# Patient Record
Sex: Male | Born: 1960 | Race: White | Hispanic: No | Marital: Single | Smoking: Former smoker
Health system: Southern US, Community
[De-identification: ages and names within clinical notes are randomized; demographics above are authoritative.]

## PROBLEM LIST (undated history)

## (undated) DIAGNOSIS — J449 Chronic obstructive pulmonary disease, unspecified: Secondary | ICD-10-CM

## (undated) DIAGNOSIS — I639 Cerebral infarction, unspecified: Secondary | ICD-10-CM

## (undated) DIAGNOSIS — C801 Malignant (primary) neoplasm, unspecified: Secondary | ICD-10-CM

## (undated) DIAGNOSIS — J40 Bronchitis, not specified as acute or chronic: Secondary | ICD-10-CM

## (undated) DIAGNOSIS — K219 Gastro-esophageal reflux disease without esophagitis: Secondary | ICD-10-CM

## (undated) DIAGNOSIS — Z95828 Presence of other vascular implants and grafts: Secondary | ICD-10-CM

---
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
## 2003-06-09 ENCOUNTER — Emergency Department (HOSPITAL_COMMUNITY): Admission: EM | Admit: 2003-06-09 | Discharge: 2003-06-09 | Payer: Self-pay | Admitting: Emergency Medicine

## 2004-08-01 ENCOUNTER — Emergency Department (HOSPITAL_COMMUNITY): Admission: EM | Admit: 2004-08-01 | Discharge: 2004-08-01 | Payer: Self-pay | Admitting: Emergency Medicine

## 2004-08-03 ENCOUNTER — Emergency Department (HOSPITAL_COMMUNITY): Admission: EM | Admit: 2004-08-03 | Discharge: 2004-08-03 | Payer: Self-pay | Admitting: Emergency Medicine

## 2004-09-02 ENCOUNTER — Emergency Department (HOSPITAL_COMMUNITY): Admission: EM | Admit: 2004-09-02 | Discharge: 2004-09-02 | Payer: Self-pay | Admitting: *Deleted

## 2005-01-10 ENCOUNTER — Emergency Department (HOSPITAL_COMMUNITY): Admission: EM | Admit: 2005-01-10 | Discharge: 2005-01-10 | Payer: Self-pay | Admitting: Emergency Medicine

## 2005-01-23 ENCOUNTER — Emergency Department (HOSPITAL_COMMUNITY): Admission: EM | Admit: 2005-01-23 | Discharge: 2005-01-23 | Payer: Self-pay | Admitting: Emergency Medicine

## 2005-02-26 ENCOUNTER — Emergency Department (HOSPITAL_COMMUNITY): Admission: EM | Admit: 2005-02-26 | Discharge: 2005-02-27 | Payer: Self-pay | Admitting: *Deleted

## 2005-03-21 ENCOUNTER — Emergency Department (HOSPITAL_COMMUNITY): Admission: EM | Admit: 2005-03-21 | Discharge: 2005-03-21 | Payer: Self-pay | Admitting: Emergency Medicine

## 2005-03-22 ENCOUNTER — Emergency Department (HOSPITAL_COMMUNITY): Admission: EM | Admit: 2005-03-22 | Discharge: 2005-03-22 | Payer: Self-pay | Admitting: Emergency Medicine

## 2005-07-16 ENCOUNTER — Ambulatory Visit (HOSPITAL_COMMUNITY): Admission: RE | Admit: 2005-07-16 | Discharge: 2005-07-16 | Payer: Self-pay | Admitting: Orthopaedic Surgery

## 2005-12-24 ENCOUNTER — Ambulatory Visit (HOSPITAL_COMMUNITY): Admission: RE | Admit: 2005-12-24 | Discharge: 2005-12-24 | Payer: Self-pay | Admitting: Family Medicine

## 2006-02-24 ENCOUNTER — Emergency Department (HOSPITAL_COMMUNITY): Admission: EM | Admit: 2006-02-24 | Discharge: 2006-02-24 | Payer: Self-pay | Admitting: Emergency Medicine

## 2006-06-05 ENCOUNTER — Emergency Department (HOSPITAL_COMMUNITY): Admission: EM | Admit: 2006-06-05 | Discharge: 2006-06-05 | Payer: Self-pay | Admitting: Emergency Medicine

## 2006-09-24 ENCOUNTER — Ambulatory Visit (HOSPITAL_COMMUNITY): Admission: RE | Admit: 2006-09-24 | Discharge: 2006-09-24 | Payer: Self-pay | Admitting: Family Medicine

## 2006-10-06 ENCOUNTER — Emergency Department (HOSPITAL_COMMUNITY): Admission: EM | Admit: 2006-10-06 | Discharge: 2006-10-07 | Payer: Self-pay | Admitting: Emergency Medicine

## 2006-11-05 ENCOUNTER — Emergency Department (HOSPITAL_COMMUNITY): Admission: EM | Admit: 2006-11-05 | Discharge: 2006-11-05 | Payer: Self-pay | Admitting: Emergency Medicine

## 2006-12-01 ENCOUNTER — Emergency Department (HOSPITAL_COMMUNITY): Admission: EM | Admit: 2006-12-01 | Discharge: 2006-12-01 | Payer: Self-pay | Admitting: Emergency Medicine

## 2013-01-30 ENCOUNTER — Other Ambulatory Visit (HOSPITAL_COMMUNITY): Payer: Self-pay | Admitting: Family Medicine

## 2013-01-30 DIAGNOSIS — R319 Hematuria, unspecified: Secondary | ICD-10-CM

## 2013-01-30 DIAGNOSIS — R109 Unspecified abdominal pain: Secondary | ICD-10-CM

## 2013-01-30 DIAGNOSIS — R1030 Lower abdominal pain, unspecified: Secondary | ICD-10-CM

## 2013-01-31 ENCOUNTER — Ambulatory Visit (HOSPITAL_COMMUNITY)
Admission: RE | Admit: 2013-01-31 | Discharge: 2013-01-31 | Disposition: A | Discharge status: Home / Self Care | Payer: BC Managed Care – PPO | Source: Ambulatory Visit | Attending: Family Medicine | Admitting: Family Medicine

## 2013-01-31 DIAGNOSIS — N200 Calculus of kidney: Secondary | ICD-10-CM | POA: Insufficient documentation

## 2013-01-31 DIAGNOSIS — R319 Hematuria, unspecified: Secondary | ICD-10-CM

## 2013-01-31 DIAGNOSIS — R109 Unspecified abdominal pain: Secondary | ICD-10-CM | POA: Insufficient documentation

## 2013-01-31 DIAGNOSIS — R1030 Lower abdominal pain, unspecified: Secondary | ICD-10-CM

## 2013-03-26 ENCOUNTER — Emergency Department (HOSPITAL_COMMUNITY)
Admission: EM | Admit: 2013-03-26 | Discharge: 2013-03-26 | Disposition: A | Discharge status: Home / Self Care | Payer: BC Managed Care – PPO | Attending: Emergency Medicine | Admitting: Emergency Medicine

## 2013-03-26 ENCOUNTER — Encounter (HOSPITAL_COMMUNITY): Payer: Self-pay

## 2013-03-26 DIAGNOSIS — R51 Headache: Secondary | ICD-10-CM | POA: Insufficient documentation

## 2013-03-26 DIAGNOSIS — J3489 Other specified disorders of nose and nasal sinuses: Secondary | ICD-10-CM | POA: Insufficient documentation

## 2013-03-26 DIAGNOSIS — R059 Cough, unspecified: Secondary | ICD-10-CM | POA: Insufficient documentation

## 2013-03-26 DIAGNOSIS — F17200 Nicotine dependence, unspecified, uncomplicated: Secondary | ICD-10-CM | POA: Insufficient documentation

## 2013-03-26 DIAGNOSIS — R05 Cough: Secondary | ICD-10-CM | POA: Insufficient documentation

## 2013-03-26 MED ORDER — PSEUDOEPHEDRINE HCL 60 MG PO TABS: 60.0000 mg | ORAL_TABLET | Freq: Four times a day (QID) | ORAL | Status: DC | PRN

## 2013-03-26 MED ORDER — PREDNISONE 10 MG PO TABS: ORAL_TABLET | ORAL | Status: DC

## 2013-03-26 MED ORDER — KETOROLAC TROMETHAMINE 10 MG PO TABS
10.0000 mg | ORAL_TABLET | Freq: Once | ORAL | Status: AC
  Administered 2013-03-26: 10 mg via ORAL
  Filled 2013-03-26: qty 1

## 2013-03-26 MED ORDER — PSEUDOEPHEDRINE HCL 60 MG PO TABS
60.0000 mg | ORAL_TABLET | Freq: Once | ORAL | Status: AC
  Administered 2013-03-26: 60 mg via ORAL
  Filled 2013-03-26: qty 1

## 2013-03-26 MED ORDER — HYDROCODONE-ACETAMINOPHEN 7.5-325 MG PO TABS: 1.0000 | ORAL_TABLET | ORAL | Status: DC | PRN

## 2013-03-26 MED ORDER — PREDNISONE 50 MG PO TABS
60.0000 mg | ORAL_TABLET | Freq: Once | ORAL | Status: AC
  Administered 2013-03-26: 60 mg via ORAL
  Filled 2013-03-26: qty 1

## 2013-03-26 NOTE — ED Provider Notes (Signed)
History     CSN: [id number]  Arrival date & time 03/26/13  0814   First MD Initiated Contact with Patient 03/26/13 [phone]      Chief Complaint  Patient presents with  . Headache    (Consider location/radiation/quality/duration/timing/severity/associated sxs/prior treatment) Patient is a 52 y.o. male presenting with headaches. The history is provided by the patient.  Headache Pain location:  Frontal Quality:  Dull Radiates to:  Does not radiate Severity currently:  8/10 Severity at highest:  8/10 Onset quality:  Gradual Duration:  2 days Timing:  Intermittent Progression:  Worsening Chronicity:  New Similar to prior headaches: yes   Context comment:  Change in temperature Relieved by:  Nothing Worsened by:  Nothing tried Ineffective treatments:  NSAIDs Associated symptoms: congestion and cough   Associated symptoms: no abdominal pain, no back pain, no dizziness, no fever, no loss of balance, no near-syncope, no neck pain, no photophobia and no seizures   Risk factors: no family hx of SAH and lifestyle not sedentary     History reviewed. No pertinent past medical history.  History reviewed. No pertinent past surgical history.  No family history on file.  History  Substance Use Topics  . Smoking status: Current Every Day Smoker  . Smokeless tobacco: Not on file  . Alcohol Use: No      Review of Systems  Constitutional: Negative for fever and activity change.       All ROS Neg except as noted in HPI  HENT: Positive for congestion. Negative for nosebleeds and neck pain.   Eyes: Negative for photophobia and discharge.  Respiratory: Positive for cough. Negative for shortness of breath and wheezing.   Cardiovascular: Negative for chest pain, palpitations and near-syncope.  Gastrointestinal: Negative for abdominal pain and blood in stool.  Genitourinary: Negative for dysuria, frequency and hematuria.  Musculoskeletal: Negative for back pain and arthralgias.  Skin:  Negative.   Neurological: Positive for headaches. Negative for dizziness, seizures, speech difficulty and loss of balance.  Psychiatric/Behavioral: Negative for hallucinations and confusion.    Allergies  Review of patient's allergies indicates no known allergies.  Home Medications  No current outpatient prescriptions on file.  BP 99/85  Pulse 111  Temp(Src) 97.7 F (36.5 C) (Oral)  Resp 18  Ht 5\' 9"  (1.753 m)  Wt 125 lb (56.7 kg)  BMI 18.45 kg/m2  SpO2 99%  Physical Exam  Nursing note and vitals reviewed. Constitutional: He is oriented to person, place, and time. He appears well-developed and well-nourished.  Non-toxic appearance.  HENT:  Head: Normocephalic.  Right Ear: Tympanic membrane and external ear normal.  Left Ear: Tympanic membrane and external ear normal.  Mouth/Throat: Oropharynx is clear and moist.  Nasal congestion noted. Pain to percussion of the sinuses. No hot areas noted.  Eyes: EOM and lids are normal. Pupils are equal, round, and reactive to light. Right eye exhibits no discharge. Left eye exhibits no discharge.  Neck: Normal range of motion. Neck supple. Carotid bruit is not present.  Cardiovascular: Normal rate, regular rhythm, normal heart sounds, intact distal pulses and normal pulses.   Pulmonary/Chest: Breath sounds normal. No respiratory distress.  Abdominal: Soft. Bowel sounds are normal. There is no tenderness. There is no guarding.  Musculoskeletal: Normal range of motion.  Lymphadenopathy:       Head (right side): No submandibular adenopathy present.       Head (left side): No submandibular adenopathy present.    He has no cervical adenopathy.  Neurological: He  is alert and oriented to person, place, and time. He has normal strength. No cranial nerve deficit or sensory deficit.  Skin: Skin is warm and dry.  Psychiatric: He has a normal mood and affect. His speech is normal.    ED Course  Procedures (including critical care time)  Labs  Reviewed - No data to display No results found.   No diagnosis found.    MDM  I have reviewed nursing notes, vital signs, and all appropriate lab and imaging results for this patient. Pt presents to ED with 2 days of frontal headache. Has hx of sinus related problems. C/o pressure sensation. No blurred vision, double vision, facial weakness, N/V, change in gait or near syncope, or LOC. Exam reveal pain to percussion of the sinuses. No gross neuro deficit. Plan - Prednisone, sudafed, and Rx for Norco for pain. Pt referred to Dr Suszanne Conners if not improving.       Kathie Dike, PA-C 03/26/13 [phone]

## 2013-03-26 NOTE — ED Notes (Signed)
Pt c/o sinus pressure x 2 days.  

## 2013-03-26 NOTE — ED Provider Notes (Signed)
Medical screening examination/treatment/procedure(s) were performed by non-physician practitioner and as supervising physician I was immediately available for consultation/collaboration.   Glynn Octave, MD 03/26/13 [phone]

## 2013-04-29 ENCOUNTER — Encounter (HOSPITAL_COMMUNITY): Payer: Self-pay | Admitting: *Deleted

## 2013-04-29 ENCOUNTER — Emergency Department (HOSPITAL_COMMUNITY)
Admission: EM | Admit: 2013-04-29 | Discharge: 2013-04-29 | Disposition: A | Discharge status: Home / Self Care | Payer: BC Managed Care – PPO | Attending: Emergency Medicine | Admitting: Emergency Medicine

## 2013-04-29 ENCOUNTER — Emergency Department (HOSPITAL_COMMUNITY): Payer: BC Managed Care – PPO

## 2013-04-29 DIAGNOSIS — F17200 Nicotine dependence, unspecified, uncomplicated: Secondary | ICD-10-CM | POA: Insufficient documentation

## 2013-04-29 DIAGNOSIS — R51 Headache: Secondary | ICD-10-CM | POA: Insufficient documentation

## 2013-04-29 DIAGNOSIS — Y9389 Activity, other specified: Secondary | ICD-10-CM | POA: Insufficient documentation

## 2013-04-29 DIAGNOSIS — Y9289 Other specified places as the place of occurrence of the external cause: Secondary | ICD-10-CM | POA: Insufficient documentation

## 2013-04-29 DIAGNOSIS — IMO0002 Reserved for concepts with insufficient information to code with codable children: Secondary | ICD-10-CM | POA: Insufficient documentation

## 2013-04-29 DIAGNOSIS — Z880 Allergy status to penicillin: Secondary | ICD-10-CM | POA: Insufficient documentation

## 2013-04-29 DIAGNOSIS — S62609B Fracture of unspecified phalanx of unspecified finger, initial encounter for open fracture: Secondary | ICD-10-CM

## 2013-04-29 DIAGNOSIS — Z79899 Other long term (current) drug therapy: Secondary | ICD-10-CM | POA: Insufficient documentation

## 2013-04-29 DIAGNOSIS — W2209XA Striking against other stationary object, initial encounter: Secondary | ICD-10-CM | POA: Insufficient documentation

## 2013-04-29 MED ORDER — IBUPROFEN 800 MG PO TABS
800.0000 mg | ORAL_TABLET | Freq: Once | ORAL | Status: AC
  Administered 2013-04-29: 800 mg via ORAL
  Filled 2013-04-29: qty 1

## 2013-04-29 MED ORDER — HYDROCODONE-ACETAMINOPHEN 5-325 MG PO TABS: 1.0000 | ORAL_TABLET | ORAL | Status: DC | PRN

## 2013-04-29 MED ORDER — HYDROCODONE-ACETAMINOPHEN 5-325 MG PO TABS
1.0000 | ORAL_TABLET | Freq: Once | ORAL | Status: AC
Start: 2013-04-29 — End: 2013-04-29
  Administered 2013-04-29: 1 via ORAL
  Filled 2013-04-29: qty 1

## 2013-04-29 MED ORDER — SULFAMETHOXAZOLE-TRIMETHOPRIM 800-160 MG PO TABS: 1.0000 | ORAL_TABLET | Freq: Two times a day (BID) | ORAL | Status: AC

## 2013-04-29 NOTE — ED Notes (Signed)
Pt was putting shingles on a roof and hit his left 5th digit with a hammer. Bruising, swelling to left finger along with small lac to underside of finger. NAD

## 2013-04-29 NOTE — ED Provider Notes (Signed)
History    CSN: [id number] Arrival date & time 04/29/13  0136  First MD Initiated Contact with Patient 04/29/13 0150     Chief Complaint  Patient presents with  . Headache   (Consider location/radiation/quality/duration/timing/severity/associated sxs/prior Treatment) HPI HPI Comments: Paul Thornton is a 52 y.o. male who presents to the Emergency Department complaining of a frontal headache that began at 2300 when he woke up. He took an aspirin without relief. He has had similar headaches in the past, last seen here on 03/26/13. He has not followed up with an ENT.  PCP Dr. Renard Matter  History reviewed. No pertinent past medical history. History reviewed. No pertinent past surgical history. No family history on file. History  Substance Use Topics  . Smoking status: Current Every Day Smoker  . Smokeless tobacco: Not on file  . Alcohol Use: No    Review of Systems  Constitutional: Negative for fever.       10 Systems reviewed and are negative for acute change except as noted in the HPI.  HENT: Negative for congestion.   Eyes: Negative for discharge and redness.  Respiratory: Negative for cough and shortness of breath.   Cardiovascular: Negative for chest pain.  Gastrointestinal: Negative for vomiting and abdominal pain.  Musculoskeletal: Negative for back pain.  Skin: Negative for rash.  Neurological: Positive for headaches. Negative for syncope and numbness.  Psychiatric/Behavioral:       No behavior change.    Allergies  Penicillins  Home Medications   Current Outpatient Rx  Name  Route  Sig  Dispense  Refill  . ALPRAZolam (XANAX) 1 MG tablet   Oral   Take 1 mg by mouth as needed for sleep.         Marland Kitchen HYDROcodone-acetaminophen (NORCO) 7.5-325 MG per tablet   Oral   Take 1 tablet by mouth every 4 (four) hours as needed for pain.   20 tablet   0   . predniSONE (DELTASONE) 10 MG tablet      5,4,3,2,1 - Take with food   15 tablet   0   . pseudoephedrine  (SUDAFED) 60 MG tablet   Oral   Take 1 tablet (60 mg total) by mouth every 6 (six) hours as needed for congestion.   30 tablet   0    There were no vitals taken for this visit. Physical Exam  Nursing note and vitals reviewed. Constitutional: He appears well-developed and well-nourished.  Awake, alert, nontoxic appearance.  HENT:  Head: Normocephalic and atraumatic.  Right Ear: External ear normal.  Left Ear: External ear normal.  Eyes: EOM are normal. Pupils are equal, round, and reactive to light.  Neck: Normal range of motion. Neck supple.  Cardiovascular: Normal rate and intact distal pulses.   Pulmonary/Chest: Effort normal and breath sounds normal. He exhibits no tenderness.  Abdominal: Soft. Bowel sounds are normal. There is no tenderness. There is no rebound.  Musculoskeletal: He exhibits no tenderness.  Baseline ROM, no obvious new focal weakness.  Neurological:  Mental status and motor strength appears baseline for patient and situation.  Skin: No rash noted.  Psychiatric: He has a normal mood and affect.    ED Course  Procedures (including critical care time) Medications  HYDROcodone-acetaminophen (NORCO/VICODIN) 5-325 MG per tablet 1 tablet (1 tablet Oral Given 04/29/13 0156)  ibuprofen (ADVIL,MOTRIN) tablet 800 mg (800 mg Oral Given 04/29/13 0156)     0246 Headache has resolved.  MDM  Patient presents with a headache that  began at 2300 and has not responded to aspirin. Given ibuprofen and Vicodin with relief. Pt stable in ED with no significant deterioration in condition.The patient appears reasonably screened and/or stabilized for discharge and I doubt any other medical condition or other [facility] requiring further screening, evaluation, or treatment in the ED at this time prior to discharge.  MDM Reviewed: nursing note and vitals     Nicoletta Dress. Colon Branch, MD 04/29/13 [phone]

## 2013-04-29 NOTE — ED Notes (Signed)
Pt states was woke w/ a headache about 2300 yesterday. Has not taken anything for before coming to the ER.

## 2013-04-30 NOTE — ED Provider Notes (Signed)
History    CSN: [id number] Arrival date & time 04/29/13  1228  First MD Initiated Contact with Patient 04/29/13 1257     Chief Complaint  Patient presents with  . Hand Injury   (Consider location/radiation/quality/duration/timing/severity/associated sxs/prior Treatment) Patient is a 52 y.o. male presenting with hand injury. The history is provided by the patient.  Hand Injury Location:  Finger Time since incident:  1 hour Injury: yes   Mechanism of injury comment:  Direct blow by hammer as he was nailing wooden shingles on a roof. Finger location:  L little finger Pain details:    Quality:  Aching   Radiates to:  Does not radiate   Onset quality:  Sudden   Timing:  Constant   Progression:  Unchanged Chronicity:  New Handedness:  Right-handed Dislocation: no   Foreign body present:  No foreign bodies Tetanus status:  Up to date Relieved by:  Nothing Worsened by:  Movement Ineffective treatments:  None tried Associated symptoms: decreased range of motion, swelling and tingling   Associated symptoms: no fever and no numbness   Associated symptoms comment:  He has deep abrasion on the volar side of his 5th finger at the pip joint,  Hemostatic at this time.  History reviewed. No pertinent past medical history. History reviewed. No pertinent past surgical history. No family history on file. History  Substance Use Topics  . Smoking status: Current Some Day Smoker  . Smokeless tobacco: Not on file  . Alcohol Use: No    Review of Systems  Constitutional: Negative for fever.  Musculoskeletal: Positive for joint swelling and arthralgias. Negative for myalgias.  Skin: Positive for wound.  Neurological: Negative for weakness and numbness.    Allergies  Penicillins  Home Medications   Current Outpatient Rx  Name  Route  Sig  Dispense  Refill  . ALPRAZolam (XANAX) 1 MG tablet   Oral   Take 0.5-1 mg by mouth at bedtime as needed for sleep.          Marland Kitchen  HYDROcodone-acetaminophen (NORCO/VICODIN) 5-325 MG per tablet   Oral   Take 1 tablet by mouth every 4 (four) hours as needed for pain.   20 tablet   0   . sulfamethoxazole-trimethoprim (BACTRIM DS,SEPTRA DS) 800-160 MG per tablet   Oral   Take 1 tablet by mouth 2 (two) times daily.   14 tablet   0    BP 98/69  Pulse 80  Temp(Src) 97.5 F (36.4 C) (Oral)  Resp 20  Ht 5\' 9"  (1.753 m)  Wt 130 lb (58.968 kg)  BMI 19.19 kg/m2  SpO2 97% Physical Exam  Constitutional: He appears well-developed and well-nourished.  HENT:  Head: Atraumatic.  Neck: Normal range of motion.  Cardiovascular:  Pulses equal bilaterally  Musculoskeletal: He exhibits tenderness.       Left hand: He exhibits tenderness and swelling. He exhibits normal capillary refill and no deformity. Decreased strength noted. He exhibits finger abduction.  Swelling,  Ecchymosis proximal phalanx left 5th finger.  There is a small deep abrasion, 3 mm volar pip joint.  No fb.  Distal cap refill less than 3 sec.  Pt can flex distal finger, but painful.  Decreased sensation to fine touch distally, but present.  Neurological: He is alert. He has normal strength. He displays normal reflexes. No sensory deficit.  Equal strength  Skin: Skin is warm and dry.  Psychiatric: He has a normal mood and affect.    ED Course  Procedures (  including critical care time) Labs Reviewed - No data to display Dg Finger Little Left  04/29/2013   *RADIOLOGY REPORT*  Clinical Data: Fifth digit pain, injury with hammer  LEFT LITTLE FINGER 2+V  Comparison: None.  Findings: Comminuted, nondisplaced fracture involving the fifth middle phalanx.  No definite interarticular extension.  Associated soft tissue swelling overlying the PIP joint.  IMPRESSION: Comminuted, nondisplaced fracture involving the fifth middle phalanx.   Original Report Authenticated By: Charline Bills, M.D.   1. Phalanx, hand fracture, open, initial encounter     MDM  Patients  labs and/or radiological studies were viewed and considered during the medical decision making and disposition process. Discussed with Dr. Clarene Duke prior to pt dispo home.  Don't believe this is truly an open fracture,  Skin injury is negligible.  Will cover for this possibility however,  Placed on bactrim,  Also prescribed hydrocodone,  Ice , elevation,  Recheck by Dr. Hilda Lias on Monday.  No ortho on call here this weekend,  Also given Dr. Debby Bud number for referral if Dr. Hilda Lias not available Monday,  But pt has seen him before,  And prefers to f/u with him.  Wound was cleaned,  telfa dressing and cling applied,  Finger splint and buddy tape to the 4th finger to protect the bony injury.  Tetanus is utd.  Wound care instructions given.  Return here  for any signs of infection including redness, swelling, worse pain or drainage of pus.     Burgess Amor, PA-C 04/30/13 0900

## 2013-05-02 NOTE — ED Provider Notes (Signed)
Medical screening examination/treatment/procedure(s) were performed by non-physician practitioner and as supervising physician I was immediately available for consultation/collaboration.   Laray Anger, DO 05/02/13 1845

## 2013-08-20 ENCOUNTER — Encounter (HOSPITAL_COMMUNITY): Payer: Self-pay | Admitting: Emergency Medicine

## 2013-08-20 ENCOUNTER — Emergency Department (HOSPITAL_COMMUNITY)
Admission: EM | Admit: 2013-08-20 | Discharge: 2013-08-20 | Disposition: A | Discharge status: Home / Self Care | Payer: BC Managed Care – PPO | Attending: Emergency Medicine | Admitting: Emergency Medicine

## 2013-08-20 DIAGNOSIS — Z23 Encounter for immunization: Secondary | ICD-10-CM | POA: Insufficient documentation

## 2013-08-20 DIAGNOSIS — Y939 Activity, unspecified: Secondary | ICD-10-CM | POA: Insufficient documentation

## 2013-08-20 DIAGNOSIS — Z880 Allergy status to penicillin: Secondary | ICD-10-CM | POA: Insufficient documentation

## 2013-08-20 DIAGNOSIS — Y929 Unspecified place or not applicable: Secondary | ICD-10-CM | POA: Insufficient documentation

## 2013-08-20 DIAGNOSIS — W268XXA Contact with other sharp object(s), not elsewhere classified, initial encounter: Secondary | ICD-10-CM | POA: Insufficient documentation

## 2013-08-20 DIAGNOSIS — S60459A Superficial foreign body of unspecified finger, initial encounter: Secondary | ICD-10-CM | POA: Insufficient documentation

## 2013-08-20 DIAGNOSIS — F17200 Nicotine dependence, unspecified, uncomplicated: Secondary | ICD-10-CM | POA: Insufficient documentation

## 2013-08-20 MED ORDER — LIDOCAINE HCL (PF) 1 % IJ SOLN
INTRAMUSCULAR | Status: AC
  Administered 2013-08-20: 12:00:00
  Filled 2013-08-20: qty 5

## 2013-08-20 MED ORDER — CEPHALEXIN 500 MG PO CAPS: ORAL_CAPSULE | ORAL | Status: DC

## 2013-08-20 MED ORDER — TETANUS-DIPHTH-ACELL PERTUSSIS 5-2.5-18.5 LF-MCG/0.5 IM SUSP
0.5000 mL | Freq: Once | INTRAMUSCULAR | Status: AC
  Administered 2013-08-20: 0.5 mL via INTRAMUSCULAR
  Filled 2013-08-20: qty 0.5

## 2013-08-20 MED ORDER — HYDROCODONE-ACETAMINOPHEN 5-325 MG PO TABS: 2.0000 | ORAL_TABLET | ORAL | Status: DC | PRN

## 2013-08-20 NOTE — ED Notes (Signed)
Pt has fish hook stuck in left index finger. Nad. No s/s of pain at this time unless touched. rom wnl. Color wnl.

## 2013-08-20 NOTE — ED Provider Notes (Signed)
CSN: [id number]     Arrival date & time 08/20/13  1108 History  This chart was scribed for Hurman Horn, MD by Blanchard Kelch, ED Scribe. The patient was seen in room APFT23/APFT23. Patient's care was started at 12:04 PM.     Chief Complaint  Patient presents with  . Puncture Wound    The history is provided by the patient. No language interpreter was used.    HPI Comments: Paul Thornton is a 52 y.o. male who presents to the Emergency Department due to a fish hook stuck in his left index finger that occurred just prior to arrival. He has constant, stinging pain to the area onset immediately after the accident.       History reviewed. No pertinent past medical history. History reviewed. No pertinent past surgical history. History reviewed. No pertinent family history. History  Substance Use Topics  . Smoking status: Current Some Day Smoker  . Smokeless tobacco: Not on file  . Alcohol Use: No    Review of Systems 10 Systems reviewed and are negative for acute change except as noted in the HPI  Allergies  Amoxicillin and Penicillins  Home Medications   Current Outpatient Rx  Name  Route  Sig  Dispense  Refill  . ALPRAZolam (XANAX) 1 MG tablet   Oral   Take 0.5-1 mg by mouth at bedtime as needed for sleep.          . cephALEXin (KEFLEX) 500 MG capsule      2 caps po bid x 7 days   28 capsule   0   . HYDROcodone-acetaminophen (NORCO) 5-325 MG per tablet   Oral   Take 2 tablets by mouth every 4 (four) hours as needed for pain.   6 tablet   0    Triage Vitals: BP 110/65  Pulse 91  Temp(Src) 97.5 F (36.4 C) (Oral)  Resp 22  Ht 5\' 9"  (1.753 m)  Wt 112 lb (50.803 kg)  BMI 16.53 kg/m2  SpO2 100%  Physical Exam  Nursing note and vitals reviewed. Constitutional:  Awake, alert, nontoxic appearance.  HENT:  Head: Atraumatic.  Eyes: Right eye exhibits no discharge. Left eye exhibits no discharge.  Neck: Neck supple.  Pulmonary/Chest: Effort normal. He  exhibits no tenderness.  Abdominal: Soft. There is no tenderness. There is no rebound.  Musculoskeletal: He exhibits tenderness.  Baseline ROM, no obvious new focal weakness. Left index finger volar pad impaled with fish hook. CR < 2 sec and 2 PDI, FROM. Intact FDP/extension.    Neurological:  Mental status and motor strength appears baseline for patient and situation.  Skin: No rash noted.  Psychiatric: He has a normal mood and affect.    ED Course  Procedures (including critical care time) FB removal left index finger distal volar pad: Timeout taken, wounds cleansed with Chlorprep and Safclens, 1% lidocaine, local 2ml. Fish hook removed via pushing garbage tip through skin then snipping off the barbed tip with wire cutters, then backing out remainder of the fishhook, patient tolerated procedure well without apparent immediate complications. DIAGNOSTIC STUDIES: Oxygen Saturation is 100% on room air, normal by my interpretation.    COORDINATION OF CARE: 12:10 PM -Removed hook from patient's finger. Discussed reasons to return for recheck. Will discharge. Patient verbalizes understanding and agrees with treatment plan.    Labs Review Labs Reviewed - No data to display Imaging Review No results found.  EKG Interpretation   None  MDM   1. Foreign body of finger of left hand, initial encounter    I personally performed the services described in this documentation, which was scribed in my presence. The recorded information has been reviewed and is accurate.    Hurman Horn, MD 08/27/13 2105

## 2014-01-28 ENCOUNTER — Emergency Department (HOSPITAL_COMMUNITY): Payer: BC Managed Care – PPO

## 2014-01-28 ENCOUNTER — Encounter (HOSPITAL_COMMUNITY): Payer: Self-pay | Admitting: Emergency Medicine

## 2014-01-28 ENCOUNTER — Emergency Department (HOSPITAL_COMMUNITY)
Admission: EM | Admit: 2014-01-28 | Discharge: 2014-01-28 | Disposition: A | Discharge status: Home / Self Care | Payer: BC Managed Care – PPO | Attending: Emergency Medicine | Admitting: Emergency Medicine

## 2014-01-28 DIAGNOSIS — J441 Chronic obstructive pulmonary disease with (acute) exacerbation: Secondary | ICD-10-CM | POA: Insufficient documentation

## 2014-01-28 DIAGNOSIS — F17200 Nicotine dependence, unspecified, uncomplicated: Secondary | ICD-10-CM | POA: Insufficient documentation

## 2014-01-28 DIAGNOSIS — Z79899 Other long term (current) drug therapy: Secondary | ICD-10-CM | POA: Insufficient documentation

## 2014-01-28 DIAGNOSIS — R079 Chest pain, unspecified: Secondary | ICD-10-CM | POA: Insufficient documentation

## 2014-01-28 DIAGNOSIS — Z880 Allergy status to penicillin: Secondary | ICD-10-CM | POA: Insufficient documentation

## 2014-01-28 DIAGNOSIS — J449 Chronic obstructive pulmonary disease, unspecified: Secondary | ICD-10-CM

## 2014-01-28 HISTORY — DX: Bronchitis, not specified as acute or chronic: J40

## 2014-01-28 MED ORDER — IPRATROPIUM BROMIDE 0.02 % IN SOLN: 0.5000 mg | Freq: Once | RESPIRATORY_TRACT | Status: DC

## 2014-01-28 MED ORDER — IPRATROPIUM-ALBUTEROL 0.5-2.5 (3) MG/3ML IN SOLN: 3.0000 mL | RESPIRATORY_TRACT | Status: DC

## 2014-01-28 MED ORDER — ALBUTEROL SULFATE HFA 108 (90 BASE) MCG/ACT IN AERS: 1.0000 | INHALATION_SPRAY | Freq: Four times a day (QID) | RESPIRATORY_TRACT | Status: DC | PRN

## 2014-01-28 MED ORDER — ALBUTEROL SULFATE (2.5 MG/3ML) 0.083% IN NEBU: 2.5000 mg | INHALATION_SOLUTION | Freq: Once | RESPIRATORY_TRACT | Status: DC

## 2014-01-28 MED ORDER — PREDNISONE 50 MG PO TABS
60.0000 mg | ORAL_TABLET | Freq: Once | ORAL | Status: AC
  Administered 2014-01-28: 60 mg via ORAL
  Filled 2014-01-28 (×2): qty 1

## 2014-01-28 MED ORDER — IPRATROPIUM-ALBUTEROL 0.5-2.5 (3) MG/3ML IN SOLN
3.0000 mL | Freq: Once | RESPIRATORY_TRACT | Status: AC
  Administered 2014-01-28: 3 mL via RESPIRATORY_TRACT
  Filled 2014-01-28: qty 3

## 2014-01-28 MED ORDER — PREDNISONE 50 MG PO TABS: ORAL_TABLET | ORAL | Status: DC

## 2014-01-28 MED ORDER — ALBUTEROL SULFATE HFA 108 (90 BASE) MCG/ACT IN AERS
2.0000 | INHALATION_SPRAY | Freq: Once | RESPIRATORY_TRACT | Status: DC
  Filled 2014-01-28: qty 6.7

## 2014-01-28 NOTE — ED Notes (Signed)
Coughing x 1 week w/escalation of symptoms this evening.  SOB w/rest and pain in lower thoracic region of back worsening w/deep breathing. Decreased energy over last 2 days.

## 2014-01-28 NOTE — ED Provider Notes (Signed)
CSN: [id number]     Arrival date & time 01/28/14  2147 History  This chart was scribed for Paul Cable, MD by Jenne Campus, ED Scribe. This patient was seen in room APA11/APA11 and the patient's care was started at 10:17 PM.   Chief Complaint  Patient presents with  . Shortness of Breath  . Cough    Patient is a 53 y.o. male presenting with cough. The history is provided by the patient. No language interpreter was used.  Cough Cough characteristics:  Non-productive Severity:  Moderate Onset quality:  Gradual Duration:  3 days Progression:  Worsening Chronicity:  New Smoker: yes   Associated symptoms: chest pain and shortness of breath     HPI Comments: Paul Thornton is a 53 y.o. male who presents to the Emergency Department complaining of NP coughing with associated SOB for the past 3 days. He also reports burning CP from coughing. The symptoms became worse this evening around 5 PM this evening causing him concern. He has taken OTC medications like Alka-Seltzer plus without improvement. He denies any abdominal pain or leg swelling as associated symptoms. He denies any history of chronic medical conditions or being on any daily medications. Pt is a current everyday smoker.   Past Medical History  Diagnosis Date  . Bronchitis    History reviewed. No pertinent past surgical history. History reviewed. No pertinent family history. History  Substance Use Topics  . Smoking status: Current Every Day Smoker -- 0.25 packs/day  . Smokeless tobacco: Not on file  . Alcohol Use: No    Review of Systems  Respiratory: Positive for cough and shortness of breath.   Cardiovascular: Positive for chest pain. Negative for leg swelling.  Gastrointestinal: Negative for abdominal pain.  All other systems reviewed and are negative.   Allergies  Amoxicillin and Penicillins  Home Medications   Current Outpatient Rx  Name  Route  Sig  Dispense  Refill  . ALPRAZolam (XANAX) 1 MG  tablet   Oral   Take 0.5-1 mg by mouth daily as needed for anxiety or sleep.          Marland Kitchen Phenyleph-CPM-DM-Aspirin (ALKA-SELTZER PLUS COLD & COUGH) 7.05-27-09-325 MG TBEF   Oral   Take 1 tablet by mouth daily as needed (for cold symptoms).          Triage Vitals: BP 126/67  Pulse 71  Temp(Src) 97.9 F (36.6 C) (Oral)  Resp 24  Ht 5\' 9"  (1.753 m)  Wt 120 lb (54.432 kg)  BMI 17.71 kg/m2  SpO2 100%  Physical Exam  Nursing note and vitals reviewed.  CONSTITUTIONAL: Well developed/well nourished HEAD: Normocephalic/atraumatic EYES: EOMI/PERRL ENMT: Mucous membranes moist NECK: supple no meningeal signs SPINE:entire spine nontender CV: S1/S2 noted, no murmurs/rubs/gallops noted LUNGS: wheezing bilaterally, no apparent distress ABDOMEN: soft, nontender, no rebound or guarding GU:no cva tenderness NEURO: Pt is awake/alert, moves all extremitiesx4 EXTREMITIES: pulses normal, full ROM, no LE edema SKIN: warm, color normal PSYCH: no abnormalities of mood noted  ED Course  Procedures   Medications  albuterol (PROVENTIL HFA;VENTOLIN HFA) 108 (90 BASE) MCG/ACT inhaler 2 puff (not administered)  ipratropium-albuterol (DUONEB) 0.5-2.5 (3) MG/3ML nebulizer solution 3 mL (3 mLs Nebulization Given 01/28/14 2213)  predniSONE (DELTASONE) tablet 60 mg (60 mg Oral Given 01/28/14 2239)    DIAGNOSTIC STUDIES: Oxygen Saturation is 100% on RA, normal by my interpretation.    COORDINATION OF CARE: 10:19 PM-Smoking cessation advised. Discussed treatment plan which includes breathing treatment and CXR  with pt at bedside and pt agreed to plan.   Pt improved His wheeze essentially resolved He ambulated in the ED and was in no distress, denied CP and denies dyspnea on exertion Given CXR appearance and history I suspect pt has COPD and I told him about this Stressed need to quit smoking He will f/u with PCP  Given cough/wheeze, I feel ACS/PE/CHF is less likely at this time  Imaging Review Dg  Chest 2 View  01/28/2014   CLINICAL DATA:  Shortness of breath and cough with fever and congestion 1 week.  EXAM: CHEST  2 VIEW  COMPARISON:  None.  FINDINGS: The lungs are hyperexpanded without focal consolidation or effusion. There is flattening of the hemidiaphragms on the lateral film. Cardiomediastinal silhouette is within normal. Remaining bones soft tissues are unremarkable.  IMPRESSION: No acute cardiopulmonary disease.  COPD.   Electronically Signed   By: Marin Olp M.D.   On: 01/28/2014 22:55     EKG Interpretation   Date/Time:  Sunday January 28 2014 22:45:44 EDT Ventricular Rate:  70 PR Interval:  134 QRS Duration: 102 QT Interval:  402 QTC Calculation: 434 R Axis:   74 Text Interpretation:  Normal sinus rhythm Normal ECG No previous ECGs  available Confirmed by Christy Gentles  MD, Elenore Rota ([id number]) on 01/28/2014 10:54:46  PM      MDM   Final diagnoses:  COPD (chronic obstructive pulmonary disease)    Nursing notes including past medical history and social history reviewed and considered in documentation xrays reviewed and considered  I personally performed the services described in this documentation, which was scribed in my presence. The recorded information has been reviewed and is accurate.        Paul Cable, MD 01/29/14 [phone]

## 2019-04-19 ENCOUNTER — Emergency Department (HOSPITAL_COMMUNITY): Payer: Medicaid Other

## 2019-04-19 ENCOUNTER — Other Ambulatory Visit: Payer: Self-pay

## 2019-04-19 ENCOUNTER — Encounter (HOSPITAL_COMMUNITY): Payer: Self-pay | Admitting: Emergency Medicine

## 2019-04-19 ENCOUNTER — Inpatient Hospital Stay (HOSPITAL_COMMUNITY)
Admission: EM | Admit: 2019-04-19 | Discharge: 2019-04-26 | DRG: 518 | Disposition: A | Discharge status: Rehabilitation Facility | Payer: Medicaid Other | Attending: Neurological Surgery | Admitting: Neurological Surgery

## 2019-04-19 DIAGNOSIS — Z20828 Contact with and (suspected) exposure to other viral communicable diseases: Secondary | ICD-10-CM | POA: Diagnosis present

## 2019-04-19 DIAGNOSIS — J441 Chronic obstructive pulmonary disease with (acute) exacerbation: Secondary | ICD-10-CM | POA: Diagnosis present

## 2019-04-19 DIAGNOSIS — Z681 Body mass index (BMI) 19 or less, adult: Secondary | ICD-10-CM | POA: Diagnosis not present

## 2019-04-19 DIAGNOSIS — Z419 Encounter for procedure for purposes other than remedying health state, unspecified: Secondary | ICD-10-CM

## 2019-04-19 DIAGNOSIS — K219 Gastro-esophageal reflux disease without esophagitis: Secondary | ICD-10-CM | POA: Diagnosis present

## 2019-04-19 DIAGNOSIS — Z881 Allergy status to other antibiotic agents status: Secondary | ICD-10-CM | POA: Diagnosis not present

## 2019-04-19 DIAGNOSIS — K760 Fatty (change of) liver, not elsewhere classified: Secondary | ICD-10-CM | POA: Diagnosis present

## 2019-04-19 DIAGNOSIS — R339 Retention of urine, unspecified: Secondary | ICD-10-CM | POA: Diagnosis present

## 2019-04-19 DIAGNOSIS — G9529 Other cord compression: Secondary | ICD-10-CM | POA: Diagnosis present

## 2019-04-19 DIAGNOSIS — G992 Myelopathy in diseases classified elsewhere: Secondary | ICD-10-CM | POA: Diagnosis present

## 2019-04-19 DIAGNOSIS — I63511 Cerebral infarction due to unspecified occlusion or stenosis of right middle cerebral artery: Secondary | ICD-10-CM | POA: Diagnosis present

## 2019-04-19 DIAGNOSIS — R0602 Shortness of breath: Secondary | ICD-10-CM

## 2019-04-19 DIAGNOSIS — C761 Malignant neoplasm of thorax: Secondary | ICD-10-CM | POA: Diagnosis present

## 2019-04-19 DIAGNOSIS — E43 Unspecified severe protein-calorie malnutrition: Secondary | ICD-10-CM | POA: Diagnosis present

## 2019-04-19 DIAGNOSIS — F17210 Nicotine dependence, cigarettes, uncomplicated: Secondary | ICD-10-CM | POA: Diagnosis present

## 2019-04-19 DIAGNOSIS — I6340 Cerebral infarction due to embolism of unspecified cerebral artery: Secondary | ICD-10-CM | POA: Insufficient documentation

## 2019-04-19 DIAGNOSIS — C7951 Secondary malignant neoplasm of bone: Secondary | ICD-10-CM | POA: Diagnosis present

## 2019-04-19 DIAGNOSIS — M4854XA Collapsed vertebra, not elsewhere classified, thoracic region, initial encounter for fracture: Secondary | ICD-10-CM | POA: Diagnosis present

## 2019-04-19 DIAGNOSIS — R0902 Hypoxemia: Secondary | ICD-10-CM | POA: Diagnosis present

## 2019-04-19 DIAGNOSIS — G8220 Paraplegia, unspecified: Secondary | ICD-10-CM | POA: Diagnosis present

## 2019-04-19 DIAGNOSIS — R599 Enlarged lymph nodes, unspecified: Secondary | ICD-10-CM | POA: Diagnosis present

## 2019-04-19 DIAGNOSIS — Z880 Allergy status to penicillin: Secondary | ICD-10-CM

## 2019-04-19 DIAGNOSIS — Q211 Atrial septal defect: Secondary | ICD-10-CM | POA: Diagnosis not present

## 2019-04-19 DIAGNOSIS — C801 Malignant (primary) neoplasm, unspecified: Secondary | ICD-10-CM | POA: Diagnosis present

## 2019-04-19 DIAGNOSIS — C3411 Malignant neoplasm of upper lobe, right bronchus or lung: Secondary | ICD-10-CM

## 2019-04-19 DIAGNOSIS — Z716 Tobacco abuse counseling: Secondary | ICD-10-CM

## 2019-04-19 DIAGNOSIS — J40 Bronchitis, not specified as acute or chronic: Secondary | ICD-10-CM | POA: Diagnosis present

## 2019-04-19 DIAGNOSIS — R531 Weakness: Secondary | ICD-10-CM

## 2019-04-19 DIAGNOSIS — I63411 Cerebral infarction due to embolism of right middle cerebral artery: Secondary | ICD-10-CM

## 2019-04-19 DIAGNOSIS — Z801 Family history of malignant neoplasm of trachea, bronchus and lung: Secondary | ICD-10-CM | POA: Diagnosis not present

## 2019-04-19 DIAGNOSIS — K909 Intestinal malabsorption, unspecified: Secondary | ICD-10-CM

## 2019-04-19 DIAGNOSIS — J449 Chronic obstructive pulmonary disease, unspecified: Secondary | ICD-10-CM

## 2019-04-19 DIAGNOSIS — Z8249 Family history of ischemic heart disease and other diseases of the circulatory system: Secondary | ICD-10-CM | POA: Diagnosis not present

## 2019-04-19 DIAGNOSIS — R64 Cachexia: Secondary | ICD-10-CM | POA: Diagnosis present

## 2019-04-19 DIAGNOSIS — D72829 Elevated white blood cell count, unspecified: Secondary | ICD-10-CM

## 2019-04-19 DIAGNOSIS — Z720 Tobacco use: Secondary | ICD-10-CM

## 2019-04-19 DIAGNOSIS — R0600 Dyspnea, unspecified: Secondary | ICD-10-CM

## 2019-04-19 DIAGNOSIS — R197 Diarrhea, unspecified: Secondary | ICD-10-CM

## 2019-04-19 DIAGNOSIS — R918 Other nonspecific abnormal finding of lung field: Secondary | ICD-10-CM

## 2019-04-19 DIAGNOSIS — Z4659 Encounter for fitting and adjustment of other gastrointestinal appliance and device: Secondary | ICD-10-CM

## 2019-04-19 DIAGNOSIS — I959 Hypotension, unspecified: Secondary | ICD-10-CM

## 2019-04-19 DIAGNOSIS — J9601 Acute respiratory failure with hypoxia: Secondary | ICD-10-CM

## 2019-04-19 DIAGNOSIS — Z79899 Other long term (current) drug therapy: Secondary | ICD-10-CM

## 2019-04-19 DIAGNOSIS — Z9181 History of falling: Secondary | ICD-10-CM

## 2019-04-19 HISTORY — DX: Chronic obstructive pulmonary disease, unspecified: J44.9

## 2019-04-19 LAB — CBC WITH DIFFERENTIAL/PLATELET
Abs Immature Granulocytes: 0.08 10*3/uL — ABNORMAL HIGH (ref 0.00–0.07)
Basophils Absolute: 0.1 10*3/uL (ref 0.0–0.1)
Basophils Relative: 1 %
Eosinophils Absolute: 0 10*3/uL (ref 0.0–0.5)
Eosinophils Relative: 0 %
HCT: 37.3 % — ABNORMAL LOW (ref 39.0–52.0)
Hemoglobin: 11.3 g/dL — ABNORMAL LOW (ref 13.0–17.0)
Immature Granulocytes: 1 %
Lymphocytes Relative: 13 %
Lymphs Abs: 1.5 10*3/uL (ref 0.7–4.0)
MCH: 27.7 pg (ref 26.0–34.0)
MCHC: 30.3 g/dL (ref 30.0–36.0)
MCV: 91.4 fL (ref 80.0–100.0)
Monocytes Absolute: 0.4 10*3/uL (ref 0.1–1.0)
Monocytes Relative: 3 %
Neutro Abs: 9.8 10*3/uL — ABNORMAL HIGH (ref 1.7–7.7)
Neutrophils Relative %: 82 %
Platelets: 409 10*3/uL — ABNORMAL HIGH (ref 150–400)
RBC: 4.08 MIL/uL — ABNORMAL LOW (ref 4.22–5.81)
RDW: 14.3 % (ref 11.5–15.5)
WBC: 11.8 10*3/uL — ABNORMAL HIGH (ref 4.0–10.5)
nRBC: 0 % (ref 0.0–0.2)

## 2019-04-19 LAB — COMPREHENSIVE METABOLIC PANEL
ALT: 36 U/L (ref 0–44)
AST: 28 U/L (ref 15–41)
Albumin: 2.5 g/dL — ABNORMAL LOW (ref 3.5–5.0)
Alkaline Phosphatase: 137 U/L — ABNORMAL HIGH (ref 38–126)
Anion gap: 15 (ref 5–15)
BUN: 11 mg/dL (ref 6–20)
CO2: 27 mmol/L (ref 22–32)
Calcium: 8.9 mg/dL (ref 8.9–10.3)
Chloride: 99 mmol/L (ref 98–111)
Creatinine, Ser: 0.66 mg/dL (ref 0.61–1.24)
GFR calc Af Amer: >60 mL/min (ref >60)
GFR calc non Af Amer: >60 mL/min (ref >60)
Glucose, Bld: 122 mg/dL — ABNORMAL HIGH (ref 70–99)
Potassium: 4.2 mmol/L (ref 3.5–5.1)
Sodium: 141 mmol/L (ref 135–145)
Total Bilirubin: 0.6 mg/dL (ref 0.3–1.2)
Total Protein: 7.2 g/dL (ref 6.5–8.1)

## 2019-04-19 LAB — SARS CORONAVIRUS 2 BY RT PCR (HOSPITAL ORDER, PERFORMED IN ~~LOC~~ HOSPITAL LAB): SARS Coronavirus 2: NEGATIVE

## 2019-04-19 LAB — D-DIMER, QUANTITATIVE (NOT AT ARMC): D-Dimer, Quant: 1.09 ug/mL-FEU — ABNORMAL HIGH (ref 0.00–0.50)

## 2019-04-19 LAB — BRAIN NATRIURETIC PEPTIDE: B Natriuretic Peptide: 34 pg/mL (ref 0.0–100.0)

## 2019-04-19 MED ORDER — SODIUM CHLORIDE 0.9 % IV SOLN
INTRAVENOUS | Status: AC
  Administered 2019-04-19: 21:00:00 via INTRAVENOUS

## 2019-04-19 MED ORDER — SODIUM CHLORIDE 0.9% FLUSH
3.0000 mL | Freq: Two times a day (BID) | INTRAVENOUS | Status: DC
  Administered 2019-04-20: 3 mL via INTRAVENOUS
  Administered 2019-04-21: 10 mL via INTRAVENOUS
  Administered 2019-04-22 – 2019-04-26 (×6): 3 mL via INTRAVENOUS

## 2019-04-19 MED ORDER — IPRATROPIUM-ALBUTEROL 0.5-2.5 (3) MG/3ML IN SOLN: 3.0000 mL | Freq: Four times a day (QID) | RESPIRATORY_TRACT | Status: DC

## 2019-04-19 MED ORDER — HEPARIN SODIUM (PORCINE) 5000 UNIT/ML IJ SOLN: 5000.0000 [IU] | Freq: Three times a day (TID) | INTRAMUSCULAR | Status: DC

## 2019-04-19 MED ORDER — DOXYCYCLINE HYCLATE 100 MG PO TABS
100.0000 mg | ORAL_TABLET | Freq: Two times a day (BID) | ORAL | Status: DC
  Administered 2019-04-19: 100 mg via ORAL
  Filled 2019-04-19 (×2): qty 1

## 2019-04-19 MED ORDER — ALBUTEROL SULFATE HFA 108 (90 BASE) MCG/ACT IN AERS
4.0000 | INHALATION_SPRAY | RESPIRATORY_TRACT | Status: DC | PRN
  Filled 2019-04-19: qty 6.7

## 2019-04-19 MED ORDER — FENTANYL CITRATE (PF) 100 MCG/2ML IJ SOLN
50.0000 ug | INTRAMUSCULAR | Status: DC | PRN
  Administered 2019-04-19 – 2019-04-20 (×2): 50 ug via INTRAVENOUS
  Filled 2019-04-19 (×2): qty 2

## 2019-04-19 MED ORDER — MORPHINE SULFATE (PF) 4 MG/ML IV SOLN
3.0000 mg | INTRAVENOUS | Status: DC | PRN
  Administered 2019-04-19 – 2019-04-20 (×3): 3 mg via INTRAVENOUS
  Filled 2019-04-19 (×3): qty 1

## 2019-04-19 MED ORDER — IPRATROPIUM-ALBUTEROL 0.5-2.5 (3) MG/3ML IN SOLN
3.0000 mL | Freq: Four times a day (QID) | RESPIRATORY_TRACT | Status: DC
  Administered 2019-04-19 – 2019-04-20 (×3): 3 mL via RESPIRATORY_TRACT
  Filled 2019-04-19 (×4): qty 3

## 2019-04-19 MED ORDER — METHYLPREDNISOLONE SODIUM SUCC 125 MG IJ SOLR
60.0000 mg | Freq: Two times a day (BID) | INTRAMUSCULAR | Status: DC
  Administered 2019-04-19 – 2019-04-20 (×2): 60 mg via INTRAVENOUS
  Filled 2019-04-19 (×2): qty 2

## 2019-04-19 MED ORDER — DM-GUAIFENESIN ER 30-600 MG PO TB12
1.0000 | ORAL_TABLET | Freq: Two times a day (BID) | ORAL | Status: DC
  Administered 2019-04-19: 1 via ORAL
  Filled 2019-04-19 (×2): qty 1

## 2019-04-19 MED ORDER — IOHEXOL 350 MG/ML SOLN
100.0000 mL | Freq: Once | INTRAVENOUS | Status: AC | PRN
  Administered 2019-04-19: 100 mL via INTRAVENOUS

## 2019-04-19 MED ORDER — SODIUM CHLORIDE 0.9% FLUSH: 3.0000 mL | INTRAVENOUS | Status: DC | PRN

## 2019-04-19 MED ORDER — ALBUTEROL SULFATE (2.5 MG/3ML) 0.083% IN NEBU
2.5000 mg | INHALATION_SOLUTION | RESPIRATORY_TRACT | Status: DC | PRN
Start: 2019-04-19 — End: 2019-04-26

## 2019-04-19 MED ORDER — BUDESONIDE 0.5 MG/2ML IN SUSP
0.5000 mg | Freq: Two times a day (BID) | RESPIRATORY_TRACT | Status: DC
Start: 2019-04-19 — End: 2019-04-26
  Administered 2019-04-19 – 2019-04-25 (×13): 0.5 mg via RESPIRATORY_TRACT
  Filled 2019-04-19 (×14): qty 2

## 2019-04-19 MED ORDER — OXYCODONE HCL 5 MG PO TABS
5.0000 mg | ORAL_TABLET | Freq: Four times a day (QID) | ORAL | Status: DC | PRN
  Administered 2019-04-19: 5 mg via ORAL
  Filled 2019-04-19: qty 1

## 2019-04-19 MED ORDER — HEPARIN SODIUM (PORCINE) 5000 UNIT/ML IJ SOLN
5000.0000 [IU] | Freq: Three times a day (TID) | INTRAMUSCULAR | Status: DC
  Administered 2019-04-19: 5000 [IU] via SUBCUTANEOUS
  Filled 2019-04-19: qty 1

## 2019-04-19 MED ORDER — PANTOPRAZOLE SODIUM 40 MG PO TBEC
40.0000 mg | DELAYED_RELEASE_TABLET | Freq: Every day | ORAL | Status: DC
  Filled 2019-04-19: qty 1

## 2019-04-19 MED ORDER — SODIUM CHLORIDE 0.9 % IV SOLN: 250.0000 mL | INTRAVENOUS | Status: DC | PRN

## 2019-04-19 MED ORDER — METHYLPREDNISOLONE SODIUM SUCC 125 MG IJ SOLR: 125.0000 mg | Freq: Once | INTRAMUSCULAR | Status: DC

## 2019-04-19 MED ORDER — NICOTINE 14 MG/24HR TD PT24
14.0000 mg | MEDICATED_PATCH | Freq: Every day | TRANSDERMAL | Status: DC
  Administered 2019-04-19 – 2019-04-26 (×8): 14 mg via TRANSDERMAL
  Filled 2019-04-19 (×8): qty 1

## 2019-04-19 MED ORDER — ALBUTEROL SULFATE HFA 108 (90 BASE) MCG/ACT IN AERS
4.0000 | INHALATION_SPRAY | Freq: Once | RESPIRATORY_TRACT | Status: AC
  Administered 2019-04-19: 4 via RESPIRATORY_TRACT
  Filled 2019-04-19: qty 6.7

## 2019-04-19 MED ORDER — SODIUM CHLORIDE 0.9 % IV BOLUS
500.0000 mL | Freq: Once | INTRAVENOUS | Status: AC
  Administered 2019-04-19: 500 mL via INTRAVENOUS

## 2019-04-19 NOTE — H&P (Signed)
History and Physical    DARROLD BEZEK YHC:[id number] DOB: 08/15/1961 DOA: 04/19/2019  Referring MD/NP/PA: Dr. Reather Converse. PCP: Patient, No Pcp Per  Patient coming from: Home  Chief Complaint: Shortness of breath, cough, weight loss, pleuritic chest pain.  HPI: Paul Thornton is a 58 y.o. male with a past medical history significant for tobacco abuse, COPD and gastroesophageal reflux disease; presented to the emergency department secondary to increased shortness of breath, cough and weight loss.  Patient reports weight loss despite adequate oral intake in the last 3-43-month.  Reported shortness of breath, increased wheezing and coughing spells for the last 4 to 5 days, worsening.  No hemoptysis.  Patient reports right-sided chest discomfort, worse with movement and coughing, radiated to his back, 8 out of 10 in intensity, no alleviating factors.  Reports pain has been getting worse in the last 48 hours prior to admission.  Patient denies fever, chills, nausea, vomiting, dysuria, melena, hematochezia, sick contacts or any other complaints.  In the ED was found to be tachypneic, borderline hypoxic, with positive expiratory wheezing.  Oxygen supplementation and albuterol treatment provided with some improvement in his symptoms.  D-dimer was elevated, mild elevation of WBCs and bronchitic changes appreciated on chest x-ray.  There was positive findings of a right lung mass further stratified with a CT chest that demonstrated what appears to be metastatic lung cancer involving pleura, ribs and T4-T5 vertebral bodies.  TRH has been consulted to admit patient for further evaluation and management.  Past Medical/Surgical History: Past Medical History:  Diagnosis Date   Bronchitis    COPD (chronic obstructive pulmonary disease) ([place])     Social History:  reports that he has been smoking. He has been smoking about 0.25 packs per day. He has never used smokeless tobacco. He reports that he does not drink  alcohol or use drugs.  Allergies: Allergies  Allergen Reactions   Amoxicillin Hives    All "cillins"   Penicillins Hives    Did it involve swelling of the face/tongue/throat, SOB, or low BP? No Did it involve sudden or severe rash/hives, skin peeling, or any reaction on the inside of your mouth or nose? No Did you need to seek medical attention at a hospital or doctor's office? No When did it last happen? If all above answers are NO, may proceed with cephalosporin use.    Family History:  Positive for lung cancer in his dad. Also reports some hypertension in his mother's side.  Prior to Admission medications   Medication Sig Start Date End Date Taking? Authorizing Provider  albuterol (PROVENTIL HFA;VENTOLIN HFA) 108 (90 BASE) MCG/ACT inhaler Inhale 1-2 puffs into the lungs every 6 (six) hours as needed for wheezing or shortness of breath. Patient not taking: Reported on 04/19/2019 01/28/14   Ripley Fraise, MD  predniSONE (DELTASONE) 50 MG tablet One tablet PO daily for 4 days Patient not taking: Reported on 04/19/2019 01/28/14   Ripley Fraise, MD    Review of Systems:  Negative except as otherwise mentioned in HPI.   Physical Exam: Vitals:   04/19/19 1700 04/19/19 1706 04/19/19 1730 04/19/19 1850  BP: 97/69 108/78 97/71 99/69   Pulse: 83 81 76 80  Resp: 12 13 13 16   Temp:      TempSrc:      SpO2: 100% 100% 100% 99%  Weight:      Height:       Constitutional: NAD, having difficulty speaking in full sentences and demonstrating tachypnea.  Avoiding 1 L  nasal cannula oxygen supplementation.  No nausea or vomiting.  Reports having chest pain pleuritic, right costal area and back. Eyes: PERRL, lids and conjunctivae normal, no icterus, no nystagmus. ENMT: Mucous membranes are moist. Posterior pharynx clear of any exudate or lesions.  Neck: normal, supple, no masses, no thyromegaly, no JVD. Respiratory: No using accessory muscles.  Positive tachypnea; diffuse rhonchi  right and positive expiratory wheezing on exam.   Cardiovascular: Regular rate and rhythm, no murmurs / rubs / gallops. No extremity edema. 2+ pedal pulses. No carotid bruits.  Abdomen: no tenderness, no masses palpated. No hepatosplenomegaly. Bowel sounds positive.  Musculoskeletal: no clubbing / cyanosis. No joint deformity upper and lower extremities. Good ROM, no contractures. Normal muscle tone.  Skin: no rashes, lesions, ulcers. No induration Neurologic: CN 2-12 grossly intact. Sensation intact, DTR normal. Strength 5/5 in all 4.  Psychiatric: Normal judgment and insight. Alert and oriented x 3. Normal mood.    Labs on Admission: I have personally reviewed the following labs and imaging studies  CBC: Recent Labs  Lab 04/19/19 1114  WBC 11.8*  NEUTROABS 9.8*  HGB 11.3*  HCT 37.3*  MCV 91.4  PLT 371*   Basic Metabolic Panel: Recent Labs  Lab 04/19/19 1114  NA 141  K 4.2  CL 99  CO2 27  GLUCOSE 122*  BUN 11  CREATININE 0.66  CALCIUM 8.9   GFR: Estimated Creatinine Clearance: 59.5 mL/min (by C-G formula based on SCr of 0.66 mg/dL).   Liver Function Tests: Recent Labs  Lab 04/19/19 1114  AST 28  ALT 36  ALKPHOS 137*  BILITOT 0.6  PROT 7.2  ALBUMIN 2.5*    Recent Results (from the past 240 hour(s))  SARS Coronavirus 2 (CEPHEID- Performed in [place] hospital lab), Hosp Order     Status: None   Collection Time: 04/19/19 11:42 AM   Specimen: Nasopharyngeal Swab  Result Value Ref Range Status   SARS Coronavirus 2 NEGATIVE NEGATIVE Final    Comment: (NOTE) If result is NEGATIVE SARS-CoV-2 target nucleic acids are NOT DETECTED. The SARS-CoV-2 RNA is generally detectable in upper and lower  respiratory specimens during the acute phase of infection. The lowest  concentration of SARS-CoV-2 viral copies this assay can detect is 250  copies / mL. A negative result does not preclude SARS-CoV-2 infection  and should not be used as the sole basis for treatment or  other  patient management decisions.  A negative result may occur with  improper specimen collection / handling, submission of specimen other  than nasopharyngeal swab, presence of viral mutation(s) within the  areas targeted by this assay, and inadequate number of viral copies  (<250 copies / mL). A negative result must be combined with clinical  observations, patient history, and epidemiological information. If result is POSITIVE SARS-CoV-2 target nucleic acids are DETECTED. The SARS-CoV-2 RNA is generally detectable in upper and lower  respiratory specimens dur ing the acute phase of infection.  Positive  results are indicative of active infection with SARS-CoV-2.  Clinical  correlation with patient history and other diagnostic information is  necessary to determine patient infection status.  Positive results do  not rule out bacterial infection or co-infection with other viruses. If result is PRESUMPTIVE POSTIVE SARS-CoV-2 nucleic acids MAY BE PRESENT.   A presumptive positive result was obtained on the submitted specimen  and confirmed on repeat testing.  While 2019 novel coronavirus  (SARS-CoV-2) nucleic acids may be present in the submitted sample  additional confirmatory  testing may be necessary for epidemiological  and / or clinical management purposes  to differentiate between  SARS-CoV-2 and other Sarbecovirus currently known to infect humans.  If clinically indicated additional testing with an alternate test  methodology [id number]) is advised. The SARS-CoV-2 RNA is generally  detectable in upper and lower respiratory sp ecimens during the acute  phase of infection. The expected result is Negative. Fact Sheet for Patients:  StrictlyIdeas.no Fact Sheet for Healthcare Providers: BankingDealers.co.za This test is not yet approved or cleared by the [place] FDA and has been authorized for detection and/or diagnosis of  SARS-CoV-2 by FDA under an Emergency Use Authorization (EUA).  This EUA will remain in effect (meaning this test can be used) for the duration of the COVID-19 declaration under Section 564(b)(1) of the Act, 21 U.S.C. section 360bbb-3(b)(1), unless the authorization is terminated or revoked sooner. Performed at [facility], [address], [place], [place] [address]      Radiological Exams on Admission: Ct Angio Chest Pe W And/or Wo Contrast  Result Date: 04/19/2019 CLINICAL DATA:  Chest pain EXAM: CT ANGIOGRAPHY CHEST WITH CONTRAST TECHNIQUE: Multidetector CT imaging of the chest was performed using the standard protocol during bolus administration of intravenous contrast. Multiplanar CT image reconstructions and MIPs were obtained to evaluate the vascular anatomy. CONTRAST:  151mL OMNIPAQUE IOHEXOL 350 MG/ML SOLN COMPARISON:  April 19, 2019 chest radiograph FINDINGS: Cardiovascular: There is no demonstrable pulmonary embolus. There is no thoracic aortic aneurysm or dissection. The visualized great vessels appear unremarkable. There is no pericardial effusion or pericardial thickening. Mediastinum/Nodes: Thyroid appears unremarkable. There is an aortopulmonary lymph node measuring 1.7 x 1.5 cm. There are subcentimeter right hilar lymph nodes. No esophageal lesion evident. Lungs/Pleura: There is underlying centrilobular and paraseptal emphysematous change. There are bullae in the upper lobes as well. There is a mass arising in the posterior segment of the right lower lobe which invades the right paratracheal region of the mediastinum as well as the posterior right pleura in the right upper hemithorax. There is extensive bony destruction involving portions of the right fourth and fifth ribs as well as bony destruction and the T4 and T5 vertebral bodies. There is marked collapse of the T4 vertebral body with extensive tumor throughout this area due to the adjacent large mass. This mass measures 6.9 cm from  right to left dimension, 7.2 cm from superior to inferior dimension, and 6.2 cm from anterior to posterior dimension. There is patchy atelectasis and probable mild postobstructive pneumonitis in the posterior segment right upper lobe. No similar mass lesions are noted elsewhere. No evident pleural effusion. Upper Abdomen: There is hepatic steatosis. Visualized upper abdominal structures otherwise appear unremarkable. Musculoskeletal: Bony destruction involving portions of the T5 and T4 vertebral bodies with marked collapse of the T4 vertebral body. There is destruction of portions of the posterior right fourth and fifth ribs due to the large tumor in this area. There is destruction of portions of the posterior elements at T5 on the right. Other bony structures appear unremarkable. No chest wall lesions are evident. Review of the MIP images confirms the above findings. IMPRESSION: 1. No demonstrable pulmonary embolus. No thoracic aortic aneurysm or dissection. 2. 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 Marked collapse of the  T4 vertebral body noted. Invasion of the posterior elements at T5 on the right noted. This mass measures 7.2 x 6.2 x 6.9 cm. 3.  Extensive underlying emphysematous change. 4.  Aortopulmonary window region adenopathy. 5.  Hepatic steatosis. Emphysema (ICD10-J43.9). Electronically Signed   By: Lowella Grip III M.D.   On: 04/19/2019 14:56   Dg Chest Portable 1 View  Result Date: 04/19/2019 CLINICAL DATA:  Shortness of breath, generalized weakness for 2 days, COPD, RIGHT-side pain, cough, smoker EXAM: PORTABLE CHEST 1 VIEW COMPARISON:  01/28/2014 FINDINGS: Normal heart size and pulmonary vascularity. Medial RIGHT upper lobe mass 6.8 x 4.8 cm highly  worrisome for pulmonary neoplasm. Underlying emphysematous changes consistent with history of COPD. RIGHT apical scarring. No infiltrate, pleural effusion, or pneumothorax. Bones demineralized. IMPRESSION: COPD changes with RIGHT upper lobe scarring. Large medial RIGHT upper lobe mass measuring 6.8 x 4.8 cm highly worrisome for a primary pulmonary neoplasm; further evaluation by CT chest recommended, with contrast if patient's renal function permits. Findings called to Dr. Reather Converse on 04/19/2019 at 1200 hours. Electronically Signed   By: Lavonia Dana M.D.   On: 04/19/2019 12:02    EKG: Independently reviewed.  Normal sinus rhythm, normal axis and no acute ischemic changes.  Assessment/Plan 1-COPD exacerbation ([place]): With borderline hypoxia -Patient reports increased shortness of breath, wheezing and difficulty speaking in full sentences. -1 L nasal cannula oxygen supplementation in ED to stabilize oxygen saturation -Tachypneic with a respiratory rate in the 26 breaths/min on presentation. -Patient admitted for IV steroids, doxycycline, Pulmicort, duo nebs and flutter valve. -Wean oxygen supplementation as tolerated and follow clinical response.  2-Tobacco abuse -I have discussed tobacco cessation with the patient.  I have counseled the patient regarding the negative impacts of continued tobacco use including but not limited to lung cancer, COPD, and cardiovascular disease.  I have discussed alternatives to tobacco and modalities that may help facilitate tobacco cessation including but not limited to biofeedback, hypnosis, and medications.  Total time spent with tobacco counseling was 4 minutes. -Nicotine patch has been ordered.  3-Mass of right lung -With findings suggesting metastatic lung cancer -IR has been consulted for tissue biopsy -Oncology made aware of admission to establish plan and further staging. -will follow recommendations -patient with pleuritic pain, will use morphine and  oxycodone as needed.  4-GI prophylaxis and GERD (gastroesophageal reflux disease) -Started on Protonix  5-bronchiectasis/bronchitis -As mentioned above will treat with doxycycline -Patient currently afebrile.  6-elevated d-dimer -In the setting of most likely lung cancer -No signs of pulmonary embolism.  DVT prophylaxis: Heparin Code Status: Full code Family Communication: No family at bedside. Disposition Plan: Anticipate discharge back home once medically stable, breathing is stabilized and work-up/treatment for right lung mass establish. Consults called: Interventional radiologist (Dr. Earleen Newport) and oncology service (Dr. Delton Coombes). Admission status: Inpatient, MedSurg, length of stay more than 2 midnights.   Time Spent: 65 minutes  Barton Dubois MD [facility] Pager [phone]  04/19/2019, 7:12 PM

## 2019-04-19 NOTE — Progress Notes (Signed)
RN paged provider to make her aware patient unable to urinate, has >745 ml urine per bladder scan, requested to I&O cath patient, awaiting response.  P.J. Linus Mako, RN

## 2019-04-19 NOTE — Progress Notes (Signed)
Patient wanted to try to stand to urinate, was able to urinate a total of 350 ml.  Patient states he feels better and wants to forgo the I&O catheter for now.  RN will continue to monitor patient.  P.J. Linus Mako, RN

## 2019-04-19 NOTE — Progress Notes (Signed)
Pt scheduled for a lung biopsy at [facility] in IR department at 1300 on 6/25 performed by  Dr. Anselm Pancoast. [facility] arranged and will arrive at [facility] at 1130 on 04/20/19. Consent will be obtained at [facility].

## 2019-04-19 NOTE — ED Provider Notes (Signed)
[facility] EMERGENCY DEPARTMENT Provider Note   CSN: [id number] Arrival date & time: 04/19/19  3149    History   Chief Complaint Chief Complaint  Patient presents with  . Shortness of Breath    HPI Paul CAMERER is a 58 y.o. male.     Patient with history of chronic tobacco abuse, COPD not on home oxygen presents with worsening shortness of breath and right rib pain worsening for the past 3 days.  Patient said general weakness and weight loss as well.  Patient was given DuoNeb and Solu-Medrol on route.  Patient denies fever and has had minimal cough.  No sick contacts or recent travel.  Patient denies blood clot risk factors.  No cardiac history.  No anterior left-sided chest pain or pressure.     Past Medical History:  Diagnosis Date  . Bronchitis   . COPD (chronic obstructive pulmonary disease) ([facility])     There are no active problems to display for this patient.   History reviewed. No pertinent surgical history.      Home Medications    Prior to Admission medications   Medication Sig Start Date End Date Taking? Authorizing Provider  albuterol (PROVENTIL HFA;VENTOLIN HFA) 108 (90 BASE) MCG/ACT inhaler Inhale 1-2 puffs into the lungs every 6 (six) hours as needed for wheezing or shortness of breath. Patient not taking: Reported on 04/19/2019 01/28/14   Ripley Fraise, MD  predniSONE (DELTASONE) 50 MG tablet One tablet PO daily for 4 days Patient not taking: Reported on 04/19/2019 01/28/14   Ripley Fraise, MD    Family History History reviewed. No pertinent family history.  Social History Social History   Tobacco Use  . Smoking status: Current Every Day Smoker    Packs/day: 0.25  . Smokeless tobacco: Never Used  Substance Use Topics  . Alcohol use: No  . Drug use: No     Allergies   Amoxicillin and Penicillins   Review of Systems Review of Systems  Constitutional: Positive for fatigue and unexpected weight change. Negative for chills and fever.   HENT: Negative for congestion.   Eyes: Negative for visual disturbance.  Respiratory: Positive for cough and shortness of breath.   Cardiovascular: Negative for chest pain.  Gastrointestinal: Negative for abdominal pain and vomiting.  Genitourinary: Negative for dysuria and flank pain.  Musculoskeletal: Negative for back pain, neck pain and neck stiffness.  Skin: Negative for rash.  Neurological: Negative for light-headedness and headaches.     Physical Exam Updated Vital Signs BP 103/71   Pulse 85   Temp 98.4 F (36.9 C) (Oral)   Resp 14   Ht 5\' 9"  (1.753 m)   Wt 40.8 kg   SpO2 100%   BMI 13.29 kg/m   Physical Exam Vitals signs and nursing note reviewed.  Constitutional:      General: He is not in acute distress. HENT:     Head: Normocephalic and atraumatic.  Eyes:     General:        Right eye: No discharge.        Left eye: No discharge.     Conjunctiva/sclera: Conjunctivae normal.  Neck:     Musculoskeletal: Normal range of motion and neck supple.     Trachea: No tracheal deviation.  Cardiovascular:     Rate and Rhythm: Normal rate and regular rhythm.  Pulmonary:     Effort: Pulmonary effort is normal.     Breath sounds: Examination of the right-upper field reveals wheezing. Examination of  the left-upper field reveals wheezing. Examination of the right-middle field reveals wheezing. Examination of the left-middle field reveals wheezing. Examination of the right-lower field reveals wheezing. Examination of the left-lower field reveals wheezing. Wheezing present.  Abdominal:     General: There is no distension.     Palpations: Abdomen is soft.     Tenderness: There is no abdominal tenderness. There is no guarding.  Skin:    General: Skin is warm.     Findings: No rash.  Neurological:     Mental Status: He is alert and oriented to person, place, and time.      ED Treatments / Results  Labs (all labs ordered are listed, but only abnormal results are  displayed) Labs Reviewed  CBC WITH DIFFERENTIAL/PLATELET - Abnormal; Notable for the following components:      Result Value   WBC 11.8 (*)    RBC 4.08 (*)    Hemoglobin 11.3 (*)    HCT 37.3 (*)    Platelets 409 (*)    Neutro Abs 9.8 (*)    Abs Immature Granulocytes 0.08 (*)    All other components within normal limits  COMPREHENSIVE METABOLIC PANEL - Abnormal; Notable for the following components:   Glucose, Bld 122 (*)    Albumin 2.5 (*)    Alkaline Phosphatase 137 (*)    All other components within normal limits  D-DIMER, QUANTITATIVE (NOT AT [facility]) - Abnormal; Notable for the following components:   D-Dimer, Quant 1.09 (*)    All other components within normal limits  SARS CORONAVIRUS 2 (HOSPITAL ORDER, [place] [facility])  BRAIN NATRIURETIC PEPTIDE    EKG None  Radiology Dg Chest Portable 1 View  Result Date: 04/19/2019 CLINICAL DATA:  Shortness of breath, generalized weakness for 2 days, COPD, RIGHT-side pain, cough, smoker EXAM: PORTABLE CHEST 1 VIEW COMPARISON:  01/28/2014 FINDINGS: Normal heart size and pulmonary vascularity. Medial RIGHT upper lobe mass 6.8 x 4.8 cm highly worrisome for pulmonary neoplasm. Underlying emphysematous changes consistent with history of COPD. RIGHT apical scarring. No infiltrate, pleural effusion, or pneumothorax. Bones demineralized. IMPRESSION: COPD changes with RIGHT upper lobe scarring. Large medial RIGHT upper lobe mass measuring 6.8 x 4.8 cm highly worrisome for a primary pulmonary neoplasm; further evaluation by CT chest recommended, with contrast if patient's renal function permits. Findings called to Dr. Reather Converse on 04/19/2019 at 1200 hours. Electronically Signed   By: Lavonia Dana M.D.   On: 04/19/2019 12:02    Procedures Procedures (including critical care time)  Medications Ordered in ED Medications  fentaNYL (SUBLIMAZE) injection 50 mcg (50 mcg Intravenous Given 04/19/19 1138)  albuterol (VENTOLIN HFA) 108 (90  Base) MCG/ACT inhaler 4 puff (has no administration in time range)  albuterol (VENTOLIN HFA) 108 (90 Base) MCG/ACT inhaler 4 puff (4 puffs Inhalation Given 04/19/19 1052)  sodium chloride 0.9 % bolus 500 mL (0 mLs Intravenous Stopped 04/19/19 1230)  iohexol (OMNIPAQUE) 350 MG/ML injection 100 mL (100 mLs Intravenous Contrast Given 04/19/19 1417)     Initial Impression / Assessment and Plan / ED Course  I have reviewed the triage vital signs and the nursing notes.  Pertinent labs & imaging results that were available during my care of the patient were reviewed by me and considered in my medical decision making (see chart for details).       Patient presents with worsening right chest and rib pain and shortness of breath.  Patient heart rate in the 90s, normal oxygenation on arrival.  Patient placed on 1 L nasal cannula on arrival by nursing staff.  Pain meds ordered.  Discussed concern for differential including COPD exac, cancer, infection, pulmonary embolism.  Plan for blood work including d-dimer, chest x-ray reviewed concerning for right lung mass.  CT scan pending.  Albuterol given.  Repeat albuterol ordered.  Patient requiring 1 L nasal cannula.  Plan for admission/observation for lung cancer and COPD exacerbation.  COVID test pending.  Updated patient on concern for large cancer diagnosis in the right lung.  Patient requesting give me permission to talk to his sister for which I called and updated on the phone.  Hospitalist paged.  Cover test negative. Blood work reviewed mild leukocytosis 11.8, minimal anemia 11.3.  Spoke with hospitalist to recommend observation/admission for COPD exacerbation, possible bronc/biopsy and further evaluation.  Patient is been stable on 1 L nasal cannula.   Final Clinical Impressions(s) / ED Diagnoses   Final diagnoses:  Acute dyspnea  Mass of right lung  Acute exacerbation of chronic obstructive pulmonary disease (COPD) ([place])  Tobacco abuse    ED  Discharge Orders    None       Elnora Morrison, MD 04/19/19 1457

## 2019-04-19 NOTE — ED Notes (Signed)
Paul Thornton , [phone]

## 2019-04-19 NOTE — ED Triage Notes (Addendum)
Patient brought in by EMS for complaint of shortness of breath and generalized weakness x 2 days. States has history of COPD and has no PCP. Per EMS, patient was given duoneb and 125 solumedrol en route to ER.

## 2019-04-19 NOTE — Progress Notes (Signed)
Patient approved for CT guided lung biopsy tomorrow (6/25) in radiology at [facility]. Spoke with patient's floor RN today at 1719 to arrange transport via [place] - patient to arrive in radiology department by 1300 tomorrow for planned procedure at 1400 and planned to return to [facility] following procedure. Full consult and consent will be performed once patient arrives at [facility].  Please call IR at [phone] or on call IR MD with any questions or concerns overnight.   Candiss Norse, PA-C

## 2019-04-20 ENCOUNTER — Inpatient Hospital Stay (HOSPITAL_COMMUNITY): Payer: Medicaid Other | Admitting: Anesthesiology

## 2019-04-20 ENCOUNTER — Inpatient Hospital Stay (HOSPITAL_COMMUNITY): Admit: 2019-04-20 | Payer: Medicaid Other

## 2019-04-20 ENCOUNTER — Inpatient Hospital Stay (HOSPITAL_COMMUNITY): Payer: Medicaid Other

## 2019-04-20 ENCOUNTER — Ambulatory Visit (HOSPITAL_COMMUNITY)
Admit: 2019-04-20 | Discharge: 2019-04-20 | Disposition: A | Discharge status: Home / Self Care | Payer: Medicaid Other | Attending: Internal Medicine | Admitting: Internal Medicine

## 2019-04-20 ENCOUNTER — Encounter (HOSPITAL_COMMUNITY): Payer: Self-pay

## 2019-04-20 ENCOUNTER — Other Ambulatory Visit (HOSPITAL_COMMUNITY): Payer: Self-pay

## 2019-04-20 ENCOUNTER — Other Ambulatory Visit: Payer: Self-pay

## 2019-04-20 ENCOUNTER — Ambulatory Visit (HOSPITAL_COMMUNITY)
Admission: EM | Disposition: A | Discharge status: Rehabilitation Facility | Payer: Self-pay | Source: Home / Self Care | Attending: Neurological Surgery

## 2019-04-20 DIAGNOSIS — C801 Malignant (primary) neoplasm, unspecified: Secondary | ICD-10-CM | POA: Diagnosis present

## 2019-04-20 DIAGNOSIS — Z880 Allergy status to penicillin: Secondary | ICD-10-CM | POA: Insufficient documentation

## 2019-04-20 DIAGNOSIS — Z9889 Other specified postprocedural states: Secondary | ICD-10-CM

## 2019-04-20 DIAGNOSIS — R590 Localized enlarged lymph nodes: Secondary | ICD-10-CM | POA: Insufficient documentation

## 2019-04-20 DIAGNOSIS — F17210 Nicotine dependence, cigarettes, uncomplicated: Secondary | ICD-10-CM

## 2019-04-20 DIAGNOSIS — C3411 Malignant neoplasm of upper lobe, right bronchus or lung: Secondary | ICD-10-CM | POA: Insufficient documentation

## 2019-04-20 DIAGNOSIS — J439 Emphysema, unspecified: Secondary | ICD-10-CM | POA: Insufficient documentation

## 2019-04-20 DIAGNOSIS — C7951 Secondary malignant neoplasm of bone: Secondary | ICD-10-CM | POA: Diagnosis present

## 2019-04-20 DIAGNOSIS — R531 Weakness: Secondary | ICD-10-CM

## 2019-04-20 DIAGNOSIS — G9529 Other cord compression: Secondary | ICD-10-CM

## 2019-04-20 HISTORY — PX: POSTERIOR LUMBAR FUSION 4 LEVEL: SHX6037

## 2019-04-20 LAB — CBC
HCT: 33.2 % — ABNORMAL LOW (ref 39.0–52.0)
Hemoglobin: 10.3 g/dL — ABNORMAL LOW (ref 13.0–17.0)
MCH: 27.9 pg (ref 26.0–34.0)
MCHC: 31 g/dL (ref 30.0–36.0)
MCV: 90 fL (ref 80.0–100.0)
Platelets: 406 10*3/uL — ABNORMAL HIGH (ref 150–400)
RBC: 3.69 MIL/uL — ABNORMAL LOW (ref 4.22–5.81)
RDW: 14.3 % (ref 11.5–15.5)
WBC: 10.2 10*3/uL (ref 4.0–10.5)
nRBC: 0 % (ref 0.0–0.2)

## 2019-04-20 LAB — BASIC METABOLIC PANEL
Anion gap: 11 (ref 5–15)
BUN: 13 mg/dL (ref 6–20)
CO2: 23 mmol/L (ref 22–32)
Calcium: 8.9 mg/dL (ref 8.9–10.3)
Chloride: 103 mmol/L (ref 98–111)
Creatinine, Ser: 0.59 mg/dL — ABNORMAL LOW (ref 0.61–1.24)
GFR calc Af Amer: >60 mL/min (ref >60)
GFR calc non Af Amer: >60 mL/min (ref >60)
Glucose, Bld: 127 mg/dL — ABNORMAL HIGH (ref 70–99)
Potassium: 3.8 mmol/L (ref 3.5–5.1)
Sodium: 137 mmol/L (ref 135–145)

## 2019-04-20 LAB — PREPARE RBC (CROSSMATCH)

## 2019-04-20 LAB — HEMOGLOBIN A1C
Hgb A1c MFr Bld: 6 % — ABNORMAL HIGH (ref 4.8–5.6)
Mean Plasma Glucose: 125.5 mg/dL

## 2019-04-20 LAB — ABO/RH: ABO/RH(D): A POS

## 2019-04-20 LAB — MRSA PCR SCREENING: MRSA by PCR: NEGATIVE

## 2019-04-20 LAB — PROTIME-INR
INR: 1 (ref 0.8–1.2)
Prothrombin Time: 13.3 seconds (ref 11.4–15.2)

## 2019-04-20 LAB — TSH: TSH: 0.876 u[IU]/mL (ref 0.350–4.500)

## 2019-04-20 SURGERY — POSTERIOR LUMBAR FUSION 4 LEVEL
Anesthesia: General | Site: Back

## 2019-04-20 MED ORDER — BUPIVACAINE HCL (PF) 0.5 % IJ SOLN
INTRAMUSCULAR | Status: AC
  Filled 2019-04-20: qty 30

## 2019-04-20 MED ORDER — VANCOMYCIN HCL 1000 MG IV SOLR
INTRAVENOUS | Status: DC | PRN
  Administered 2019-04-20: 22:00:00 1000 mg via INTRAVENOUS

## 2019-04-20 MED ORDER — 0.9 % SODIUM CHLORIDE (POUR BTL) OPTIME
TOPICAL | Status: DC | PRN
  Administered 2019-04-20: 1000 mL

## 2019-04-20 MED ORDER — SODIUM CHLORIDE 0.9 % IV SOLN
INTRAVENOUS | Status: DC | PRN
  Administered 2019-04-20: 22:00:00 20 ug/min via INTRAVENOUS

## 2019-04-20 MED ORDER — THROMBIN 5000 UNITS EX SOLR
CUTANEOUS | Status: AC
  Filled 2019-04-20: qty 5000

## 2019-04-20 MED ORDER — ALPRAZOLAM 0.5 MG PO TABS
0.5000 mg | ORAL_TABLET | Freq: Once | ORAL | Status: AC
  Administered 2019-04-20: 0.5 mg via ORAL
  Filled 2019-04-20: qty 1

## 2019-04-20 MED ORDER — SUCCINYLCHOLINE CHLORIDE 20 MG/ML IJ SOLN
INTRAMUSCULAR | Status: DC | PRN
  Administered 2019-04-20: 100 mg via INTRAVENOUS

## 2019-04-20 MED ORDER — LIDOCAINE-EPINEPHRINE 1 %-1:100000 IJ SOLN
INTRAMUSCULAR | Status: AC
  Filled 2019-04-20: qty 1

## 2019-04-20 MED ORDER — MIDAZOLAM HCL 2 MG/2ML IJ SOLN
INTRAMUSCULAR | Status: AC
  Filled 2019-04-20: qty 2

## 2019-04-20 MED ORDER — FENTANYL CITRATE (PF) 100 MCG/2ML IJ SOLN
INTRAMUSCULAR | Status: AC | PRN
  Administered 2019-04-20: 50 ug via INTRAVENOUS

## 2019-04-20 MED ORDER — PROPOFOL 10 MG/ML IV BOLUS
INTRAVENOUS | Status: DC | PRN
  Administered 2019-04-20: 100 mg via INTRAVENOUS

## 2019-04-20 MED ORDER — FENTANYL CITRATE (PF) 100 MCG/2ML IJ SOLN
INTRAMUSCULAR | Status: AC
  Filled 2019-04-20: qty 2

## 2019-04-20 MED ORDER — GADOBUTROL 1 MMOL/ML IV SOLN
4.0000 mL | Freq: Once | INTRAVENOUS | Status: AC | PRN
  Administered 2019-04-20: 4 mL via INTRAVENOUS

## 2019-04-20 MED ORDER — ORAL CARE MOUTH RINSE
15.0000 mL | Freq: Two times a day (BID) | OROMUCOSAL | Status: DC
  Administered 2019-04-20 (×2): 15 mL via OROMUCOSAL

## 2019-04-20 MED ORDER — THROMBIN 5000 UNITS EX SOLR
CUTANEOUS | Status: AC
  Filled 2019-04-20: qty 15000

## 2019-04-20 MED ORDER — MIDAZOLAM HCL 2 MG/2ML IJ SOLN
INTRAMUSCULAR | Status: AC | PRN
  Administered 2019-04-20: 1 mg via INTRAVENOUS

## 2019-04-20 MED ORDER — THROMBIN 20000 UNITS EX SOLR
CUTANEOUS | Status: DC | PRN
  Administered 2019-04-20: 20 mL

## 2019-04-20 MED ORDER — SODIUM CHLORIDE 0.9% IV SOLUTION: Freq: Once | INTRAVENOUS | Status: DC

## 2019-04-20 MED ORDER — PROPOFOL 10 MG/ML IV BOLUS
INTRAVENOUS | Status: AC
  Filled 2019-04-20: qty 20

## 2019-04-20 MED ORDER — LACTATED RINGERS IV SOLN
INTRAVENOUS | Status: DC | PRN
  Administered 2019-04-20: 21:00:00 via INTRAVENOUS

## 2019-04-20 MED ORDER — FENTANYL CITRATE (PF) 250 MCG/5ML IJ SOLN
INTRAMUSCULAR | Status: DC | PRN
  Administered 2019-04-20: 100 ug via INTRAVENOUS
  Administered 2019-04-20 – 2019-04-21 (×3): 50 ug via INTRAVENOUS

## 2019-04-20 MED ORDER — ROCURONIUM BROMIDE 100 MG/10ML IV SOLN
INTRAVENOUS | Status: DC | PRN
  Administered 2019-04-20 – 2019-04-21 (×3): 50 mg via INTRAVENOUS

## 2019-04-20 MED ORDER — FENTANYL CITRATE (PF) 250 MCG/5ML IJ SOLN
INTRAMUSCULAR | Status: AC
  Filled 2019-04-20: qty 5

## 2019-04-20 MED ORDER — MIDAZOLAM HCL 5 MG/5ML IJ SOLN
INTRAMUSCULAR | Status: DC | PRN
  Administered 2019-04-20: 2 mg via INTRAVENOUS

## 2019-04-20 MED ORDER — DEXAMETHASONE SODIUM PHOSPHATE 10 MG/ML IJ SOLN
INTRAMUSCULAR | Status: DC | PRN
  Administered 2019-04-20: 10 mg via INTRAVENOUS

## 2019-04-20 MED ORDER — THROMBIN 20000 UNITS EX SOLR
CUTANEOUS | Status: AC
  Filled 2019-04-20: qty 20000

## 2019-04-20 MED ORDER — THROMBIN 5000 UNITS EX SOLR
OROMUCOSAL | Status: DC | PRN
  Administered 2019-04-20 – 2019-04-21 (×5): 5 mL

## 2019-04-20 MED ORDER — SODIUM CHLORIDE 0.9 % IV SOLN
INTRAVENOUS | Status: DC | PRN
  Administered 2019-04-20: 500 mL

## 2019-04-20 MED ORDER — HEPARIN SODIUM (PORCINE) 5000 UNIT/ML IJ SOLN
5000.0000 [IU] | Freq: Three times a day (TID) | INTRAMUSCULAR | Status: DC
  Administered 2019-04-21 – 2019-04-26 (×16): 5000 [IU] via SUBCUTANEOUS
  Filled 2019-04-20 (×16): qty 1

## 2019-04-20 MED ORDER — IPRATROPIUM-ALBUTEROL 0.5-2.5 (3) MG/3ML IN SOLN
3.0000 mL | Freq: Three times a day (TID) | RESPIRATORY_TRACT | Status: DC
  Administered 2019-04-21: 3 mL via RESPIRATORY_TRACT
  Filled 2019-04-20: qty 3

## 2019-04-20 MED ORDER — LIDOCAINE HCL (CARDIAC) PF 100 MG/5ML IV SOSY
PREFILLED_SYRINGE | INTRAVENOUS | Status: DC | PRN
  Administered 2019-04-20: 60 mg via INTRATRACHEAL

## 2019-04-20 MED ORDER — VANCOMYCIN HCL IN DEXTROSE 1-5 GM/200ML-% IV SOLN
INTRAVENOUS | Status: AC
  Filled 2019-04-20: qty 200

## 2019-04-20 MED ORDER — BUPIVACAINE HCL (PF) 0.5 % IJ SOLN
INTRAMUSCULAR | Status: DC | PRN
  Administered 2019-04-20: 10 mL

## 2019-04-20 MED ORDER — LACTATED RINGERS IV SOLN
INTRAVENOUS | Status: DC | PRN
  Administered 2019-04-20 – 2019-04-21 (×3): via INTRAVENOUS

## 2019-04-20 MED ORDER — LIDOCAINE-EPINEPHRINE 1 %-1:100000 IJ SOLN
INTRAMUSCULAR | Status: DC | PRN
  Administered 2019-04-20: 10 mL

## 2019-04-20 MED ORDER — LIDOCAINE HCL 1 % IJ SOLN
INTRAMUSCULAR | Status: AC
  Filled 2019-04-20: qty 20

## 2019-04-20 SURGICAL SUPPLY — 68 items
BAG DECANTER FOR FLEXI CONT (MISCELLANEOUS) ×3 IMPLANT
BASKET BONE COLLECTION (BASKET) ×3 IMPLANT
BLADE CLIPPER SURG (BLADE) IMPLANT
BUR MATCHSTICK NEURO 3.0 LAGG (BURR) ×3 IMPLANT
CANISTER SUCT 3000ML PPV (MISCELLANEOUS) ×3 IMPLANT
CONNECTOR RELINE O 30-35 5.5 (Connector) ×2 IMPLANT
CONT SPEC 4OZ CLIKSEAL STRL BL (MISCELLANEOUS) ×3 IMPLANT
COVER BACK TABLE 60X90IN (DRAPES) ×3 IMPLANT
COVER WAND RF STERILE (DRAPES) ×1 IMPLANT
DECANTER SPIKE VIAL GLASS SM (MISCELLANEOUS) ×3 IMPLANT
DERMABOND ADVANCED (GAUZE/BANDAGES/DRESSINGS) ×2
DERMABOND ADVANCED .7 DNX12 (GAUZE/BANDAGES/DRESSINGS) ×1 IMPLANT
DEVICE DISSECT PLASMABLAD 3.0S (MISCELLANEOUS) ×1 IMPLANT
DRAPE C-ARM 42X72 X-RAY (DRAPES) ×6 IMPLANT
DRAPE C-ARMOR (DRAPES) ×2 IMPLANT
DRAPE HALF SHEET 40X57 (DRAPES) IMPLANT
DRAPE LAPAROTOMY 100X72X124 (DRAPES) ×3 IMPLANT
DRSG OPSITE POSTOP 4X10 (GAUZE/BANDAGES/DRESSINGS) ×2 IMPLANT
DURAPREP 26ML APPLICATOR (WOUND CARE) ×3 IMPLANT
DURASEAL APPLICATOR TIP (TIP) IMPLANT
DURASEAL SPINE SEALANT 3ML (MISCELLANEOUS) IMPLANT
ELECT REM PT RETURN 9FT ADLT (ELECTROSURGICAL) ×3
ELECTRODE REM PT RTRN 9FT ADLT (ELECTROSURGICAL) ×1 IMPLANT
EVACUATOR 3/16  PVC DRAIN (DRAIN) ×2
EVACUATOR 3/16 PVC DRAIN (DRAIN) IMPLANT
GAUZE 4X4 16PLY RFD (DISPOSABLE) IMPLANT
GAUZE SPONGE 4X4 12PLY STRL (GAUZE/BANDAGES/DRESSINGS) ×3 IMPLANT
GLOVE BIOGEL PI IND STRL 8.5 (GLOVE) ×2 IMPLANT
GLOVE BIOGEL PI INDICATOR 8.5 (GLOVE) ×4
GLOVE ECLIPSE 8.5 STRL (GLOVE) ×6 IMPLANT
GOWN STRL REUS W/ TWL LRG LVL3 (GOWN DISPOSABLE) IMPLANT
GOWN STRL REUS W/ TWL XL LVL3 (GOWN DISPOSABLE) IMPLANT
GOWN STRL REUS W/TWL 2XL LVL3 (GOWN DISPOSABLE) ×6 IMPLANT
GOWN STRL REUS W/TWL LRG LVL3 (GOWN DISPOSABLE)
GOWN STRL REUS W/TWL XL LVL3 (GOWN DISPOSABLE)
HEMOSTAT POWDER KIT SURGIFOAM (HEMOSTASIS) IMPLANT
HEMOSTAT POWDER SURGIFOAM 1G (HEMOSTASIS) ×10 IMPLANT
KIT BASIN OR (CUSTOM PROCEDURE TRAY) ×3 IMPLANT
KIT TURNOVER KIT B (KITS) ×3 IMPLANT
MARKER SKIN DUAL TIP RULER LAB (MISCELLANEOUS) ×2 IMPLANT
MILL MEDIUM DISP (BLADE) ×3 IMPLANT
NDL I-PASS III (NEEDLE) IMPLANT
NEEDLE HYPO 22GX1.5 SAFETY (NEEDLE) ×3 IMPLANT
NEEDLE I-PASS III (NEEDLE) ×3 IMPLANT
NS IRRIG 1000ML POUR BTL (IV SOLUTION) ×3 IMPLANT
PACK LAMINECTOMY NEURO (CUSTOM PROCEDURE TRAY) ×3 IMPLANT
PAD ARMBOARD 7.5X6 YLW CONV (MISCELLANEOUS) ×9 IMPLANT
PATTIES SURGICAL .5 X1 (DISPOSABLE) ×3 IMPLANT
PLASMABLADE 3.0S (MISCELLANEOUS) ×3
ROD RELINE-O 5.5X300 STRT NS (Rod) IMPLANT
ROD RELINE-O 5.5X300MM STRT (Rod) ×4 IMPLANT
SCREW LOCK RELINE 5.5 TULIP (Screw) ×18 IMPLANT
SCREW RELINE 5.5X35 POLYAXIAL (Screw) ×8 IMPLANT
SCREW RELINE-O 4.5X45MM POLY (Screw) ×2 IMPLANT
SCREW RELINE-O POLY 5.5X40 (Screw) ×8 IMPLANT
SPONGE LAP 4X18 RFD (DISPOSABLE) ×2 IMPLANT
SPONGE SURGIFOAM ABS GEL 100 (HEMOSTASIS) ×3 IMPLANT
STRIP ILIUM TRICORT 2.2CMX60MM (Neuro Prosthesis/Implant) ×2 IMPLANT
SUT PROLENE 6 0 BV (SUTURE) IMPLANT
SUT VIC AB 1 CT1 18XBRD ANBCTR (SUTURE) ×1 IMPLANT
SUT VIC AB 1 CT1 8-18 (SUTURE) ×2
SUT VIC AB 2-0 CP2 18 (SUTURE) ×3 IMPLANT
SUT VIC AB 3-0 SH 8-18 (SUTURE) ×3 IMPLANT
SYR 3ML LL SCALE MARK (SYRINGE) ×12 IMPLANT
TOWEL GREEN STERILE (TOWEL DISPOSABLE) ×3 IMPLANT
TOWEL GREEN STERILE FF (TOWEL DISPOSABLE) ×3 IMPLANT
TRAY FOLEY MTR SLVR 16FR STAT (SET/KITS/TRAYS/PACK) ×3 IMPLANT
WATER STERILE IRR 1000ML POUR (IV SOLUTION) ×3 IMPLANT

## 2019-04-20 NOTE — Progress Notes (Signed)
Paul Thornton is a 58 y.o. male patient admitted from ED awake, alert - oriented  X 4 - no acute distress noted.  VSS - Blood pressure 111/72, pulse 84, temperature 98.4 F (36.9 C), temperature source Oral, resp. rate 15, height 5\' 9"  (1.753 m), weight 42.5 kg, SpO2 100 %.    IV in place, occlusive dsg intact without redness. Patient able to verbalize understanding of risk associated with falls, and verbalized understanding to call nsg before up out of bed.  Call light within reach, patient able to voice, and demonstrate understanding.  Skin, clean-dry- intact without evidence of bruising, or skin tears.       Will cont to eval and treat per MD orders.  [name], RN 04/20/2019 6:57 PM

## 2019-04-20 NOTE — Consult Note (Addendum)
Reason for Consult: Progressive paralysis Referring Physician: Clayburn Pert, MD  Paul Thornton is an 58 y.o. male.  HPI: Patient is a 58 year old right-handed individual who tells me that he has had some element of back pain and chest wall pain on the right side for several months time.  This is become progressively worse and a few days ago he started note that he had increasing back pain in the upper thoracic spine and he started develop weakness in his legs.  He was admitted to the hospital yesterday where he underwent an MRI of his thoracic spine that demonstrates a large mass in the right chest cavity eroding through the vertebrae from T3-T5.  At T4 and T5 there is significant erosion of the pedicles and compression of the spinal cord.  I was contacted this afternoon regarding the patient's situation and the fact that his legs are exceedingly weak the patient notes that he is not been able to walk independently for 2 days and he has been requiring significant help with mobilization while in the hospital.  Past Medical History:  Diagnosis Date  . Bronchitis   . COPD (chronic obstructive pulmonary disease) ([place])     History reviewed. No pertinent surgical history.  History reviewed. No pertinent family history.  Social History:  reports that he has been smoking. He has been smoking about 0.25 packs per day. He has never used smokeless tobacco. He reports that he does not drink alcohol or use drugs.  Allergies:  Allergies  Allergen Reactions  . Amoxicillin Hives    All "cillins"  . Penicillins Hives    Did it involve swelling of the face/tongue/throat, SOB, or low BP? No Did it involve sudden or severe rash/hives, skin peeling, or any reaction on the inside of your mouth or nose? No Did you need to seek medical attention at a hospital or doctor's office? No When did it last happen? If all above answers are "NO", may proceed with cephalosporin use.    Medications: I have  reviewed the patient's current medications.  Results for orders placed or performed during the hospital encounter of 04/19/19 (from the past 48 hour(s))  Brain natriuretic peptide     Status: None   Collection Time: 04/19/19 11:14 AM  Result Value Ref Range   B Natriuretic Peptide 34.0 0.0 - 100.0 pg/mL    Comment: Performed at [facility], [address] [place], [place] [address]  CBC with Differential     Status: Abnormal   Collection Time: 04/19/19 11:14 AM  Result Value Ref Range   WBC 11.8 (H) 4.0 - 10.5 K/uL   RBC 4.08 (L) 4.22 - 5.81 MIL/uL   Hemoglobin 11.3 (L) 13.0 - 17.0 g/dL   HCT 37.3 (L) 39.0 - 52.0 %   MCV 91.4 80.0 - 100.0 fL   MCH 27.7 26.0 - 34.0 pg   MCHC 30.3 30.0 - 36.0 g/dL   RDW 14.3 11.5 - 15.5 %   Platelets 409 (H) 150 - 400 K/uL   nRBC 0.0 0.0 - 0.2 %   Neutrophils Relative % 82 %   Neutro Abs 9.8 (H) 1.7 - 7.7 K/uL   Lymphocytes Relative 13 %   Lymphs Abs 1.5 0.7 - 4.0 K/uL   Monocytes Relative 3 %   Monocytes Absolute 0.4 0.1 - 1.0 K/uL   Eosinophils Relative 0 %   Eosinophils Absolute 0.0 0.0 - 0.5 K/uL   Basophils Relative 1 %   Basophils Absolute 0.1 0.0 - 0.1  K/uL   Immature Granulocytes 1 %   Abs Immature Granulocytes 0.08 (H) 0.00 - 0.07 K/uL    Comment: Performed at [facility], [address] [place], [place] [address]  Comprehensive metabolic panel     Status: Abnormal   Collection Time: 04/19/19 11:14 AM  Result Value Ref Range   Sodium 141 135 - 145 mmol/L   Potassium 4.2 3.5 - 5.1 mmol/L   Chloride 99 98 - 111 mmol/L   CO2 27 22 - 32 mmol/L   Glucose, Bld 122 (H) 70 - 99 mg/dL   BUN 11 6 - 20 mg/dL   Creatinine, Ser 0.66 0.61 - 1.24 mg/dL   Calcium 8.9 8.9 - 10.3 mg/dL   Total Protein 7.2 6.5 - 8.1 g/dL   Albumin 2.5 (L) 3.5 - 5.0 g/dL   AST 28 15 - 41 U/L   ALT 36 0 - 44 U/L   Alkaline Phosphatase 137 (H) 38 - 126 U/L   Total Bilirubin 0.6 0.3 - 1.2 mg/dL   GFR calc non Af Amer >60 >60 mL/min   GFR calc Af Amer >60 >60  mL/min   Anion gap 15 5 - 15    Comment: Performed at [facility], [address] [place], [place] [address]  D-dimer, quantitative (not at [facility])     Status: Abnormal   Collection Time: 04/19/19 11:14 AM  Result Value Ref Range   D-Dimer, Quant 1.09 (H) 0.00 - 0.50 ug/mL-FEU    Comment: (NOTE) At the manufacturer cut-off of 0.50 ug/mL FEU, this assay has been documented to exclude PE with a sensitivity and negative predictive value of 97 to 99%.  At this time, this assay has not been approved by the FDA to exclude DVT/VTE. Results should be correlated with clinical presentation. Performed at [facility], [address] [place], [place] [address]   SARS Coronavirus 2 ([place]- Performed in [facility] hospital lab), Hosp Order     Status: None   Collection Time: 04/19/19 11:42 AM   Specimen: Nasopharyngeal Swab  Result Value Ref Range   SARS Coronavirus 2 NEGATIVE NEGATIVE    Comment: (NOTE) If result is NEGATIVE SARS-CoV-2 target nucleic acids are NOT DETECTED. The SARS-CoV-2 RNA is generally detectable in upper and lower  respiratory specimens during the acute phase of infection. The lowest  concentration of SARS-CoV-2 viral copies this assay can detect is 250  copies / mL. A negative result does not preclude SARS-CoV-2 infection  and should not be used as the sole basis for treatment or other  patient management decisions.  A negative result may occur with  improper specimen collection / handling, submission of specimen other  than nasopharyngeal swab, presence of viral mutation(s) within the  areas targeted by this assay, and inadequate number of viral copies  (<250 copies / mL). A negative result must be combined with clinical  observations, patient history, and epidemiological information. If result is POSITIVE SARS-CoV-2 target nucleic acids are DETECTED. The SARS-CoV-2 RNA is generally detectable in upper and lower  respiratory specimens dur ing the acute phase of  infection.  Positive  results are indicative of active infection with SARS-CoV-2.  Clinical  correlation with patient history and other diagnostic information is  necessary to determine patient infection status.  Positive results do  not rule out bacterial infection or co-infection with other viruses. If result is PRESUMPTIVE POSTIVE SARS-CoV-2 nucleic acids MAY BE PRESENT.   A presumptive positive result was obtained on the submitted specimen  and confirmed on repeat testing.  While 2019 novel coronavirus  (SARS-CoV-2) nucleic acids may be present in the submitted sample  additional confirmatory testing may be necessary for epidemiological  and / or clinical management purposes  to differentiate between  SARS-CoV-2 and other Sarbecovirus currently known to infect humans.  If clinically indicated additional testing with an alternate test  methodology [phone]) is advised. The SARS-CoV-2 RNA is generally  detectable in upper and lower respiratory sp ecimens during the acute  phase of infection. The expected result is Negative. Fact Sheet for Patients:  StrictlyIdeas.no Fact Sheet for Healthcare Providers: BankingDealers.co.za This test is not yet approved or cleared by the [place] FDA and has been authorized for detection and/or diagnosis of SARS-CoV-2 by FDA under an Emergency Use Authorization (EUA).  This EUA will remain in effect (meaning this test can be used) for the duration of the COVID-19 declaration under Section 564(b)(1) of the Act, 21 U.S.C. section 360bbb-3(b)(1), unless the authorization is terminated or revoked sooner. Performed at [facility], [address], [place], [place] [address]   CBC     Status: Abnormal   Collection Time: 04/20/19  4:46 AM  Result Value Ref Range   WBC 10.2 4.0 - 10.5 K/uL   RBC 3.69 (L) 4.22 - 5.81 MIL/uL   Hemoglobin 10.3 (L) 13.0 - 17.0 g/dL   HCT 33.2 (L) 39.0 - 52.0 %   MCV 90.0  80.0 - 100.0 fL   MCH 27.9 26.0 - 34.0 pg   MCHC 31.0 30.0 - 36.0 g/dL   RDW 14.3 11.5 - 15.5 %   Platelets 406 (H) 150 - 400 K/uL   nRBC 0.0 0.0 - 0.2 %    Comment: Performed at [facility], [address], [place], [place] [address]  Basic metabolic panel     Status: Abnormal   Collection Time: 04/20/19  4:46 AM  Result Value Ref Range   Sodium 137 135 - 145 mmol/L   Potassium 3.8 3.5 - 5.1 mmol/L   Chloride 103 98 - 111 mmol/L   CO2 23 22 - 32 mmol/L   Glucose, Bld 127 (H) 70 - 99 mg/dL   BUN 13 6 - 20 mg/dL   Creatinine, Ser 0.59 (L) 0.61 - 1.24 mg/dL   Calcium 8.9 8.9 - 10.3 mg/dL   GFR calc non Af Amer >60 >60 mL/min   GFR calc Af Amer >60 >60 mL/min   Anion gap 11 5 - 15    Comment: Performed at [facility], [address], [place], [place] [address]  TSH     Status: None   Collection Time: 04/20/19  4:46 AM  Result Value Ref Range   TSH 0.876 0.350 - 4.500 uIU/mL    Comment: Performed by a 3rd Generation assay with a functional sensitivity of <=0.01 uIU/mL. Performed at [facility], [address] [place], [place] [address]   Hemoglobin A1c     Status: Abnormal   Collection Time: 04/20/19  4:46 AM  Result Value Ref Range   Hgb A1c MFr Bld 6.0 (H) 4.8 - 5.6 %    Comment: (NOTE) Pre diabetes:          5.7%-6.4% Diabetes:              >6.4% Glycemic control for   <7.0% adults with diabetes    Mean Plasma Glucose 125.5 mg/dL    Comment: Performed at [facility] [address] [place], [place] [address]  Protime-INR  Status: None   Collection Time: 04/20/19  6:36 AM  Result Value Ref Range   Prothrombin Time 13.3 11.4 - 15.2 seconds   INR 1.0 0.8 - 1.2    Comment: (NOTE) INR goal varies based on device and disease states. Performed at [facility], [address], [place], [place] [address]     Ct Angio Chest Pe W And/or Wo Contrast  Result Date: 04/19/2019 CLINICAL DATA:  Chest pain EXAM: CT ANGIOGRAPHY CHEST WITH CONTRAST TECHNIQUE: Multidetector  CT imaging of the chest was performed using the standard protocol during bolus administration of intravenous contrast. Multiplanar CT image reconstructions and MIPs were obtained to evaluate the vascular anatomy. CONTRAST:  144mL OMNIPAQUE IOHEXOL 350 MG/ML SOLN COMPARISON:  April 19, 2019 chest radiograph FINDINGS: Cardiovascular: There is no demonstrable pulmonary embolus. There is no thoracic aortic aneurysm or dissection. The visualized great vessels appear unremarkable. There is no pericardial effusion or pericardial thickening. Mediastinum/Nodes: Thyroid appears unremarkable. There is an aortopulmonary lymph node measuring 1.7 x 1.5 cm. There are subcentimeter right hilar lymph nodes. No esophageal lesion evident. Lungs/Pleura: There is underlying centrilobular and paraseptal emphysematous change. There are bullae in the upper lobes as well. There is a mass arising in the posterior segment of the right lower lobe which invades the right paratracheal region of the mediastinum as well as the posterior right pleura in the right upper hemithorax. There is extensive bony destruction involving portions of the right fourth and fifth ribs as well as bony destruction and the T4 and T5 vertebral bodies. There is marked collapse of the T4 vertebral body with extensive tumor throughout this area due to the adjacent large mass. This mass measures 6.9 cm from right to left dimension, 7.2 cm from superior to inferior dimension, and 6.2 cm from anterior to posterior dimension. There is patchy atelectasis and probable mild postobstructive pneumonitis in the posterior segment right upper lobe. No similar mass lesions are noted elsewhere. No evident pleural effusion. Upper Abdomen: There is hepatic steatosis. Visualized upper abdominal structures otherwise appear unremarkable. Musculoskeletal: Bony destruction involving portions of the T5 and T4 vertebral bodies with marked collapse of the T4 vertebral body. There is destruction  of portions of the posterior right fourth and fifth ribs due to the large tumor in this area. There is destruction of portions of the posterior elements at T5 on the right. Other bony structures appear unremarkable. No chest wall lesions are evident. Review of the MIP images confirms the above findings. IMPRESSION: 1. No demonstrable pulmonary embolus. No thoracic aortic aneurysm or dissection. 2. 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 Marked collapse of the T4 vertebral body noted. Invasion of the posterior elements at T5 on the right noted. This mass measures 7.2 x 6.2 x 6.9 cm. 3.  Extensive underlying emphysematous change. 4.  Aortopulmonary window region adenopathy. 5.  Hepatic steatosis. Emphysema (ICD10-J43.9). Electronically Signed   By: Lowella Grip III M.D.   On: 04/19/2019 14:56   Mr Angio Head Wo Contrast  Result Date: 04/20/2019 CLINICAL DATA:  Infarcts.  Weakness.  Headache. EXAM: MRA HEAD WITHOUT CONTRAST TECHNIQUE: Angiographic images of the Circle of Willis were obtained using MRA technique without intravenous contrast. COMPARISON:  MRI brain reported separately. FINDINGS: The internal carotid arteries are  widely patent. The basilar artery is widely patent with vertebrals codominant. The anterior cerebral arteries are widely patent, and the anterior communicating artery connects the distal A1 segments. There is a 50-75% stenosis at the proximal A2 LEFT ACA. The proximal RIGHT and LEFT M1 MCA segments are widely patent. Both superior and inferior MCA divisions are patent. There is moderate irregularity of the M3 segments bilaterally, RIGHT greater than LEFT consistent with intracranial atherosclerotic disease. Unremarkable posterior cerebral  arteries. No cerebellar branch occlusion. No saccular aneurysm. IMPRESSION: Mild intracranial atherosclerotic disease. No proximal flow-limiting stenosis, occlusion, or dissection. Electronically Signed   By: Staci Righter M.D.   On: 04/20/2019 13:45   Mr Jeri Cos CX Contrast  Result Date: 04/20/2019 CLINICAL DATA:  Lung cancer staging. EXAM: MRI HEAD WITHOUT AND WITH CONTRAST TECHNIQUE: Multiplanar, multiecho pulse sequences of the brain and surrounding structures were obtained without and with intravenous contrast. CONTRAST:  Gadavist 4 mL. COMPARISON:  MRA intracranial reported separately. Thoracic spine MRI reported separately. FINDINGS: Brain: Subcentimeter foci of restricted diffusion RIGHT frontal cortex and subcortical white matter, corresponding low ADC, no similar findings on the LEFT, nor in the posterior circulation consistent with acute RIGHT MCA territory infarcts. These are adjacent to areas of chronic infarction, with brain substance loss and encephalomalacia. Lobe is chronic appearing infarcts are seen elsewhere in the RIGHT hemisphere, both MCA, and PCA territory. Shower of emboli is suspected. No acute hemorrhage, mass lesion, hydrocephalus, or extra-axial fluid. Premature for age atrophy. T2 and FLAIR hyperintensities in the white matter, predominantly affecting the pons/brainstem, query chronic microvascular ischemic change. Post infusion, no abnormal enhancement of the brain or meninges is detected. The small infarcts do not clearly enhance. There is no visible metastatic disease. Vascular: Reported separately. Skull and upper cervical spine: Normal marrow signal. Sinuses/Orbits: No significant sinus disease.  Negative orbits. Other: No significant mastoid pathology.  Unremarkable nasopharynx. IMPRESSION: Small subcentimeter acute RIGHT frontal MCA territory infarcts, nonhemorrhagic. 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 Premature for age atrophy. White matter disease disproportionally affects the brainstem, raising the question of chronic microvascular ischemic change. No abnormal intracranial enhancement of the brain or meninges to suggest metastatic disease. Electronically Signed   By: Staci Righter M.D.   On: 04/20/2019 13:12   Mr Thoracic Spine W Wo Contrast  Result Date: 04/20/2019 CLINICAL DATA:  Abnormal CT chest. Chest pain. Shortness of breath. History of tobacco use. Emphysema. EXAM: MRI THORACIC WITHOUT AND WITH CONTRAST TECHNIQUE: Multiplanar and multiecho pulse sequences of the thoracic spine were obtained without and with intravenous contrast. CONTRAST:  Gadavist 4 mL. COMPARISON:  CT chest 04/19/2019 FINDINGS: MRI THORACIC SPINE FINDINGS Alignment: Kyphotic angulation centered at T4 due to partial collapse due to pathologic fracture. Vertebrae: Metastatic disease involving T3, T4, and T5 related to a large paravertebral mass. Pathologic fracture T4 with retropulsion of 2 mm estimated. Bowing of the posterior wall T5. Suspected metastasis at T12, LEFT hemi vertebra, 12 mm. 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 Epidural tumor extends from mid T3 through the T5-6 disc space. There is abnormal cord signal, maximal at T4, but extending cephalad as far as the T2-3 disc space. Paraspinal and other soft tissues: Large paravertebral mass on the RIGHT, correlating with the CT abnormality, measuring 69 x 79 x 75 mm. Destruction of the T3, T4, and T5 vertebral bodies and  posterior elements. Associated RIGHT-sided rib destruction and invasion of paravertebral musculature. Extension into the posterior mediastinum. Small RIGHT effusion. Disc levels: No compressive disc herniation or stenosis. IMPRESSION: 7 x 8 cm paravertebral mass centered at T4 on the RIGHT. Invasion of T3, T4, and T5. Pathologic T4 compression fracture. 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 Neurosurgical consultation is warranted. No compressive disc herniation. Suspected additional 12 mm LEFT T12 metastasis, incompletely evaluated. A call has been placed to the ordering provider. Electronically Signed   By: Staci Righter M.D.   On: 04/20/2019 13:26   US Carotid Bilateral  Result Date: 04/20/2019 CLINICAL DATA:  58 year old male with a history of bilateral upper extremity weakness EXAM: BILATERAL CAROTID DUPLEX ULTRASOUND TECHNIQUE: Pearline Cables scale imaging, color Doppler and duplex ultrasound were performed of bilateral carotid and vertebral arteries in the neck. COMPARISON:  None. FINDINGS: Criteria: Quantification of carotid stenosis is based on velocity parameters that correlate the residual internal carotid diameter with NASCET-based stenosis levels, using the diameter of the distal internal carotid lumen as the denominator for stenosis measurement. The following velocity measurements were obtained: RIGHT ICA:  Systolic 85 cm/sec, Diastolic 32 cm/sec CCA:  92 cm/sec SYSTOLIC ICA/CCA RATIO:  0.9 ECA:  79 cm/sec LEFT ICA:  Systolic 87 cm/sec, Diastolic 33 cm/sec CCA:  109 cm/sec SYSTOLIC ICA/CCA RATIO:  0.8 ECA:  85 cm/sec Right Brachial SBP: Not acquired Left Brachial SBP: Not acquired RIGHT CAROTID ARTERY: No significant calcified disease of the right common carotid artery. Intermediate waveform maintained. Heterogeneous plaque without significant calcifications at the right carotid bifurcation. Low resistance waveform of the right ICA. No significant tortuosity. RIGHT VERTEBRAL ARTERY: Antegrade flow with low resistance waveform. LEFT CAROTID ARTERY: No significant calcified disease of the left common carotid artery. Intermediate waveform maintained. Heterogeneous plaque at the left carotid bifurcation without significant calcifications. Low resistance waveform of the left ICA. LEFT VERTEBRAL  ARTERY:  Antegrade flow with low resistance waveform. IMPRESSION: Color duplex indicates minimal heterogeneous plaque, with no hemodynamically significant stenosis by duplex criteria in the extracranial cerebrovascular circulation. Signed, Dulcy Fanny. Dellia Nims, RPVI [facility] Electronically Signed   By: Corrie Mckusick D.O.   On: 04/20/2019 12:37   Ct Biopsy  Result Date: 04/20/2019 INDICATION: Large right lung mass with involvement of the posterior chest wall. Tissue diagnosis is needed. EXAM: CT-GUIDED BIOPSY OF RIGHT LUNG MASS MEDICATIONS: None. ANESTHESIA/SEDATION: Moderate (conscious) sedation was employed during this procedure. A total of Versed 1.0 mg and Fentanyl 50 mcg was administered intravenously. Moderate Sedation Time: 16 minutes. The patient's level of consciousness and vital signs were monitored continuously by radiology nursing throughout the procedure under my direct supervision. FLUOROSCOPY TIME:  None COMPLICATIONS: None immediate. PROCEDURE: Informed written consent was obtained from the patient after a thorough discussion of the procedural risks, benefits and alternatives. All questions were addressed. Maximal Sterile Barrier Technique was utilized including caps, mask, sterile gowns, sterile gloves, sterile drape, hand hygiene and skin antiseptic. A timeout was performed prior to the initiation of the procedure. Patient was placed prone. CT images through the chest were obtained. The right side of the back was prepped with chlorhexidine and sterile field was created. Skin and soft tissues anesthetized with 1% lidocaine. Using CT guidance, 17 gauge coaxial needle was directed into the lateral aspect of the mass. Needle position was confirmed within the lesion. A total of 3 core biopsies were obtained with an  18 gauge core device. Specimens placed in formalin. 17 gauge coaxial needle was removed without complication. Bandage placed  over the puncture site. FINDINGS: Large mass in the posterior right chest with invasion of the posterior chest wall and ribs. Patient has severe emphysema. Needle position confirmed within the lesion. Negative for pneumothorax following the core biopsies. Three adequate core biopsies were obtained. IMPRESSION: CT-guided core biopsy of the right lung mass. Electronically Signed   By: Markus Daft M.D.   On: 04/20/2019 16:23   Dg Chest Port 1 View  Result Date: 04/20/2019 CLINICAL DATA:  58 year old male with a history of chest wall mass. EXAM: PORTABLE CHEST 1 VIEW COMPARISON:  04/19/2019, CT 04/20/2019, 04/19/2011 FINDINGS: Cardiomediastinal silhouette unchanged. Opacity/density at the upper right mediastinum is unchanged from the comparison, compatible with malignancy identified on prior CT. No pneumothorax. No confluent airspace disease. No pleural effusion. Destructive changes of the posterior right ribs/vertebral bodies better characterized on prior CT. IMPRESSION: No complicating features status post CT-guided chest wall mass biopsy. Electronically Signed   By: Corrie Mckusick D.O.   On: 04/20/2019 16:59   Dg Chest Portable 1 View  Result Date: 04/19/2019 CLINICAL DATA:  Shortness of breath, generalized weakness for 2 days, COPD, RIGHT-side pain, cough, smoker EXAM: PORTABLE CHEST 1 VIEW COMPARISON:  01/28/2014 FINDINGS: Normal heart size and pulmonary vascularity. Medial RIGHT upper lobe mass 6.8 x 4.8 cm highly worrisome for pulmonary neoplasm. Underlying emphysematous changes consistent with history of COPD. RIGHT apical scarring. No infiltrate, pleural effusion, or pneumothorax. Bones demineralized. IMPRESSION: COPD changes with RIGHT upper lobe scarring. Large medial RIGHT upper lobe mass measuring 6.8 x 4.8 cm highly worrisome for a primary pulmonary neoplasm; further evaluation by CT chest recommended, with contrast if patient's renal function permits. Findings called to Dr. Reather Converse on 04/19/2019 at  1200 hours. Electronically Signed   By: Lavonia Dana M.D.   On: 04/19/2019 12:02    Review of Systems  Constitutional: Negative.   HENT: Negative.   Eyes: Negative.   Respiratory: Positive for shortness of breath.   Cardiovascular: Positive for chest pain.  Gastrointestinal: Negative.   Musculoskeletal: Positive for back pain.  Skin: Negative.   Neurological: Positive for weakness.  Endo/Heme/Allergies: Negative.   Psychiatric/Behavioral: Negative.    Blood pressure 111/72, pulse 84, temperature 98.4 F (36.9 C), temperature source Oral, resp. rate 15, height 5\' 9"  (1.753 m), weight 42.5 kg, SpO2 95 %. Physical Exam  Constitutional: He is oriented to person, place, and time. He appears well-developed and well-nourished.  HENT:  Head: Normocephalic and atraumatic.  Eyes: Pupils are equal, round, and reactive to light. Conjunctivae and EOM are normal.  Neck: Normal range of motion. Neck supple.  Musculoskeletal:     Comments: Market pain to palpation on the upper thoracic spine.  Neurological: He is alert and oriented to person, place, and time.  Upper extremity strength is intact in the deltoids biceps triceps grips and intrinsics with normal tone and bulk reflexes are 1+ in the biceps and triceps 1+ brachioradialis lower extremity strength reveals market weakness with 1 out of 5 motion in the left lower extremity ability to flex his muscles with little low movement and no antigravity movement in the left leg on the right lower extremity has minimal antigravity strength in the iliopsoas barely graded at 3 out of 5 quadricep is graded 2 out of 5 the tibialis anterior is graded 2 out of 5 plantar flexors graded 2 out of 5.  Sensation is diminished  up the chest wall more so on the right side than on the left side.  Rectal exam was not performed at this time though the patient has an indwelling Foley catheter because of urinary incontinence.    Assessment/Plan: Metastatic lung cancer to the  thoracic spine at T3-T4 and T5.  Paralysis.  Plan: Debulking of the tumor so as to create a separation between the tumor and the spinal cord to allow further treatment and hopefully preserve his neurologic function.  I note that given the patient's poor functional status at the current time his prognosis is extremely guarded for independent ambulation however if nothing further is done at the current time then he will likely become paralyzed in a short period of time.  Blanchie Dessert Lillyann Ahart 04/20/2019, 8:08 PM

## 2019-04-20 NOTE — Sedation Documentation (Signed)
Patient attempted to urinate, was only able to get out 100cc.  Pt stated difficulty this AM with urinating and had to be straight cathed to empty the bladder.  Pt requesting straight cath.   Notified Pam T. PA and Lujean Rave. PA.  Order received to straight cath.

## 2019-04-20 NOTE — Progress Notes (Signed)
Spoke with CCM Dr. Lucile Shutters at [facility].  Made him aware that patient would be going to 4N post op.  Called into OR 21 and instructed the circulator to call Dr. Lucile Shutters [phone]) at the end of the case so CCM could consult on the unit.

## 2019-04-20 NOTE — Anesthesia Procedure Notes (Signed)
Procedure Name: Intubation Date/Time: 04/20/2019 9:44 PM Performed by: Clovis Cao, CRNA Pre-anesthesia Checklist: Patient identified, Emergency Drugs available, Suction available, Patient being monitored and Timeout performed Patient Re-evaluated:Patient Re-evaluated prior to induction Oxygen Delivery Method: Circle system utilized Preoxygenation: Pre-oxygenation with 100% oxygen Induction Type: IV induction, Rapid sequence and Cricoid Pressure applied Laryngoscope Size: Glidescope Grade View: Grade I Tube type: Subglottic suction tube Tube size: 8.5 mm Number of attempts: 2 (see note) Airway Equipment and Method: Stylet and Video-laryngoscopy Placement Confirmation: ETT inserted through vocal cords under direct vision,  positive ETCO2 and breath sounds checked- equal and bilateral Secured at: 22 cm Tube secured with: Tape Dental Injury: Teeth and Oropharynx as per pre-operative assessment

## 2019-04-20 NOTE — Progress Notes (Signed)
Initial Nutrition Assessment  DOCUMENTATION CODES:  Underweight     INTERVENTION:  When pt returns and diet advanced RD will assess further   and provide nutrition education.  Recommend liberalized diet when appropriate  NUTRITION DIAGNOSIS:   Increased nutrient needs related to cancer and cancer related treatments as evidenced by estimated needs.   GOAL:  Patient will meet greater than or equal to 90% of their needs  MONITOR:  Diet advancement, Labs, Weight trends   REASON FOR ASSESSMENT:   Consult Assessment of nutrition requirement/status  ASSESSMENT: patient is an underweight 58 yo male who  presents with a hx of COPD, GERD and unplanned wt loss without change in appetite. Patient may require a calorie count to assess actual intake once diet is advanced. Tobacco abuse.   CT- findings significant for right lung mass with metastasis.   Patient is out to [facility] IR for his lung biopsy and earlier this morning he was out of his room for MRI. RD called the number in chart for his mother but the woman who answered the telephone says her son is not in the hospital and we have the wrong number.  Patient weight history- will follow with patient tomorrow. Talked with his nurse today who expects he will be gone for several hours and may not return before RD leaves for the day. He is severely underweight based on hospital records. PT / OT notes reviewed. Suspect malnutrition related chronic disease (lung cancer) but will not be able to clearly define until additional data is gathered.  Medications include: prednisone, doxycycline, Protonix and Nicoderm.  Labs: BMP Latest Ref Rng & Units 04/20/2019 04/19/2019  Glucose 70 - 99 mg/dL 127(H) 122(H)  BUN 6 - 20 mg/dL 13 11  Creatinine 0.61 - 1.24 mg/dL 0.59(L) 0.66  Sodium 135 - 145 mmol/L 137 141  Potassium 3.5 - 5.1 mmol/L 3.8 4.2  Chloride 98 - 111 mmol/L 103 99  CO2 22 - 32 mmol/L 23 27  Calcium 8.9 - 10.3 mg/dL 8.9 8.9      NUTRITION - FOCUSED PHYSICAL EXAM:  Unable to complete Nutrition-Focused physical exam at this time.  Pt is out of the hospital for biopsy.  Diet Order:   Diet Order            Diet NPO time specified  Diet effective midnight              EDUCATION NEEDS: will address during f/u    Skin:  Skin Assessment: Reviewed RN Assessment  Last BM:  6/22  Height:   Ht Readings from Last 1 Encounters:  04/19/19 5\' 9"  (1.753 m)    Weight:   Wt Readings from Last 1 Encounters:  04/19/19 40.8 kg    Ideal Body Weight:  73 kg  BMI:  Body mass index is 13.29 kg/m.  Estimated Nutritional Needs:   Kcal:  4665-9935  Protein:  74-82 gr  Fluid:  >1400 ml daily   Colman Cater MS,RD,CSG,LDN Office: [phone] Pager: [phone]

## 2019-04-20 NOTE — Consult Note (Signed)
Chief Complaint: Patient was seen in consultation today for right lung mass biopsy at the request of [place]  Referring Physician(s): [place]  Supervising Physician: Markus Daft  Patient Status: [facility] IP  History of Present Illness: Paul Thornton is a 58 y.o. male   ++smoker COPD To [facility] ED with SOB and cough covid neg yesterday Wt loss x 3 months Right side chest discomfort and back pain  CTA yesterday:  IMPRESSION: 1. No demonstrable pulmonary embolus. No thoracic aortic aneurysm or dissection. 2. 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 Marked collapse of the T4 vertebral body noted. Invasion of the posterior elements at T5 on the right noted. This mass measures 7.2 x 6.2 x 6.9 cm. 3.  Extensive underlying emphysematous change. 4.  Aortopulmonary window region adenopathy. 5.  Hepatic steatosis.  TRH and Oncology requesting biopsy and staging  Imaging reviewed by [facility]-- approved Rt lung mass bx   Past Medical History:  Diagnosis Date   Bronchitis    COPD (chronic obstructive pulmonary disease) ([place])     No past surgical history on file.  Allergies: Amoxicillin and Penicillins  Medications: Prior to Admission medications   Medication Sig Start Date End Date Taking? Authorizing Provider  albuterol (PROVENTIL HFA;VENTOLIN HFA) 108 (90 BASE) MCG/ACT inhaler Inhale 1-2 puffs into the lungs every 6 (six) hours as needed for wheezing or shortness of breath. Patient not taking: Reported on 04/19/2019 01/28/14   Ripley Fraise, MD  predniSONE (DELTASONE) 50 MG tablet One tablet PO daily for 4 days Patient not taking: Reported on 04/19/2019 01/28/14   Ripley Fraise, MD     No family history on  file.  Social History   Socioeconomic History   Marital status: Single    Spouse name: Not on file   Number of children: Not on file   Years of education: Not on file   Highest education level: Not on file  Occupational History   Not on file  Social Needs   Financial resource strain: Not on file   Food insecurity    Worry: Not on file    Inability: Not on file   Transportation needs    Medical: Not on file    Non-medical: Not on file  Tobacco Use   Smoking status: Current Every Day Smoker    Packs/day: 0.25   Smokeless tobacco: Never Used  Substance and Sexual Activity   Alcohol use: No   Drug use: No   Sexual activity: Not on file  Lifestyle   Physical activity    Days per week: Not on file    Minutes per session: Not on file   Stress: Not on file  Relationships   Social connections    Talks on phone: Not on file    Gets together: Not on file    Attends religious service: Not on file    Active member of club or organization: Not on file    Attends meetings of clubs or organizations: Not on file    Relationship status: Not on file  Other Topics Concern   Not on file  Social History Narrative   Not on file    Review of Systems: A 12 point ROS discussed and pertinent positives are indicated in the HPI above.  All other systems are negative.  Review of Systems  Constitutional: Positive for activity change, appetite change, fatigue and unexpected weight change.  HENT: Negative for trouble swallowing.   Respiratory: Positive for cough, shortness of breath and wheezing.   Cardiovascular: Positive for chest pain.  Musculoskeletal: Positive for back pain.  Neurological: Negative for weakness.  Psychiatric/Behavioral: Negative for behavioral problems and confusion.    Vital Signs: There were no vitals taken for this visit.  Physical Exam Vitals signs reviewed.  Constitutional:      Appearance: He is ill-appearing.  Cardiovascular:     Rate  and Rhythm: Normal rate and regular rhythm.     Heart sounds: Normal heart sounds.  Pulmonary:     Breath sounds: Normal breath sounds.  Abdominal:     Palpations: Abdomen is soft.  Musculoskeletal: Normal range of motion.  Skin:    General: Skin is warm and dry.  Neurological:     Mental Status: He is alert and oriented to person, place, and time.  Psychiatric:        Mood and Affect: Mood normal.        Behavior: Behavior normal.        Thought Content: Thought content normal.        Judgment: Judgment normal.     Imaging: Ct Angio Chest Pe W And/or Wo Contrast  Result Date: 04/19/2019 CLINICAL DATA:  Chest pain EXAM: CT ANGIOGRAPHY CHEST WITH CONTRAST TECHNIQUE: Multidetector CT imaging of the chest was performed using the standard protocol during bolus administration of intravenous contrast. Multiplanar CT image reconstructions and MIPs were obtained to evaluate the vascular anatomy. CONTRAST:  184mL OMNIPAQUE IOHEXOL 350 MG/ML SOLN COMPARISON:  April 19, 2019 chest radiograph FINDINGS: Cardiovascular: There is no demonstrable pulmonary embolus. There is no thoracic aortic aneurysm or dissection. The visualized great vessels appear unremarkable. There is no pericardial effusion or pericardial thickening. Mediastinum/Nodes: Thyroid appears unremarkable. There is an aortopulmonary lymph node measuring 1.7 x 1.5 cm. There are subcentimeter right hilar lymph nodes. No esophageal lesion evident. Lungs/Pleura: There is underlying centrilobular and paraseptal emphysematous change. There are bullae in the upper lobes as well. There is a mass arising in the posterior segment of the right lower lobe which invades the right paratracheal region of the mediastinum as well as the posterior right pleura in the right upper hemithorax. There is extensive bony destruction involving portions of the right fourth and fifth ribs as well as bony destruction and the T4 and T5 vertebral bodies. There is marked  collapse of the T4 vertebral body with extensive tumor throughout this area due to the adjacent large mass. This mass measures 6.9 cm from right to left dimension, 7.2 cm from superior to inferior dimension, and 6.2 cm from anterior to posterior dimension. There is patchy atelectasis and probable mild postobstructive pneumonitis in the posterior segment right upper lobe. No similar mass lesions are noted elsewhere. No evident pleural effusion. Upper Abdomen: There is hepatic steatosis. Visualized upper abdominal structures otherwise appear unremarkable. Musculoskeletal: Bony destruction involving portions of the T5 and T4 vertebral bodies with marked collapse of the T4 vertebral body. There is destruction of portions of the posterior right fourth and fifth ribs due to the large tumor in this area. There is destruction of portions of the posterior elements at T5 on the right. Other bony structures appear unremarkable. No chest wall lesions are evident. Review of the MIP images confirms the above findings. IMPRESSION: 1. No demonstrable pulmonary embolus. No  thoracic aortic aneurysm or dissection. 2. 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 Marked collapse of the T4 vertebral body noted. Invasion of the posterior elements at T5 on the right noted. This mass measures 7.2 x 6.2 x 6.9 cm. 3.  Extensive underlying emphysematous change. 4.  Aortopulmonary window region adenopathy. 5.  Hepatic steatosis. Emphysema (ICD10-J43.9). Electronically Signed   By: Lowella Grip III M.D.   On: 04/19/2019 14:56   Mr Jeri Cos FT Contrast  Result Date: 04/20/2019 CLINICAL DATA:  Lung cancer staging. EXAM: MRI HEAD WITHOUT AND WITH CONTRAST TECHNIQUE: Multiplanar, multiecho pulse sequences  of the brain and surrounding structures were obtained without and with intravenous contrast. CONTRAST:  Gadavist 4 mL. COMPARISON:  MRA intracranial reported separately. Thoracic spine MRI reported separately. FINDINGS: Brain: Subcentimeter foci of restricted diffusion RIGHT frontal cortex and subcortical white matter, corresponding low ADC, no similar findings on the LEFT, nor in the posterior circulation consistent with acute RIGHT MCA territory infarcts. These are adjacent to areas of chronic infarction, with brain substance loss and encephalomalacia. Lobe is chronic appearing infarcts are seen elsewhere in the RIGHT hemisphere, both MCA, and PCA territory. Shower of emboli is suspected. No acute hemorrhage, mass lesion, hydrocephalus, or extra-axial fluid. Premature for age atrophy. T2 and FLAIR hyperintensities in the white matter, predominantly affecting the pons/brainstem, query chronic microvascular ischemic change. Post infusion, no abnormal enhancement of the brain or meninges is detected. The small infarcts do not clearly enhance. There is no visible metastatic disease. Vascular: Reported separately. Skull and upper cervical spine: Normal marrow signal. Sinuses/Orbits: No significant sinus disease.  Negative orbits. Other: No significant mastoid pathology.  Unremarkable nasopharynx. IMPRESSION: Small subcentimeter acute RIGHT frontal MCA territory infarcts, nonhemorrhagic. 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 Premature for age atrophy. White matter disease disproportionally affects the brainstem, raising the question of chronic microvascular ischemic change. No abnormal intracranial enhancement of the brain or meninges to suggest metastatic disease. Electronically Signed   By: Staci Righter M.D.   On: 04/20/2019 13:12   US Carotid Bilateral  Result Date: 04/20/2019 CLINICAL DATA:  58 year old male with a history  of bilateral upper extremity weakness EXAM: BILATERAL CAROTID DUPLEX ULTRASOUND TECHNIQUE: Pearline Cables scale imaging, color Doppler and duplex ultrasound were performed of bilateral carotid and vertebral arteries in the neck. COMPARISON:  None. FINDINGS: Criteria: Quantification of carotid stenosis is based on velocity parameters that correlate the residual internal carotid diameter with NASCET-based stenosis levels, using the diameter of the distal internal carotid lumen as the denominator for stenosis measurement. The following velocity measurements were obtained: RIGHT ICA:  Systolic 85 cm/sec, Diastolic 32 cm/sec CCA:  92 cm/sec SYSTOLIC ICA/CCA RATIO:  0.9 ECA:  79 cm/sec LEFT ICA:  Systolic 87 cm/sec, Diastolic 33 cm/sec CCA:  732 cm/sec SYSTOLIC ICA/CCA RATIO:  0.8 ECA:  85 cm/sec Right Brachial SBP: Not acquired Left Brachial SBP: Not acquired RIGHT CAROTID ARTERY: No significant calcified disease of the right common carotid artery. Intermediate waveform maintained. Heterogeneous plaque without significant calcifications at the right carotid bifurcation. Low resistance waveform of the right ICA. No significant tortuosity. RIGHT VERTEBRAL ARTERY: Antegrade flow with low resistance waveform. LEFT CAROTID ARTERY: No significant calcified disease of  the left common carotid artery. Intermediate waveform maintained. Heterogeneous plaque at the left carotid bifurcation without significant calcifications. Low resistance waveform of the left ICA. LEFT VERTEBRAL ARTERY:  Antegrade flow with low resistance waveform. IMPRESSION: Color duplex indicates minimal heterogeneous plaque, with no hemodynamically significant stenosis by duplex criteria in the extracranial cerebrovascular circulation. Signed, Dulcy Fanny. Dellia Nims, RPVI [facility] Electronically Signed   By: Corrie Mckusick D.O.   On: 04/20/2019 12:37   Dg Chest Portable 1 View  Result Date: 04/19/2019 CLINICAL  DATA:  Shortness of breath, generalized weakness for 2 days, COPD, RIGHT-side pain, cough, smoker EXAM: PORTABLE CHEST 1 VIEW COMPARISON:  01/28/2014 FINDINGS: Normal heart size and pulmonary vascularity. Medial RIGHT upper lobe mass 6.8 x 4.8 cm highly worrisome for pulmonary neoplasm. Underlying emphysematous changes consistent with history of COPD. RIGHT apical scarring. No infiltrate, pleural effusion, or pneumothorax. Bones demineralized. IMPRESSION: COPD changes with RIGHT upper lobe scarring. Large medial RIGHT upper lobe mass measuring 6.8 x 4.8 cm highly worrisome for a primary pulmonary neoplasm; further evaluation by CT chest recommended, with contrast if patient's renal function permits. Findings called to Dr. Reather Converse on 04/19/2019 at 1200 hours. Electronically Signed   By: Lavonia Dana M.D.   On: 04/19/2019 12:02    Labs:  CBC: Recent Labs    04/19/19 1114 04/20/19 0446  WBC 11.8* 10.2  HGB 11.3* 10.3*  HCT 37.3* 33.2*  PLT 409* 406*    COAGS: Recent Labs    04/20/19 0636  INR 1.0    BMP: Recent Labs    04/19/19 1114 04/20/19 0446  NA 141 137  K 4.2 3.8  CL 99 103  CO2 27 23  GLUCOSE 122* 127*  BUN 11 13  CALCIUM 8.9 8.9  CREATININE 0.66 0.59*  GFRNONAA >60 >60  GFRAA >60 >60    LIVER FUNCTION TESTS: Recent Labs    04/19/19 1114  BILITOT 0.6  AST 28  ALT 36  ALKPHOS 137*  PROT 7.2  ALBUMIN 2.5*    TUMOR MARKERS: No results for input(s): AFPTM, CEA, CA199, CHROMGRNA in the last 8760 hours.  Assessment and Plan:  Wt loss Right chest and back pain Cough and sob x months New right lung mass Scheduled now for biopsy and possible chest tube placement Risks and benefits of CT guided lung nodule biopsy was discussed with the patient including, but not limited to bleeding, hemoptysis, respiratory failure requiring intubation, infection, pneumothorax requiring chest tube placement, stroke from air embolism or even death.  All of the patient's questions  were answered and the patient is agreeable to proceed. Consent signed and in chart.  Thank you for this interesting consult.  I greatly enjoyed meeting NORIS KULINSKI and look forward to participating in their care.  A copy of this report was sent to the requesting provider on this date.  Electronically Signed: Lavonia Drafts, PA-C 04/20/2019, 1:23 PM   I spent a total of 40 Minutes    in face to face in clinical consultation, greater than 50% of which was counseling/coordinating care for right lung mass bx

## 2019-04-20 NOTE — Progress Notes (Signed)
Pt asked me to call his sister. I called her with updates and to let her know that he was transferred back to [facility]. She expressed that she wanted to speak with the doctor about the pt's plan of care.

## 2019-04-20 NOTE — Sedation Documentation (Signed)
Pt brought to CT 3, placed prone on table, padded bony prominences, secured to table.  Pt placed on 2L O2 via [place].  Pt placed on cont to cardiac monitor

## 2019-04-20 NOTE — Progress Notes (Signed)
Dr. Darrick Meigs returned call, ordered Xanax 0.5 mg with a sip of water to help patient rest.  P.J. Linus Mako, RN

## 2019-04-20 NOTE — Plan of Care (Signed)
  Problem: Acute Rehab PT Goals(only PT should resolve) Goal: Pt Will Go Supine/Side To Sit Outcome: Progressing Flowsheets (Taken 04/20/2019 1109) Pt will go Supine/Side to Sit: Independently Goal: Patient Will Transfer Sit To/From Stand Outcome: Progressing Flowsheets (Taken 04/20/2019 1109) Patient will transfer sit to/from stand:  with min guard assist  with minimal assist Goal: Pt Will Transfer Bed To Chair/Chair To Bed Outcome: Progressing Flowsheets (Taken 04/20/2019 1109) Pt will Transfer Bed to Chair/Chair to Bed:  min guard assist  with min assist Goal: Pt Will Ambulate Outcome: Progressing Flowsheets (Taken 04/20/2019 1109) Pt will Ambulate:  50 feet  with minimal assist  with rolling walker   11:09 AM, 04/20/19 Lonell Grandchild, MPT Physical Therapist with [facility] 336 [phone] office [phone] mobile phone

## 2019-04-20 NOTE — Progress Notes (Signed)
RN paged Dr. Darrick Meigs per patient's request to request something for sleep, awaiting response.  P.J. Linus Mako, RN

## 2019-04-20 NOTE — Consult Note (Signed)
Neurology Consultation Reason for Consult: Stroke Referring Physician: Dyann Kief, C  CC: Stroke  History is obtained from: Patient  HPI: Paul Thornton is a 58 y.o. male with a history of COPD and tobacco abuse who has noticed a weight loss for the past month as well as back and chest pain.  Over the past few days, he has noticed that his legs have become weak.  Due to this he presented to [facility] where a lung mass was found and was biopsied today.  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  He also had an MRI of his brain which reveals small punctate areas of ischemia.   LKW: Unclear tpa given?: no, unclear time of onset    ROS: A 14 point ROS was performed and is negative except as noted in the HPI.   Past Medical History:  Diagnosis Date  . Bronchitis   . COPD (chronic obstructive pulmonary disease) ([place])      History reviewed. No pertinent family history.   Social History:  reports that he has been smoking. He has been smoking about 0.25 packs per day. He has never used smokeless tobacco. He reports that he does not drink alcohol or use drugs.   Exam: Current vital signs: BP 117/75 (BP Location: Left Arm)   Pulse 88   Temp 98.8 F (37.1 C) (Oral)   Resp 20   Ht 5\' 9"  (1.753 m)   Wt 42.5 kg   SpO2 95%   BMI 13.85 kg/m  Vital signs in last 24 hours: Temp:  [97.4 F (36.3 C)-98.8 F (37.1 C)] 98.8 F (37.1 C) (06/25 2035) Pulse Rate:  [77-92] 88 (06/25 2035) Resp:  [14-21] 20 (06/25 2035) BP: (95-117)/(67-78) 117/75 (06/25 2035) SpO2:  [95 %-100 %] 95 % (06/25 2035) Weight:  [42.5 kg] 42.5 kg (06/25 1826)   Physical Exam  Constitutional: Appears well-developed and well-nourished.  Psych: Affect appropriate to situation Eyes: No scleral injection HENT: No OP obstrucion Head: Normocephalic.  Cardiovascular: Normal rate and regular rhythm.  Respiratory: Effort normal, non-labored breathing GI: Soft.  No  distension. There is no tenderness.  Skin: WDI  Neuro: Mental Status: Patient is awake, alert, oriented to person, place, month, year, and situation. Patient is able to give a clear and coherent history. No signs of aphasia or neglect Cranial Nerves: II: Visual Fields are full. Pupils are equal, round, and reactive to light.   III,IV, VI: EOMI without ptosis or diploplia.  V: Facial sensation is symmetric to temperature VII: Facial movement is symmetric.  VIII: hearing is intact to voice X: Uvula elevates symmetrically XI: Shoulder shrug is symmetric. XII: tongue is midline without atrophy or fasciculations.  Motor: Tone is normal. Bulk is normal. 5/5 strength was present in bilateral arms, he has full 3-4/5 strength in the right leg, 2-3/5 strength in the left leg Sensory: Sensation is diminished in the legs, he has loss of temperature sensation on the right but not left. 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  I suspect that his right carotid  is likely going to be found to be a culprit, however I would also investigate other etiologies such as cardioembolic source.  I am holding on antiplatelet load as he is currently being prepared to take to surgery for his thoracic lesion.  His exam is much more consistent with the thoracic lesion then with the stroke and I suspect this was found incidentally.  Recommendations: - HgbA1c, fasting lipid panel - MRI, MRA  of the brain without contrast - Frequent neuro checks - Echocardiogram - Carotid  dopplers - Prophylactic therapy-Antiplatelet med: Aspirin - dose 325mg  PO or 300mg  PR after surgery - Risk factor modification - Telemetry monitoring - PT consult, OT consult, Speech consult - Stroke team to follow    Roland Rack, MD [facility] [phone]  If 7pm- 7am, please page neurology on call as listed in [place].

## 2019-04-20 NOTE — Progress Notes (Signed)
PROGRESS NOTE    Paul Thornton  ZOX:[id number] DOB: 1961-06-18 DOA: 04/19/2019 PCP: Patient, No Pcp Per     Brief Narrative:  58 y.o. male with a past medical history significant for tobacco abuse, COPD and gastroesophageal reflux disease; presented to the emergency department secondary to increased shortness of breath, cough and weight loss.  Patient reports weight loss despite adequate oral intake in the last 3-85-month.  Reported shortness of breath, increased wheezing and coughing spells for the last 4 to 5 days, worsening.  No hemoptysis.  Patient reports right-sided chest discomfort, worse with movement and coughing, radiated to his back, 8 out of 10 in intensity, no alleviating factors.  Reports pain has been getting worse in the last 48 hours prior to admission.  Patient denies fever, chills, nausea, vomiting, dysuria, melena, hematochezia, sick contacts or any other complaints.  In the ED was found to be tachypneic, borderline hypoxic, with positive expiratory wheezing.  Oxygen supplementation and albuterol treatment provided with some improvement in his symptoms.  D-dimer was elevated, mild elevation of WBCs and bronchitic changes appreciated on chest x-ray.  There was positive findings of a right lung mass further stratified with a CT chest that demonstrated what appears to be metastatic lung cancer involving pleura, ribs and T4-T5 vertebral bodies.  TRH has been consulted to admit patient for further evaluation and management.   Assessment & Plan: 1-COPD exacerbation with borderline hypoxia -Breathing slowly improving -Able to speak in full sentences today -Less tachypneic with exertion -No using accessory muscle. -Continue the use of his steroids, doxycycline, Pulmicort, duo nebs and flutter valve -Follow clinical improvement.  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 for debulking surgery. -Biopsy pending to determine type of cancer -As part of further staging there was no metastases appreciated on patient's brain. -Outpatient PET scan will need to be arranged. -Further treatment to be determined once tissue sampling identification completed.  3-right frontal stroke -Acute on presentation -Mostly associated with increased coagulopathy from lung cancer and tobacco abuse -Will complete a stroke work-up checking 2D echo, TSH, lipid panel, A1c -Physical therapy recommending skilled nursing facility for rehabilitation -Neurology will see patient emesis: After transfer with further recommendations as part of secondary prevention. -Most likely will use aspirin once clear from biopsy and surgery standpoint.    4-GI prophylaxis and gastroesophageal reflux disease -Continue PPI  5-bronchiectasis/bronchitis -Continue treatment with doxycycline as mentioned above -Patient is afebrile.  6-elevated d-dimer and pleuritic chest pain -In the setting of lung cancer -No signs of pulmonary embolism appreciated on CT Angie  7-tobacco abuse -Extensive cessation counseling provided at time of admission -Continue the use of nicotine patch -Patient was receptive and is looking to quit.  DVT prophylaxis: Heparin Code Status: Full code Family Communication: No family at bedside. Disposition Plan: Patient will be transfer to [facility] in anticipation of debulking surgery of a thoracic spine due to severe spinal cord compression from metastatic lung cancer.  Neurology will see patient once a at [facility] to further provide recommendations from stroke standpoint. PT recommending SNF once medically stable for discharge for conditioning.   Consultants:   Oncology service  Neurosurgery  Neurology  Interventional radiology  Procedures:    See below for x-ray reports  -2D echo: Pending  Carotid Dopplers: Minimal heterogeneous plaque,  no hemodynamically significant stenosis by Doppler criteria in the extracranial cerebrovascular circulation.  Antegrade flow on vertebral artery appreciated bilaterally.  Antimicrobials:  Anti-infectives (From admission, onward)   Start     Dose/Rate Route Frequency Ordered Stop   04/19/19 1800  doxycycline (VIBRA-TABS) tablet 100 mg     100 mg Oral Every 12 hours 04/19/19 1545 04/24/19 2159       Subjective: No fever, no nausea, no vomiting.  Reports breathing is a stable at this time.  Using 1-2 L nasal cannula supplementation with good O2 sat.  Still complaining of right-sided pleuritic chest discomfort and back pain.  Also complaining of lower extremity weakness.  Overnight with positive urinary retention episode.  Objective: Vitals:   04/19/19 2306 04/19/19 2307 04/20/19 0526 04/20/19 0811  BP:   95/67   Pulse:   86   Resp:   15   Temp:   97.8 F (36.6 C)   TempSrc:   Oral   SpO2: 99% 99% 99% 98%  Weight:      Height:        Intake/Output Summary (Last 24 hours) at 04/20/2019 1629 Last data filed at 04/20/2019 1400 Gross per 24 hour  Intake 1667.59 ml  Output 1250 ml  Net 417.59 ml   Filed Weights   04/19/19 0957  Weight: 40.8 kg    Examination: General exam: Alert, awake, oriented x 3; afebrile, no nausea vomiting.  Still reporting some pleuritic discomfort specifically complaining of severe back pain.  Overnight experience acute episode of urinary retention and continues progressing lower extremity weakness (left more than right).  No upper extremities abnormality reported. Respiratory system: Able to speak in full sentences, no using accessory muscles; positive rhonchi right, positive expiratory wheezing. Cardiovascular system:RRR. No murmurs, rubs, gallops. Gastrointestinal system: Abdomen is nondistended, soft and nontender. No organomegaly or masses felt. Normal  bowel sounds heard. Central nervous system: Alert and oriented.  Muscle strength lower extremity 3-4/5 (left more than right); patient denies numbness. Extremities: No C/C/E, +pedal pulses Skin: No rashes, lesions or ulcers Psychiatry: Judgement and insight appear normal. Mood & affect appropriate.     Data Reviewed: I have personally reviewed following labs and imaging studies  CBC: Recent Labs  Lab 04/19/19 1114 04/20/19 0446  WBC 11.8* 10.2  NEUTROABS 9.8*  --   HGB 11.3* 10.3*  HCT 37.3* 33.2*  MCV 91.4 90.0  PLT 409* 694*   Basic Metabolic Panel: Recent Labs  Lab 04/19/19 1114 04/20/19 0446  NA 141 137  K 4.2 3.8  CL 99 103  CO2 27 23  GLUCOSE 122* 127*  BUN 11 13  CREATININE 0.66 0.59*  CALCIUM 8.9 8.9   GFR: Estimated Creatinine Clearance: 59.5 mL/min (A) (by C-G formula based on SCr of 0.59 mg/dL (L)).   Liver Function Tests: Recent Labs  Lab 04/19/19 1114  AST 28  ALT 36  ALKPHOS 137*  BILITOT 0.6  PROT 7.2  ALBUMIN 2.5*   Coagulation Profile: Recent Labs  Lab 04/20/19 0636  INR 1.0    Recent Results (from the past 240 hour(s))  SARS Coronavirus 2 (CEPHEID- Performed in [place] hospital lab), Hosp Order     Status: None   Collection Time: 04/19/19 11:42 AM   Specimen: Nasopharyngeal Swab  Result Value Ref Range Status   SARS Coronavirus 2 NEGATIVE NEGATIVE Final    Comment: (NOTE) If result is NEGATIVE SARS-CoV-2 target nucleic acids are NOT DETECTED. The SARS-CoV-2 RNA is generally detectable in upper and  lower  respiratory specimens during the acute phase of infection. The lowest  concentration of SARS-CoV-2 viral copies this assay can detect is 250  copies / mL. A negative result does not preclude SARS-CoV-2 infection  and should not be used as the sole basis for treatment or other  patient management decisions.  A negative result may occur with  improper specimen collection / handling, submission of specimen other  than  nasopharyngeal swab, presence of viral mutation(s) within the  areas targeted by this assay, and inadequate number of viral copies  (<250 copies / mL). A negative result must be combined with clinical  observations, patient history, and epidemiological information. If result is POSITIVE SARS-CoV-2 target nucleic acids are DETECTED. The SARS-CoV-2 RNA is generally detectable in upper and lower  respiratory specimens dur ing the acute phase of infection.  Positive  results are indicative of active infection with SARS-CoV-2.  Clinical  correlation with patient history and other diagnostic information is  necessary to determine patient infection status.  Positive results do  not rule out bacterial infection or co-infection with other viruses. If result is PRESUMPTIVE POSTIVE SARS-CoV-2 nucleic acids MAY BE PRESENT.   A presumptive positive result was obtained on the submitted specimen  and confirmed on repeat testing.  While 2019 novel coronavirus  (SARS-CoV-2) nucleic acids may be present in the submitted sample  additional confirmatory testing may be necessary for epidemiological  and / or clinical management purposes  to differentiate between  SARS-CoV-2 and other Sarbecovirus currently known to infect humans.  If clinically indicated additional testing with an alternate test  methodology [phone]) is advised. The SARS-CoV-2 RNA is generally  detectable in upper and lower respiratory sp ecimens during the acute  phase of infection. The expected result is Negative. Fact Sheet for Patients:  StrictlyIdeas.no Fact Sheet for Healthcare Providers: BankingDealers.co.za This test is not yet approved or cleared by the [place] FDA and has been authorized for detection and/or diagnosis of SARS-CoV-2 by FDA under an Emergency Use Authorization (EUA).  This EUA will remain in effect (meaning this test can be used) for the duration of  the COVID-19 declaration under Section 564(b)(1) of the Act, 21 U.S.C. section 360bbb-3(b)(1), unless the authorization is terminated or revoked sooner. Performed at [facility], [address], [place], [place] [address]      Radiology Studies: Ct Angio Chest Pe W And/or Wo Contrast  Result Date: 04/19/2019 CLINICAL DATA:  Chest pain EXAM: CT ANGIOGRAPHY CHEST WITH CONTRAST TECHNIQUE: Multidetector CT imaging of the chest was performed using the standard protocol during bolus administration of intravenous contrast. Multiplanar CT image reconstructions and MIPs were obtained to evaluate the vascular anatomy. CONTRAST:  153mL OMNIPAQUE IOHEXOL 350 MG/ML SOLN COMPARISON:  April 19, 2019 chest radiograph FINDINGS: Cardiovascular: There is no demonstrable pulmonary embolus. There is no thoracic aortic aneurysm or dissection. The visualized great vessels appear unremarkable. There is no pericardial effusion or pericardial thickening. Mediastinum/Nodes: Thyroid appears unremarkable. There is an aortopulmonary lymph node measuring 1.7 x 1.5 cm. There are subcentimeter right hilar lymph nodes. No esophageal lesion evident. Lungs/Pleura: There is underlying centrilobular and paraseptal emphysematous change. There are bullae in the upper lobes as well. There is a mass arising in the posterior segment of the right lower lobe which invades the right paratracheal region of the mediastinum as well as the posterior right pleura in the right upper hemithorax. There is extensive bony destruction involving portions of the right fourth and fifth ribs as well  as bony destruction and the T4 and T5 vertebral bodies. There is marked collapse of the T4 vertebral body with extensive tumor throughout this area due to the adjacent large mass. This mass measures 6.9 cm from right to left dimension, 7.2 cm from superior to inferior dimension, and 6.2 cm from anterior to posterior dimension. There is patchy atelectasis and probable  mild postobstructive pneumonitis in the posterior segment right upper lobe. No similar mass lesions are noted elsewhere. No evident pleural effusion. Upper Abdomen: There is hepatic steatosis. Visualized upper abdominal structures otherwise appear unremarkable. Musculoskeletal: Bony destruction involving portions of the T5 and T4 vertebral bodies with marked collapse of the T4 vertebral body. There is destruction of portions of the posterior right fourth and fifth ribs due to the large tumor in this area. There is destruction of portions of the posterior elements at T5 on the right. Other bony structures appear unremarkable. No chest wall lesions are evident. Review of the MIP images confirms the above findings. IMPRESSION: 1. No demonstrable pulmonary embolus. No thoracic aortic aneurysm or dissection. 2. 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 Marked collapse of the T4 vertebral body noted. Invasion of the posterior elements at T5 on the right noted. This mass measures 7.2 x 6.2 x 6.9 cm. 3.  Extensive underlying emphysematous change. 4.  Aortopulmonary window region adenopathy. 5.  Hepatic steatosis. Emphysema (ICD10-J43.9). Electronically Signed   By: Lowella Grip III M.D.   On: 04/19/2019 14:56   Mr Angio Head Wo Contrast  Result Date: 04/20/2019 CLINICAL DATA:  Infarcts.  Weakness.  Headache. EXAM: MRA HEAD WITHOUT CONTRAST TECHNIQUE: Angiographic images of the Circle of Willis were obtained using MRA technique without intravenous contrast. COMPARISON:  MRI brain reported separately. FINDINGS: The internal carotid arteries are widely patent. The basilar artery is widely patent with vertebrals codominant. The anterior cerebral arteries are widely patent, and  the anterior communicating artery connects the distal A1 segments. There is a 50-75% stenosis at the proximal A2 LEFT ACA. The proximal RIGHT and LEFT M1 MCA segments are widely patent. Both superior and inferior MCA divisions are patent. There is moderate irregularity of the M3 segments bilaterally, RIGHT greater than LEFT consistent with intracranial atherosclerotic disease. Unremarkable posterior cerebral arteries. No cerebellar branch occlusion. No saccular aneurysm. IMPRESSION: Mild intracranial atherosclerotic disease. No proximal flow-limiting stenosis, occlusion, or dissection. Electronically Signed   By: Staci Righter M.D.   On: 04/20/2019 13:45   Mr Jeri Cos UX Contrast  Result Date: 04/20/2019 CLINICAL DATA:  Lung cancer staging. EXAM: MRI HEAD WITHOUT AND WITH CONTRAST TECHNIQUE: Multiplanar, multiecho pulse sequences of the brain and surrounding structures were obtained without and with intravenous contrast. CONTRAST:  Gadavist 4 mL. COMPARISON:  MRA intracranial reported separately. Thoracic spine MRI reported separately. FINDINGS: Brain: Subcentimeter foci of restricted diffusion RIGHT frontal cortex and subcortical white matter, corresponding low ADC, no similar findings on the LEFT, nor in the posterior circulation consistent with acute RIGHT MCA territory infarcts. These are adjacent to areas of chronic infarction, with brain substance loss and encephalomalacia. Lobe is chronic appearing infarcts are seen elsewhere in the RIGHT hemisphere, both MCA, and PCA territory. Shower of emboli is suspected. No acute hemorrhage, mass lesion, hydrocephalus, or extra-axial fluid. Premature for age atrophy.  T2 and FLAIR hyperintensities in the white matter, predominantly affecting the pons/brainstem, query chronic microvascular ischemic change. Post infusion, no abnormal enhancement of the brain or meninges is detected. The small infarcts do not clearly enhance. There is no visible metastatic disease.  Vascular: Reported separately. Skull and upper cervical spine: Normal marrow signal. Sinuses/Orbits: No significant sinus disease.  Negative orbits. Other: No significant mastoid pathology.  Unremarkable nasopharynx. IMPRESSION: Small subcentimeter acute RIGHT frontal MCA territory infarcts, nonhemorrhagic. 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 Premature for age atrophy. White matter disease disproportionally affects the brainstem, raising the question of chronic microvascular ischemic change. No abnormal intracranial enhancement of the brain or meninges to suggest metastatic disease. Electronically Signed   By: Staci Righter M.D.   On: 04/20/2019 13:12   Mr Thoracic Spine W Wo Contrast  Result Date: 04/20/2019 CLINICAL DATA:  Abnormal CT chest. Chest pain. Shortness of breath. History of tobacco use. Emphysema. EXAM: MRI THORACIC WITHOUT AND WITH CONTRAST TECHNIQUE: Multiplanar and multiecho pulse sequences of the thoracic spine were obtained without and with intravenous contrast. CONTRAST:  Gadavist 4 mL. COMPARISON:  CT chest 04/19/2019 FINDINGS: MRI THORACIC SPINE FINDINGS Alignment: Kyphotic angulation centered at T4 due to partial collapse due to pathologic fracture. Vertebrae: Metastatic disease involving T3, T4, and T5 related to a large paravertebral mass. Pathologic fracture T4 with retropulsion of 2 mm estimated. Bowing of the posterior wall T5. Suspected metastasis at T12, LEFT hemi vertebra, 12 mm. 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 Epidural tumor extends from mid T3 through the T5-6 disc space. There is abnormal cord signal, maximal at T4, but extending cephalad as far as the T2-3 disc space. Paraspinal and other soft tissues: Large paravertebral mass on the RIGHT, correlating with the CT abnormality, measuring 69 x 79 x 75 mm.  Destruction of the T3, T4, and T5 vertebral bodies and posterior elements. Associated RIGHT-sided rib destruction and invasion of paravertebral musculature. Extension into the posterior mediastinum. Small RIGHT effusion. Disc levels: No compressive disc herniation or stenosis. IMPRESSION: 7 x 8 cm paravertebral mass centered at T4 on the RIGHT. Invasion of T3, T4, and T5. Pathologic T4 compression fracture. 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 Neurosurgical consultation is warranted. No compressive disc herniation. Suspected additional 12 mm LEFT T12 metastasis, incompletely evaluated. A call has been placed to the ordering provider. Electronically Signed   By: Staci Righter M.D.   On: 04/20/2019 13:26   US Carotid Bilateral  Result Date: 04/20/2019 CLINICAL DATA:  58 year old male with a history of bilateral upper extremity weakness EXAM: BILATERAL CAROTID DUPLEX ULTRASOUND TECHNIQUE: Pearline Cables scale imaging, color Doppler and duplex ultrasound were performed of bilateral carotid and vertebral arteries in the neck. COMPARISON:  None. FINDINGS: Criteria: Quantification of carotid stenosis is based on velocity parameters that correlate the residual internal carotid diameter with NASCET-based stenosis levels, using the diameter of the distal internal carotid lumen as the denominator for stenosis measurement. The following velocity measurements were obtained: RIGHT ICA:  Systolic 85 cm/sec, Diastolic 32 cm/sec CCA:  92 cm/sec SYSTOLIC ICA/CCA RATIO:  0.9 ECA:  79 cm/sec LEFT ICA:  Systolic 87 cm/sec, Diastolic 33 cm/sec CCA:  784 cm/sec SYSTOLIC ICA/CCA RATIO:  0.8 ECA:  85 cm/sec Right Brachial SBP: Not acquired Left Brachial SBP: Not acquired RIGHT CAROTID  ARTERY: No significant calcified disease of the right common carotid artery. Intermediate waveform maintained. Heterogeneous plaque without significant calcifications at the right carotid  bifurcation. Low resistance waveform of the right ICA. No significant tortuosity. RIGHT VERTEBRAL ARTERY: Antegrade flow with low resistance waveform. LEFT CAROTID ARTERY: No significant calcified disease of the left common carotid artery. Intermediate waveform maintained. Heterogeneous plaque at the left carotid bifurcation without significant calcifications. Low resistance waveform of the left ICA. LEFT VERTEBRAL ARTERY:  Antegrade flow with low resistance waveform. IMPRESSION: Color duplex indicates minimal heterogeneous plaque, with no hemodynamically significant stenosis by duplex criteria in the extracranial cerebrovascular circulation. Signed, Dulcy Fanny. Dellia Nims, RPVI [facility] Electronically Signed   By: Corrie Mckusick D.O.   On: 04/20/2019 12:37   Ct Biopsy  Result Date: 04/20/2019 INDICATION: Large right lung mass with involvement of the posterior chest wall. Tissue diagnosis is needed. EXAM: CT-GUIDED BIOPSY OF RIGHT LUNG MASS MEDICATIONS: None. ANESTHESIA/SEDATION: Moderate (conscious) sedation was employed during this procedure. A total of Versed 1.0 mg and Fentanyl 50 mcg was administered intravenously. Moderate Sedation Time: 16 minutes. The patient's level of consciousness and vital signs were monitored continuously by radiology nursing throughout the procedure under my direct supervision. FLUOROSCOPY TIME:  None COMPLICATIONS: None immediate. PROCEDURE: Informed written consent was obtained from the patient after a thorough discussion of the procedural risks, benefits and alternatives. All questions were addressed. Maximal Sterile Barrier Technique was utilized including caps, mask, sterile gowns, sterile gloves, sterile drape, hand hygiene and skin antiseptic. A timeout was performed prior to the initiation of the procedure. Patient was placed prone. CT images through the chest were obtained. The right side of the back was prepped  with chlorhexidine and sterile field was created. Skin and soft tissues anesthetized with 1% lidocaine. Using CT guidance, 17 gauge coaxial needle was directed into the lateral aspect of the mass. Needle position was confirmed within the lesion. A total of 3 core biopsies were obtained with an 18 gauge core device. Specimens placed in formalin. 17 gauge coaxial needle was removed without complication. Bandage placed over the puncture site. FINDINGS: Large mass in the posterior right chest with invasion of the posterior chest wall and ribs. Patient has severe emphysema. Needle position confirmed within the lesion. Negative for pneumothorax following the core biopsies. Three adequate core biopsies were obtained. IMPRESSION: CT-guided core biopsy of the right lung mass. Electronically Signed   By: Markus Daft M.D.   On: 04/20/2019 16:23   Dg Chest Portable 1 View  Result Date: 04/19/2019 CLINICAL DATA:  Shortness of breath, generalized weakness for 2 days, COPD, RIGHT-side pain, cough, smoker EXAM: PORTABLE CHEST 1 VIEW COMPARISON:  01/28/2014 FINDINGS: Normal heart size and pulmonary vascularity. Medial RIGHT upper lobe mass 6.8 x 4.8 cm highly worrisome for pulmonary neoplasm. Underlying emphysematous changes consistent with history of COPD. RIGHT apical scarring. No infiltrate, pleural effusion, or pneumothorax. Bones demineralized. IMPRESSION: COPD changes with RIGHT upper lobe scarring. Large medial RIGHT upper lobe mass measuring 6.8 x 4.8 cm highly worrisome for a primary pulmonary neoplasm; further evaluation by CT chest recommended, with contrast if patient's renal function permits. Findings called to Dr. Reather Converse on 04/19/2019 at 1200 hours. Electronically Signed   By: Lavonia Dana M.D.   On: 04/19/2019 12:02    Scheduled Meds:  budesonide (PULMICORT) nebulizer solution  0.5 mg Nebulization BID   dextromethorphan-guaiFENesin  1 tablet Oral BID   doxycycline  100 mg Oral Q12H   [  START ON 04/21/2019]  heparin injection (subcutaneous)  5,000 Units Subcutaneous Q8H   ipratropium-albuterol  3 mL Nebulization Q6H   mouth rinse  15 mL Mouth Rinse BID   methylPREDNISolone (SOLU-MEDROL) injection  60 mg Intravenous Q12H   nicotine  14 mg Transdermal Daily   pantoprazole  40 mg Oral Daily   sodium chloride flush  3 mL Intravenous Q12H   Continuous Infusions:  sodium chloride       LOS: 1 day    Time spent: 35 minutes. Greater than 50% of this time was spent in direct contact with the patient, coordinating care and discussing relevant ongoing clinical issues, including findings of medial stroke most likely associated with increased vascular coagulopathy from lung malignancy and tobacco abuse; complete work-up will be pursued checking carotid Dopplers, MRA and also 2D echo; will also check TSH, lipid panel and A1c.  Patient will be transferred to [facility] for debulking surgery and increase to 45 in the spine where he is experiencing severe spinal cord compression from metastatic lung cancer.  Case discussed in detail with Dr. Ellene Route neurosurgery and with Dr. Delton Coombes oncology.  Dr Aroor neurology was consulted for follow-up from a neurology standpoint given presentation of a new stroke.Barton Dubois, MD [facility] Pager [phone]   04/20/2019, 4:29 PM

## 2019-04-20 NOTE — ACP (Advance Care Planning) (Signed)
I left Advance Directives information/contact information in Mr Nurse' room. He was away for a test.

## 2019-04-20 NOTE — Progress Notes (Signed)
Patient c/o inability to urinate and consented to having I&O catheter completed.  400 ml amber urine noted.  P.J. Linus Mako, RN

## 2019-04-20 NOTE — Consult Note (Signed)
[facility] Consultation Oncology  Name: Paul Thornton      MRN: [id number]    Location: M578/I696-29  Date: 04/20/2019 Time:2:17 PM   REFERRING PHYSICIAN: Dr. Dyann Kief  REASON FOR CONSULT: Right lung mass   DIAGNOSIS: Presumed right lung cancer.  HISTORY OF PRESENT ILLNESS: Paul Thornton is a 58 year old very pleasant white male seen in consultation today for further work-up and management of right lung mass.  He came to the ER with shortness of breath, cough and pleuritic chest pain.  A CT scan of the chest PE protocol on 04/19/2019 showed large neoplasm arising from the posterior segment of the right upper lobe with invasion of the adjacent paratracheal region on the right as well as right posterior pleura and bony structures, most notably portions of the right fourth and fifth ribs posteriorly in T4 and T5 vertebral bodies.  The mass measures 7.2 x 6.2 x 6.9 cm.  AP window adenopathy was also noted.  He reported at least 20 pound weight loss in the last few months.  He smoked half pack of cigarettes per day at least 40 years.  He reported pain 8 out of 10 in intensity with no alleviating factors.  He lives at home with his mother and took care of her.  Family history is significant for father and paternal grandfather both had lung cancers.  They both smoked. He reported 2 falls in the last 3 days.  He also reported left leg weaker than the right leg.  He reports that his legs give out when he walks.  He could not urinate and a Foley catheter was placed in the ER.  PAST MEDICAL HISTORY:   Past Medical History:  Diagnosis Date  . Bronchitis   . COPD (chronic obstructive pulmonary disease) (HCC)     ALLERGIES: Allergies  Allergen Reactions  . Amoxicillin Hives    All "cillins"  . Penicillins Hives    Did it involve swelling of the face/tongue/throat, SOB, or low BP? No Did it involve sudden or severe rash/hives, skin peeling, or any reaction on the inside of your mouth or nose? No Did  you need to seek medical attention at a hospital or doctor's office? No When did it last happen? If all above answers are "NO", may proceed with cephalosporin use.      MEDICATIONS: I have reviewed the patient's current medications.     PAST SURGICAL HISTORY History reviewed. No pertinent surgical history.  FAMILY HISTORY: History reviewed. No pertinent family history.  SOCIAL HISTORY:  reports that he has been smoking. He has been smoking about 0.25 packs per day. He has never used smokeless tobacco. He reports that he does not drink alcohol or use drugs.  PERFORMANCE STATUS: The patient's performance status is 2 - Symptomatic, <50% confined to bed  PHYSICAL EXAM: Most Recent Vital Signs: Blood pressure 95/67, pulse 86, temperature 97.8 F (36.6 C), temperature source Oral, resp. rate 15, height 5\' 9"  (1.753 m), weight 90 lb (40.8 kg), SpO2 98 %. BP 95/67 (BP Location: Left Arm)   Pulse 86   Temp 97.8 F (36.6 C) (Oral)   Resp 15   Ht 5\' 9"  (1.753 m)   Wt 90 lb (40.8 kg)   SpO2 98%   BMI 13.29 kg/m  General appearance: alert and cooperative Head: Normocephalic, without obvious abnormality, atraumatic Neck: no adenopathy, supple, symmetrical, trachea midline and thyroid not enlarged, symmetric, no tenderness/mass/nodules Lungs: clear to auscultation bilaterally Heart: regular rate and rhythm Abdomen:  soft, non-tender; bowel sounds normal; no masses,  no organomegaly Extremities: extremities normal, atraumatic, no cyanosis or edema Skin: Skin color, texture, turgor normal. No rashes or lesions Lymph nodes: Cervical, supraclavicular, and axillary nodes normal. Neurologic: Motor: Strength decreased in the lower extremities, left more than right.  LABORATORY DATA:  Results for orders placed or performed during the hospital encounter of 04/19/19 (from the past 48 hour(s))  Brain natriuretic peptide     Status: None   Collection Time: 04/19/19 11:14 AM  Result Value Ref  Range   B Natriuretic Peptide 34.0 0.0 - 100.0 pg/mL    Comment: Performed at [facility], [address], [place], [place] [address]  CBC with Differential     Status: Abnormal   Collection Time: 04/19/19 11:14 AM  Result Value Ref Range   WBC 11.8 (H) 4.0 - 10.5 K/uL   RBC 4.08 (L) 4.22 - 5.81 MIL/uL   Hemoglobin 11.3 (L) 13.0 - 17.0 g/dL   HCT 37.3 (L) 39.0 - 52.0 %   MCV 91.4 80.0 - 100.0 fL   MCH 27.7 26.0 - 34.0 pg   MCHC 30.3 30.0 - 36.0 g/dL   RDW 14.3 11.5 - 15.5 %   Platelets 409 (H) 150 - 400 K/uL   nRBC 0.0 0.0 - 0.2 %   Neutrophils Relative % 82 %   Neutro Abs 9.8 (H) 1.7 - 7.7 K/uL   Lymphocytes Relative 13 %   Lymphs Abs 1.5 0.7 - 4.0 K/uL   Monocytes Relative 3 %   Monocytes Absolute 0.4 0.1 - 1.0 K/uL   Eosinophils Relative 0 %   Eosinophils Absolute 0.0 0.0 - 0.5 K/uL   Basophils Relative 1 %   Basophils Absolute 0.1 0.0 - 0.1 K/uL   Immature Granulocytes 1 %   Abs Immature Granulocytes 0.08 (H) 0.00 - 0.07 K/uL    Comment: Performed at [facility], [address], [place], [place] [address]  Comprehensive metabolic panel     Status: Abnormal   Collection Time: 04/19/19 11:14 AM  Result Value Ref Range   Sodium 141 135 - 145 mmol/L   Potassium 4.2 3.5 - 5.1 mmol/L   Chloride 99 98 - 111 mmol/L   CO2 27 22 - 32 mmol/L   Glucose, Bld 122 (H) 70 - 99 mg/dL   BUN 11 6 - 20 mg/dL   Creatinine, Ser 0.66 0.61 - 1.24 mg/dL   Calcium 8.9 8.9 - 10.3 mg/dL   Total Protein 7.2 6.5 - 8.1 g/dL   Albumin 2.5 (L) 3.5 - 5.0 g/dL   AST 28 15 - 41 U/L   ALT 36 0 - 44 U/L   Alkaline Phosphatase 137 (H) 38 - 126 U/L   Total Bilirubin 0.6 0.3 - 1.2 mg/dL   GFR calc non Af Amer >60 >60 mL/min   GFR calc Af Amer >60 >60 mL/min   Anion gap 15 5 - 15    Comment: Performed at [facility], [address], [place], [place] [address]  D-dimer, quantitative (not at [facility])     Status: Abnormal   Collection Time: 04/19/19 11:14 AM  Result Value Ref Range   D-Dimer, Quant 1.09 (H)  0.00 - 0.50 ug/mL-FEU    Comment: (NOTE) At the manufacturer cut-off of 0.50 ug/mL FEU, this assay has been documented to exclude PE with a sensitivity and negative predictive value of 97 to 99%.  At this time, this assay has not been approved by the FDA to exclude DVT/VTE.  Results should be correlated with clinical presentation. Performed at [facility], [address], [place], [place] [address]   SARS Coronavirus 2 ([place]- Performed in [facility] hospital lab), Hosp Order     Status: None   Collection Time: 04/19/19 11:42 AM   Specimen: Nasopharyngeal Swab  Result Value Ref Range   SARS Coronavirus 2 NEGATIVE NEGATIVE    Comment: (NOTE) If result is NEGATIVE SARS-CoV-2 target nucleic acids are NOT DETECTED. The SARS-CoV-2 RNA is generally detectable in upper and lower  respiratory specimens during the acute phase of infection. The lowest  concentration of SARS-CoV-2 viral copies this assay can detect is 250  copies / mL. A negative result does not preclude SARS-CoV-2 infection  and should not be used as the sole basis for treatment or other  patient management decisions.  A negative result may occur with  improper specimen collection / handling, submission of specimen other  than nasopharyngeal swab, presence of viral mutation(s) within the  areas targeted by this assay, and inadequate number of viral copies  (<250 copies / mL). A negative result must be combined with clinical  observations, patient history, and epidemiological information. If result is POSITIVE SARS-CoV-2 target nucleic acids are DETECTED. The SARS-CoV-2 RNA is generally detectable in upper and lower  respiratory specimens dur ing the acute phase of infection.  Positive  results are indicative of active infection with SARS-CoV-2.  Clinical  correlation with patient history and other diagnostic information is  necessary to determine patient infection status.  Positive results do  not rule out bacterial  infection or co-infection with other viruses. If result is PRESUMPTIVE POSTIVE SARS-CoV-2 nucleic acids MAY BE PRESENT.   A presumptive positive result was obtained on the submitted specimen  and confirmed on repeat testing.  While 2019 novel coronavirus  (SARS-CoV-2) nucleic acids may be present in the submitted sample  additional confirmatory testing may be necessary for epidemiological  and / or clinical management purposes  to differentiate between  SARS-CoV-2 and other Sarbecovirus currently known to infect humans.  If clinically indicated additional testing with an alternate test  methodology [phone]) is advised. The SARS-CoV-2 RNA is generally  detectable in upper and lower respiratory sp ecimens during the acute  phase of infection. The expected result is Negative. Fact Sheet for Patients:  StrictlyIdeas.no Fact Sheet for Healthcare Providers: BankingDealers.co.za This test is not yet approved or cleared by the [place] FDA and has been authorized for detection and/or diagnosis of SARS-CoV-2 by FDA under an Emergency Use Authorization (EUA).  This EUA will remain in effect (meaning this test can be used) for the duration of the COVID-19 declaration under Section 564(b)(1) of the Act, 21 U.S.C. section 360bbb-3(b)(1), unless the authorization is terminated or revoked sooner. Performed at [facility], [address] [place], [place] [address]   CBC     Status: Abnormal   Collection Time: 04/20/19  4:46 AM  Result Value Ref Range   WBC 10.2 4.0 - 10.5 K/uL   RBC 3.69 (L) 4.22 - 5.81 MIL/uL   Hemoglobin 10.3 (L) 13.0 - 17.0 g/dL   HCT 33.2 (L) 39.0 - 52.0 %   MCV 90.0 80.0 - 100.0 fL   MCH 27.9 26.0 - 34.0 pg   MCHC 31.0 30.0 - 36.0 g/dL   RDW 14.3 11.5 - 15.5 %   Platelets 406 (H) 150 - 400 K/uL   nRBC 0.0 0.0 - 0.2 %    Comment: Performed at [facility], [address], [place], [place] [address]  Basic metabolic  panel     Status: Abnormal   Collection Time: 04/20/19  4:46 AM  Result Value Ref Range   Sodium 137 135 - 145 mmol/L   Potassium 3.8 3.5 - 5.1 mmol/L   Chloride 103 98 - 111 mmol/L   CO2 23 22 - 32 mmol/L   Glucose, Bld 127 (H) 70 - 99 mg/dL   BUN 13 6 - 20 mg/dL   Creatinine, Ser 0.59 (L) 0.61 - 1.24 mg/dL   Calcium 8.9 8.9 - 10.3 mg/dL   GFR calc non Af Amer >60 >60 mL/min   GFR calc Af Amer >60 >60 mL/min   Anion gap 11 5 - 15    Comment: Performed at [facility], [address], [place], [place] [address]  Protime-INR     Status: None   Collection Time: 04/20/19  6:36 AM  Result Value Ref Range   Prothrombin Time 13.3 11.4 - 15.2 seconds   INR 1.0 0.8 - 1.2    Comment: (NOTE) INR goal varies based on device and disease states. Performed at [facility], [address], [place], [place] [address]       RADIOGRAPHY: Ct Angio Chest Pe W And/or Wo Contrast  Result Date: 04/19/2019 CLINICAL DATA:  Chest pain EXAM: CT ANGIOGRAPHY CHEST WITH CONTRAST TECHNIQUE: Multidetector CT imaging of the chest was performed using the standard protocol during bolus administration of intravenous contrast. Multiplanar CT image reconstructions and MIPs were obtained to evaluate the vascular anatomy. CONTRAST:  145mL OMNIPAQUE IOHEXOL 350 MG/ML SOLN COMPARISON:  April 19, 2019 chest radiograph FINDINGS: Cardiovascular: There is no demonstrable pulmonary embolus. There is no thoracic aortic aneurysm or dissection. The visualized great vessels appear unremarkable. There is no pericardial effusion or pericardial thickening. Mediastinum/Nodes: Thyroid appears unremarkable. There is an aortopulmonary lymph node measuring 1.7 x 1.5 cm. There are subcentimeter right hilar lymph nodes. No esophageal lesion evident. Lungs/Pleura: There is underlying centrilobular and paraseptal emphysematous change. There are bullae in the upper lobes as well. There is a mass arising in the posterior segment of the right lower lobe  which invades the right paratracheal region of the mediastinum as well as the posterior right pleura in the right upper hemithorax. There is extensive bony destruction involving portions of the right fourth and fifth ribs as well as bony destruction and the T4 and T5 vertebral bodies. There is marked collapse of the T4 vertebral body with extensive tumor throughout this area due to the adjacent large mass. This mass measures 6.9 cm from right to left dimension, 7.2 cm from superior to inferior dimension, and 6.2 cm from anterior to posterior dimension. There is patchy atelectasis and probable mild postobstructive pneumonitis in the posterior segment right upper lobe. No similar mass lesions are noted elsewhere. No evident pleural effusion. Upper Abdomen: There is hepatic steatosis. Visualized upper abdominal structures otherwise appear unremarkable. Musculoskeletal: Bony destruction involving portions of the T5 and T4 vertebral bodies with marked collapse of the T4 vertebral body. There is destruction of portions of the posterior right fourth and fifth ribs due to the large tumor in this area. There is destruction of portions of the posterior elements at T5 on the right. Other bony structures appear unremarkable. No chest wall lesions are evident. Review of the MIP images confirms the above findings. IMPRESSION: 1. No demonstrable pulmonary embolus. No thoracic aortic aneurysm or dissection. 2. Large neoplasm arising from the posterior segment right upper lobe with invasion  of the adjacent paratracheal region on the right as well as right posterior pleura and bony structures, most notably portions of the right fourth and fifth ribs posteriorly and T4 and T5 vertebral bodies. Marked collapse of the T4 vertebral body noted. Invasion of the posterior elements at T5 on the right noted. This mass measures 7.2 x 6.2 x 6.9 cm. 3.  Extensive underlying emphysematous change. 4.  Aortopulmonary window region adenopathy. 5.   Hepatic steatosis. Emphysema (ICD10-J43.9). Electronically Signed   By: Lowella Grip III M.D.   On: 04/19/2019 14:56   Mr Angio Head Wo Contrast  Result Date: 04/20/2019 CLINICAL DATA:  Infarcts.  Weakness.  Headache. EXAM: MRA HEAD WITHOUT CONTRAST TECHNIQUE: Angiographic images of the Circle of Willis were obtained using MRA technique without intravenous contrast. COMPARISON:  MRI brain reported separately. FINDINGS: The internal carotid arteries are widely patent. The basilar artery is widely patent with vertebrals codominant. The anterior cerebral arteries are widely patent, and the anterior communicating artery connects the distal A1 segments. There is a 50-75% stenosis at the proximal A2 LEFT ACA. The proximal RIGHT and LEFT M1 MCA segments are widely patent. Both superior and inferior MCA divisions are patent. There is moderate irregularity of the M3 segments bilaterally, RIGHT greater than LEFT consistent with intracranial atherosclerotic disease. Unremarkable posterior cerebral arteries. No cerebellar branch occlusion. No saccular aneurysm. IMPRESSION: Mild intracranial atherosclerotic disease. No proximal flow-limiting stenosis, occlusion, or dissection. Electronically Signed   By: Staci Righter M.D.   On: 04/20/2019 13:45   Mr Jeri Cos KG Contrast  Result Date: 04/20/2019 CLINICAL DATA:  Lung cancer staging. EXAM: MRI HEAD WITHOUT AND WITH CONTRAST TECHNIQUE: Multiplanar, multiecho pulse sequences of the brain and surrounding structures were obtained without and with intravenous contrast. CONTRAST:  Gadavist 4 mL. COMPARISON:  MRA intracranial reported separately. Thoracic spine MRI reported separately. FINDINGS: Brain: Subcentimeter foci of restricted diffusion RIGHT frontal cortex and subcortical white matter, corresponding low ADC, no similar findings on the LEFT, nor in the posterior circulation consistent with acute RIGHT MCA territory infarcts. These are adjacent to areas of chronic  infarction, with brain substance loss and encephalomalacia. Lobe is chronic appearing infarcts are seen elsewhere in the RIGHT hemisphere, both MCA, and PCA territory. Shower of emboli is suspected. No acute hemorrhage, mass lesion, hydrocephalus, or extra-axial fluid. Premature for age atrophy. T2 and FLAIR hyperintensities in the white matter, predominantly affecting the pons/brainstem, query chronic microvascular ischemic change. Post infusion, no abnormal enhancement of the brain or meninges is detected. The small infarcts do not clearly enhance. There is no visible metastatic disease. Vascular: Reported separately. Skull and upper cervical spine: Normal marrow signal. Sinuses/Orbits: No significant sinus disease.  Negative orbits. Other: No significant mastoid pathology.  Unremarkable nasopharynx. IMPRESSION: Small subcentimeter acute RIGHT frontal MCA territory infarcts, nonhemorrhagic. 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 Premature for age atrophy. White matter disease disproportionally affects the brainstem, raising the question of chronic microvascular ischemic change. No abnormal intracranial enhancement of the brain or meninges to suggest metastatic disease. Electronically Signed   By: Staci Righter M.D.   On: 04/20/2019 13:12   Mr Thoracic Spine W Wo Contrast  Result Date: 04/20/2019 CLINICAL DATA:  Abnormal CT chest. Chest pain. Shortness of breath. History of tobacco use. Emphysema. EXAM: MRI THORACIC WITHOUT AND WITH CONTRAST TECHNIQUE: Multiplanar and multiecho pulse sequences of the thoracic spine were obtained without and with intravenous contrast. CONTRAST:  Gadavist 4 mL. COMPARISON:  CT chest 04/19/2019 FINDINGS: MRI THORACIC SPINE FINDINGS Alignment: Kyphotic angulation centered at T4 due to partial collapse due to pathologic fracture. Vertebrae: Metastatic disease involving T3, T4, and T5 related to a  large paravertebral mass. Pathologic fracture T4 with retropulsion of 2 mm estimated. Bowing of the posterior wall T5. Suspected metastasis at T12, LEFT hemi vertebra, 12 mm. 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 Epidural tumor extends from mid T3 through the T5-6 disc space. There is abnormal cord signal, maximal at T4, but extending cephalad as far as the T2-3 disc space. Paraspinal and other soft tissues: Large paravertebral mass on the RIGHT, correlating with the CT abnormality, measuring 69 x 79 x 75 mm. Destruction of the T3, T4, and T5 vertebral bodies and posterior elements. Associated RIGHT-sided rib destruction and invasion of paravertebral musculature. Extension into the posterior mediastinum. Small RIGHT effusion. Disc levels: No compressive disc herniation or stenosis. IMPRESSION: 7 x 8 cm paravertebral mass centered at T4 on the RIGHT. Invasion of T3, T4, and T5. Pathologic T4 compression fracture. 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 Neurosurgical consultation is warranted. No compressive disc herniation. Suspected additional 12 mm LEFT T12 metastasis, incompletely evaluated. A call has been placed to the ordering provider. Electronically Signed   By: Staci Righter M.D.   On: 04/20/2019 13:26   US Carotid Bilateral  Result Date: 04/20/2019 CLINICAL DATA:  58 year old male with a history of bilateral upper extremity weakness EXAM: BILATERAL CAROTID DUPLEX ULTRASOUND TECHNIQUE: Pearline Cables scale imaging, color Doppler and duplex ultrasound were performed of bilateral carotid and vertebral arteries in the neck. COMPARISON:  None. FINDINGS: Criteria: Quantification of carotid stenosis is based on velocity parameters that correlate the residual internal carotid diameter with NASCET-based stenosis levels, using the diameter of the distal internal carotid lumen as  the denominator for stenosis measurement. The following velocity measurements were obtained: RIGHT ICA:  Systolic 85 cm/sec, Diastolic 32 cm/sec CCA:  92 cm/sec SYSTOLIC ICA/CCA RATIO:  0.9 ECA:  79 cm/sec LEFT ICA:  Systolic 87 cm/sec, Diastolic 33 cm/sec CCA:  782 cm/sec SYSTOLIC ICA/CCA RATIO:  0.8 ECA:  85 cm/sec Right Brachial SBP: Not acquired Left Brachial SBP: Not acquired RIGHT CAROTID ARTERY: No significant calcified disease of the right common carotid artery. Intermediate waveform maintained. Heterogeneous plaque without significant calcifications at the right carotid bifurcation. Low resistance waveform of the right ICA. No significant tortuosity. RIGHT VERTEBRAL ARTERY: Antegrade flow with low resistance waveform. LEFT CAROTID ARTERY: No significant calcified disease of the left common carotid artery. Intermediate waveform maintained. Heterogeneous plaque at the left carotid bifurcation without significant calcifications. Low resistance waveform of the left ICA. LEFT VERTEBRAL ARTERY:  Antegrade flow with low resistance waveform. IMPRESSION: Color duplex indicates minimal heterogeneous plaque, with no hemodynamically significant stenosis by duplex criteria in the extracranial cerebrovascular circulation. Signed, Dulcy Fanny. Dellia Nims, RPVI [facility] Electronically Signed   By: Corrie Mckusick D.O.   On: 04/20/2019 12:37   Dg Chest Portable 1 View  Result Date: 04/19/2019 CLINICAL DATA:  Shortness of breath, generalized weakness for 2 days, COPD, RIGHT-side pain, cough, smoker EXAM: PORTABLE CHEST 1 VIEW COMPARISON:  01/28/2014 FINDINGS: Normal heart size and pulmonary vascularity. Medial RIGHT upper lobe mass 6.8 x 4.8 cm highly worrisome for pulmonary neoplasm. Underlying emphysematous changes consistent with history of COPD.  RIGHT apical scarring. No infiltrate, pleural effusion, or pneumothorax. Bones demineralized. IMPRESSION: COPD changes  with RIGHT upper lobe scarring. Large medial RIGHT upper lobe mass measuring 6.8 x 4.8 cm highly worrisome for a primary pulmonary neoplasm; further evaluation by CT chest recommended, with contrast if patient's renal function permits. Findings called to Dr. Reather Converse on 04/19/2019 at 1200 hours. Electronically Signed   By: Lavonia Dana M.D.   On: 04/19/2019 12:02        ASSESSMENT and PLAN:  1.  Right lung mass: - Patient smoked about half pack per day for 40-45 years. - Presentation with shortness of breath and right chest wall pain. -CT PE protocol on 04/19/2019 shows 7.2 x 6.2 x 6.9 cm right posterior lung mass in the upper lobe.  Bone destruction involving portions of the T5 and T4 vertebral bodies with marked collapse of the T4 vertebral body.  There is destruction of the portions of the posterior right fourth and fifth ribs due to large tumor.  There is destruction of the portions of the posterior elements of T5 on the right. - Recommend CT-guided biopsy of the lung mass by IR. -Recommend MRI of the brain with and without contrast for staging.  2.  Lower extremity weakness: -He reported couple of falls at home in the last 3 to 4 days due to legs giving away. - I have recommended MRI of the thoracic spine with and without contrast to rule out extradural mass. - Scan reviewed by me showed large paravertebral mass on the right measuring 69 x 79 x 75 mm.  Severe cord compression due to epidural tumor board from the right as well as ventrally maximal at T4.  Epidural tumor extends from mid T3 through T5-6 disc space. -Would recommend neurosurgical evaluation as soon as possible.  All questions were answered. The patient knows to call the clinic with any problems, questions or concerns. We can certainly see the patient much sooner if necessary.    Derek Jack

## 2019-04-20 NOTE — Progress Notes (Signed)
Report given to OR. Pt due for bladder scan, OR aware. Foley will be placed in OR.  Pt alert and oriented x 4, VS stable, no signs of distress. Pt transported off the floor.

## 2019-04-20 NOTE — Evaluation (Signed)
Physical Therapy Evaluation Patient Details Name: Paul Thornton MRN: [id number] DOB: 1961-04-16 Today's Date: 04/20/2019   History of Present Illness  Paul Thornton is a 58 y.o. male with a past medical history significant for tobacco abuse, COPD and gastroesophageal reflux disease; presented to the emergency department secondary to increased shortness of breath, cough and weight loss.  Patient reports weight loss despite adequate oral intake in the last 3-63-month.  Reported shortness of breath, increased wheezing and coughing spells for the last 4 to 5 days, worsening.  No hemoptysis.  Patient reports right-sided chest discomfort, worse with movement and coughing, radiated to his back, 8 out of 10 in intensity, no alleviating factors.  Reports pain has been getting worse in the last 48 hours prior to admission.  Patient denies fever, chills, nausea, vomiting, dysuria, melena, hematochezia, sick contacts or any other complaints. In the ED was found to be tachypneic, borderline hypoxic, with positive expiratory wheezing.  Oxygen supplementation and albuterol treatment provided with some improvement in his symptoms.  D-dimer was elevated, mild elevation of WBCs and bronchitic changes appreciated on chest x-ray.  There was positive findings of a right lung mass further stratified with a CT chest that demonstrated what appears to be metastatic lung cancer involving pleura, ribs and T4-T5 vertebral bodies.  TRH has been consulted to admit patient for further evaluation and management.    Clinical Impression  Patient presents with LLE weakness, unable to actively dorsiflex left ankle due to weakness, demonstrates poor standing balance during sit to stands without AD, unsteady on feet with difficulty taking steps with LLE during gait training using RW, poor return for attempting left heel to toe stepping resulting frequent foot slapping and scissoring of LLE and had most difficulty making turns.  Patient put  back to bed after therapy to await procedure scheduled at [facility].  Patient will benefit from continued physical therapy in hospital and recommended venue below to increase strength, balance, endurance for safe ADLs and gait.    Follow Up Recommendations SNF;Supervision for mobility/OOB;Supervision/Assistance - 24 hour    Equipment Recommendations  Wheelchair (measurements PT);Wheelchair cushion (measurements PT)    Recommendations for Other Services       Precautions / Restrictions Precautions Precautions: Fall Precaution Comments: 2 falls in past week Restrictions Weight Bearing Restrictions: No      Mobility  Bed Mobility Overal bed mobility: Modified Independent                Transfers Overall transfer level: Needs assistance Equipment used: Rolling walker (2 wheeled);None Transfers: Sit to/from [employer] to Stand: Min assist Stand pivot transfers: Min assist;Mod assist       General transfer comment: very unsteady with leaning to the left due to LLE weakness  Ambulation/Gait Ambulation/Gait assistance: Mod assist Gait Distance (Feet): 25 Feet Assistive device: Rolling walker (2 wheeled) Gait Pattern/deviations: Decreased step length - right;Decreased step length - left;Decreased stance time - left;Decreased stride length;Scissoring;Steppage;Decreased dorsiflexion - left Gait velocity: decreased   General Gait Details: very unsteady labored slow cadence with incoordination of LLE due to weakness, poor return for left heel to toe stepping, frequent near loss of balance and limited due to c/o fatigue  Stairs            Wheelchair Mobility    Modified Rankin (Stroke Patients Only)       Balance Overall balance assessment: Needs assistance Sitting-balance support: Feet supported;No upper extremity supported Sitting balance-Leahy Scale: Fair Sitting balance -  Comments: fair/good   Standing balance support: During functional  activity;No upper extremity supported Standing balance-Leahy Scale: Poor Standing balance comment: fair using RW for static standing, fair/poor using RW for dynamic activity                             Pertinent Vitals/Pain Pain Assessment: 0-10 Pain Score: 8  Pain Location: upper back between the shoulders, right side chest wall Pain Descriptors / Indicators: Aching;Discomfort Pain Intervention(s): Limited activity within patient's tolerance;Monitored during session    Home Living Family/patient expects to be discharged to:: Private residence Living Arrangements: Parent Available Help at Discharge: Family Type of Home: House Home Access: Level entry     Home Layout: One level Home Equipment: None Additional Comments: 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    Prior Function Level of Independence: Independent         Comments: Pt independent in ADLs, drives, community ambulator     Hand Dominance   Dominant Hand: Right    Extremity/Trunk Assessment   Upper Extremity Assessment Upper Extremity Assessment: Defer to OT evaluation    Lower Extremity Assessment Lower Extremity Assessment: Generalized weakness;RLE deficits/detail;LLE deficits/detail RLE Deficits / Details: grossly 4/5, except hip flexors -4/5 LLE Deficits / Details: grossly 3+/5 except ankle dorsiflexors grossly -2/5 LLE Sensation: decreased proprioception LLE Coordination: decreased gross motor    Cervical / Trunk Assessment Cervical / Trunk Assessment: Normal  Communication   Communication: No difficulties  Cognition Arousal/Alertness: Awake/alert Behavior During Therapy: WFL for tasks assessed/performed Overall Cognitive Status: Within Functional Limits for tasks assessed                                        General Comments      Exercises     Assessment/Plan    PT Assessment Patient needs continued PT services  PT Problem List Decreased strength;Decreased activity  tolerance;Decreased balance;Decreased mobility;Decreased coordination       PT Treatment Interventions Gait training;Stair training;Functional mobility training;Therapeutic activities;Therapeutic exercise;Patient/family education;Balance training    PT Goals (Current goals can be found in the Care Plan section)  Acute Rehab PT Goals Patient Stated Goal: return home PT Goal Formulation: With patient Time For Goal Achievement: 05/04/19 Potential to Achieve Goals: Fair    Frequency Min 3X/week   Barriers to discharge        Co-evaluation               AM-PAC PT "6 Clicks" Mobility  Outcome Measure Help needed turning from your back to your side while in a flat bed without using bedrails?: None Help needed moving from lying on your back to sitting on the side of a flat bed without using bedrails?: None Help needed moving to and from a bed to a chair (including a wheelchair)?: A Lot Help needed standing up from a chair using your arms (e.g., wheelchair or bedside chair)?: A Lot Help needed to walk in hospital room?: A Lot Help needed climbing 3-5 steps with a railing? : Total 6 Click Score: 15    End of Session Equipment Utilized During Treatment: Gait belt Activity Tolerance: Patient tolerated treatment well Patient left: in bed;with call bell/phone within reach Nurse Communication: Mobility status PT Visit Diagnosis: Unsteadiness on feet (R26.81);Other abnormalities of gait and mobility (R26.89);Muscle weakness (generalized) (M62.81)    Time: 9702-6378 PT Time Calculation (min) (  ACUTE ONLY): 24 min   Charges:   PT Evaluation $PT Eval Moderate Complexity: 1 Mod PT Treatments $Therapeutic Activity: 23-37 mins        11:06 AM, 04/20/19 Lonell Grandchild, MPT Physical Therapist with [facility] [phone] office [phone] mobile phone

## 2019-04-20 NOTE — Anesthesia Preprocedure Evaluation (Addendum)
Anesthesia Evaluation  Patient identified by MRN, date of birth, ID band Patient awake  General Assessment Comment:Dr. Elsner consulted Dr. Constance Holster, ENT, to evaluate comment on CT Chest and paratracheal compression noted. Dr. Constance Holster states feel should not have any difficulty with intubation but will require ICU care and ventilation. Dr. Ellene Route will consult Critical Care to manage post op ventilation and care.  Reviewed: Allergy & Precautions, NPO status , Patient's Chart, lab work & pertinent test results  Airway Mallampati: II  TM Distance: >3 FB     Dental  (+) Dental Advisory Given, Poor Dentition   Pulmonary shortness of breath and at rest, COPD, Current Smoker,    - rhonchi + wheezing      Cardiovascular negative cardio ROS   Rhythm:Regular Rate:Normal     Neuro/Psych    GI/Hepatic Neg liver ROS, GERD  ,  Endo/Other  negative endocrine ROS  Renal/GU negative Renal ROS     Musculoskeletal   Abdominal   Peds  Hematology   Anesthesia Other Findings   Reproductive/Obstetrics                           Anesthesia Physical Anesthesia Plan  ASA: IV and emergent  Anesthesia Plan: General   Post-op Pain Management:    Induction: Intravenous  PONV Risk Score and Plan: 1 and Treatment may vary due to age or medical condition, Midazolam and Ondansetron  Airway Management Planned: Oral ETT and Video Laryngoscope Planned  Additional Equipment: Arterial line  Intra-op Plan:   Post-operative Plan: Post-operative intubation/ventilation  Informed Consent: I have reviewed the patients History and Physical, chart, labs and discussed the procedure including the risks, benefits and alternatives for the proposed anesthesia with the patient or authorized representative who has indicated his/her understanding and acceptance.     Dental advisory given  Plan Discussed with: CRNA, Anesthesiologist and  Surgeon  Anesthesia Plan Comments:        Anesthesia Quick Evaluation

## 2019-04-20 NOTE — Plan of Care (Signed)

## 2019-04-20 NOTE — Consult Note (Signed)
Reason for Consult: Evaluate airway Referring Physician: Barton Dubois, MD  Paul Thornton is an 58 y.o. male.  HPI: Right lung mass with erosion into the thoracic spine.  Here for urgent surgical intervention by neurosurgery, request by anesthesia to evaluate the airway to make sure there will be no airway complications during induction/intubation and postoperatively.  Patient has had an extensive smoking history and COPD and ongoing shortness of breath.  He denies difficulty swallowing.  Past Medical History:  Diagnosis Date  . Bronchitis   . COPD (chronic obstructive pulmonary disease) ([facility])     History reviewed. No pertinent surgical history.  History reviewed. No pertinent family history.  Social History:  reports that he has been smoking. He has been smoking about 0.25 packs per day. He has never used smokeless tobacco. He reports that he does not drink alcohol or use drugs.  Allergies:  Allergies  Allergen Reactions  . Amoxicillin Hives    All "cillins"  . Penicillins Hives    Did it involve swelling of the face/tongue/throat, SOB, or low BP? No Did it involve sudden or severe rash/hives, skin peeling, or any reaction on the inside of your mouth or nose? No Did you need to seek medical attention at a hospital or doctor's office? No When did it last happen? If all above answers are "NO", may proceed with cephalosporin use.    Medications: Reviewed  Results for orders placed or performed during the hospital encounter of 04/19/19 (from the past 48 hour(s))  Brain natriuretic peptide     Status: None   Collection Time: 04/19/19 11:14 AM  Result Value Ref Range   B Natriuretic Peptide 34.0 0.0 - 100.0 pg/mL    Comment: Performed at [facility], [address], [place], [place] [address]  CBC with Differential     Status: Abnormal   Collection Time: 04/19/19 11:14 AM  Result Value Ref Range   WBC 11.8 (H) 4.0 - 10.5 K/uL   RBC 4.08 (L) 4.22 - 5.81 MIL/uL    Hemoglobin 11.3 (L) 13.0 - 17.0 g/dL   HCT 37.3 (L) 39.0 - 52.0 %   MCV 91.4 80.0 - 100.0 fL   MCH 27.7 26.0 - 34.0 pg   MCHC 30.3 30.0 - 36.0 g/dL   RDW 14.3 11.5 - 15.5 %   Platelets 409 (H) 150 - 400 K/uL   nRBC 0.0 0.0 - 0.2 %   Neutrophils Relative % 82 %   Neutro Abs 9.8 (H) 1.7 - 7.7 K/uL   Lymphocytes Relative 13 %   Lymphs Abs 1.5 0.7 - 4.0 K/uL   Monocytes Relative 3 %   Monocytes Absolute 0.4 0.1 - 1.0 K/uL   Eosinophils Relative 0 %   Eosinophils Absolute 0.0 0.0 - 0.5 K/uL   Basophils Relative 1 %   Basophils Absolute 0.1 0.0 - 0.1 K/uL   Immature Granulocytes 1 %   Abs Immature Granulocytes 0.08 (H) 0.00 - 0.07 K/uL    Comment: Performed at [facility], [address] [place], [place] [address]  Comprehensive metabolic panel     Status: Abnormal   Collection Time: 04/19/19 11:14 AM  Result Value Ref Range   Sodium 141 135 - 145 mmol/L   Potassium 4.2 3.5 - 5.1 mmol/L   Chloride 99 98 - 111 mmol/L   CO2 27 22 - 32 mmol/L   Glucose, Bld 122 (H) 70 - 99 mg/dL   BUN 11 6 - 20 mg/dL   Creatinine, Ser 0.66  0.61 - 1.24 mg/dL   Calcium 8.9 8.9 - 10.3 mg/dL   Total Protein 7.2 6.5 - 8.1 g/dL   Albumin 2.5 (L) 3.5 - 5.0 g/dL   AST 28 15 - 41 U/L   ALT 36 0 - 44 U/L   Alkaline Phosphatase 137 (H) 38 - 126 U/L   Total Bilirubin 0.6 0.3 - 1.2 mg/dL   GFR calc non Af Amer >60 >60 mL/min   GFR calc Af Amer >60 >60 mL/min   Anion gap 15 5 - 15    Comment: Performed at [facility], [address], [place], [place] [address]  D-dimer, quantitative (not at [facility])     Status: Abnormal   Collection Time: 04/19/19 11:14 AM  Result Value Ref Range   D-Dimer, Quant 1.09 (H) 0.00 - 0.50 ug/mL-FEU    Comment: (NOTE) At the manufacturer cut-off of 0.50 ug/mL FEU, this assay has been documented to exclude PE with a sensitivity and negative predictive value of 97 to 99%.  At this time, this assay has not been approved by the FDA to exclude DVT/VTE. Results should be correlated  with clinical presentation. Performed at [facility], [address] [place], [place] [address]   SARS Coronavirus 2 ([place]- Performed in [facility] hospital lab), Hosp Order     Status: None   Collection Time: 04/19/19 11:42 AM   Specimen: Nasopharyngeal Swab  Result Value Ref Range   SARS Coronavirus 2 NEGATIVE NEGATIVE    Comment: (NOTE) If result is NEGATIVE SARS-CoV-2 target nucleic acids are NOT DETECTED. The SARS-CoV-2 RNA is generally detectable in upper and lower  respiratory specimens during the acute phase of infection. The lowest  concentration of SARS-CoV-2 viral copies this assay can detect is 250  copies / mL. A negative result does not preclude SARS-CoV-2 infection  and should not be used as the sole basis for treatment or other  patient management decisions.  A negative result may occur with  improper specimen collection / handling, submission of specimen other  than nasopharyngeal swab, presence of viral mutation(s) within the  areas targeted by this assay, and inadequate number of viral copies  (<250 copies / mL). A negative result must be combined with clinical  observations, patient history, and epidemiological information. If result is POSITIVE SARS-CoV-2 target nucleic acids are DETECTED. The SARS-CoV-2 RNA is generally detectable in upper and lower  respiratory specimens dur ing the acute phase of infection.  Positive  results are indicative of active infection with SARS-CoV-2.  Clinical  correlation with patient history and other diagnostic information is  necessary to determine patient infection status.  Positive results do  not rule out bacterial infection or co-infection with other viruses. If result is PRESUMPTIVE POSTIVE SARS-CoV-2 nucleic acids MAY BE PRESENT.   A presumptive positive result was obtained on the submitted specimen  and confirmed on repeat testing.  While 2019 novel coronavirus  (SARS-CoV-2) nucleic acids may be present in the  submitted sample  additional confirmatory testing may be necessary for epidemiological  and / or clinical management purposes  to differentiate between  SARS-CoV-2 and other Sarbecovirus currently known to infect humans.  If clinically indicated additional testing with an alternate test  methodology [phone]) is advised. The SARS-CoV-2 RNA is generally  detectable in upper and lower respiratory sp ecimens during the acute  phase of infection. The expected result is Negative. Fact Sheet for Patients:  StrictlyIdeas.no Fact Sheet for Healthcare Providers: BankingDealers.co.za This test is not yet approved  or cleared by the [place] and has been authorized for detection and/or diagnosis of SARS-CoV-2 by FDA under an Emergency Use Authorization (EUA).  This EUA will remain in effect (meaning this test can be used) for the duration of the COVID-19 declaration under Section 564(b)(1) of the Act, 21 U.S.C. section 360bbb-3(b)(1), unless the authorization is terminated or revoked sooner. Performed at [facility], [address] [place], [place] [address]   CBC     Status: Abnormal   Collection Time: 04/20/19  4:46 AM  Result Value Ref Range   WBC 10.2 4.0 - 10.5 K/uL   RBC 3.69 (L) 4.22 - 5.81 MIL/uL   Hemoglobin 10.3 (L) 13.0 - 17.0 g/dL   HCT 33.2 (L) 39.0 - 52.0 %   MCV 90.0 80.0 - 100.0 fL   MCH 27.9 26.0 - 34.0 pg   MCHC 31.0 30.0 - 36.0 g/dL   RDW 14.3 11.5 - 15.5 %   Platelets 406 (H) 150 - 400 K/uL   nRBC 0.0 0.0 - 0.2 %    Comment: Performed at [facility], [address] [place], [place] [address]  Basic metabolic panel     Status: Abnormal   Collection Time: 04/20/19  4:46 AM  Result Value Ref Range   Sodium 137 135 - 145 mmol/L   Potassium 3.8 3.5 - 5.1 mmol/L   Chloride 103 98 - 111 mmol/L   CO2 23 22 - 32 mmol/L   Glucose, Bld 127 (H) 70 - 99 mg/dL   BUN 13 6 - 20 mg/dL   Creatinine, Ser 0.59 (L) 0.61 - 1.24  mg/dL   Calcium 8.9 8.9 - 10.3 mg/dL   GFR calc non Af Amer >60 >60 mL/min   GFR calc Af Amer >60 >60 mL/min   Anion gap 11 5 - 15    Comment: Performed at [facility], [address], [place], [place] [address]  TSH     Status: None   Collection Time: 04/20/19  4:46 AM  Result Value Ref Range   TSH 0.876 0.350 - 4.500 uIU/mL    Comment: Performed by a 3rd Generation assay with a functional sensitivity of <=0.01 uIU/mL. Performed at [facility], [address], [place], [place] [address]   Hemoglobin A1c     Status: Abnormal   Collection Time: 04/20/19  4:46 AM  Result Value Ref Range   Hgb A1c MFr Bld 6.0 (H) 4.8 - 5.6 %    Comment: (NOTE) Pre diabetes:          5.7%-6.4% Diabetes:              >6.4% Glycemic control for   <7.0% adults with diabetes    Mean Plasma Glucose 125.5 mg/dL    Comment: Performed at [facility] [address] [place], [place] [address]  Protime-INR     Status: None   Collection Time: 04/20/19  6:36 AM  Result Value Ref Range   Prothrombin Time 13.3 11.4 - 15.2 seconds   INR 1.0 0.8 - 1.2    Comment: (NOTE) INR goal varies based on device and disease states. Performed at [facility], [address] [place], [place] [address]     Ct Angio Chest Pe W And/or Wo Contrast  Result Date: 04/19/2019 CLINICAL DATA:  Chest pain EXAM: CT ANGIOGRAPHY CHEST WITH CONTRAST TECHNIQUE: Multidetector CT imaging of the chest was performed using the standard protocol during bolus administration of intravenous contrast. Multiplanar CT image reconstructions and  MIPs were obtained to evaluate the vascular anatomy. CONTRAST:  135mL OMNIPAQUE IOHEXOL 350 MG/ML SOLN COMPARISON:  April 19, 2019 chest radiograph FINDINGS: Cardiovascular: There is no demonstrable pulmonary embolus. There is no thoracic aortic aneurysm or dissection. The visualized great vessels appear unremarkable. There is no pericardial effusion or pericardial thickening. Mediastinum/Nodes: Thyroid  appears unremarkable. There is an aortopulmonary lymph node measuring 1.7 x 1.5 cm. There are subcentimeter right hilar lymph nodes. No esophageal lesion evident. Lungs/Pleura: There is underlying centrilobular and paraseptal emphysematous change. There are bullae in the upper lobes as well. There is a mass arising in the posterior segment of the right lower lobe which invades the right paratracheal region of the mediastinum as well as the posterior right pleura in the right upper hemithorax. There is extensive bony destruction involving portions of the right fourth and fifth ribs as well as bony destruction and the T4 and T5 vertebral bodies. There is marked collapse of the T4 vertebral body with extensive tumor throughout this area due to the adjacent large mass. This mass measures 6.9 cm from right to left dimension, 7.2 cm from superior to inferior dimension, and 6.2 cm from anterior to posterior dimension. There is patchy atelectasis and probable mild postobstructive pneumonitis in the posterior segment right upper lobe. No similar mass lesions are noted elsewhere. No evident pleural effusion. Upper Abdomen: There is hepatic steatosis. Visualized upper abdominal structures otherwise appear unremarkable. Musculoskeletal: Bony destruction involving portions of the T5 and T4 vertebral bodies with marked collapse of the T4 vertebral body. There is destruction of portions of the posterior right fourth and fifth ribs due to the large tumor in this area. There is destruction of portions of the posterior elements at T5 on the right. Other bony structures appear unremarkable. No chest wall lesions are evident. Review of the MIP images confirms the above findings. IMPRESSION: 1. No demonstrable pulmonary embolus. No thoracic aortic aneurysm or dissection. 2. 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 Marked collapse of the T4 vertebral body noted. Invasion of the posterior elements at T5 on the right noted. This mass measures 7.2 x 6.2 x 6.9 cm. 3.  Extensive underlying emphysematous change. 4.  Aortopulmonary window region adenopathy. 5.  Hepatic steatosis. Emphysema (ICD10-J43.9). Electronically Signed   By: Lowella Grip III M.D.   On: 04/19/2019 14:56   Mr Angio Head Wo Contrast  Result Date: 04/20/2019 CLINICAL DATA:  Infarcts.  Weakness.  Headache. EXAM: MRA HEAD WITHOUT CONTRAST TECHNIQUE: Angiographic images of the Circle of Willis were obtained using MRA technique without intravenous contrast. COMPARISON:  MRI brain reported separately. FINDINGS: The internal carotid arteries are widely patent. The basilar artery is widely patent with vertebrals codominant. The anterior cerebral arteries are widely patent, and the anterior communicating artery connects the distal A1 segments. There is a 50-75% stenosis at the proximal A2 LEFT ACA. The proximal RIGHT and LEFT M1 MCA segments are widely patent. Both superior and inferior MCA divisions are patent. There is moderate irregularity of the M3 segments bilaterally, RIGHT greater than LEFT consistent with intracranial atherosclerotic disease. Unremarkable posterior cerebral arteries. No cerebellar branch occlusion. No saccular aneurysm. IMPRESSION: Mild intracranial atherosclerotic disease. No proximal flow-limiting stenosis, occlusion, or dissection. Electronically Signed   By: Staci Righter  M.D.   On: 04/20/2019 13:45   Mr Jeri Cos JA Contrast  Result Date: 04/20/2019 CLINICAL DATA:  Lung cancer staging. EXAM: MRI HEAD WITHOUT AND WITH CONTRAST TECHNIQUE: Multiplanar, multiecho pulse sequences of the brain and surrounding structures were obtained without and with intravenous contrast. CONTRAST:  Gadavist 4 mL. COMPARISON:  MRA  intracranial reported separately. Thoracic spine MRI reported separately. FINDINGS: Brain: Subcentimeter foci of restricted diffusion RIGHT frontal cortex and subcortical white matter, corresponding low ADC, no similar findings on the LEFT, nor in the posterior circulation consistent with acute RIGHT MCA territory infarcts. These are adjacent to areas of chronic infarction, with brain substance loss and encephalomalacia. Lobe is chronic appearing infarcts are seen elsewhere in the RIGHT hemisphere, both MCA, and PCA territory. Shower of emboli is suspected. No acute hemorrhage, mass lesion, hydrocephalus, or extra-axial fluid. Premature for age atrophy. T2 and FLAIR hyperintensities in the white matter, predominantly affecting the pons/brainstem, query chronic microvascular ischemic change. Post infusion, no abnormal enhancement of the brain or meninges is detected. The small infarcts do not clearly enhance. There is no visible metastatic disease. Vascular: Reported separately. Skull and upper cervical spine: Normal marrow signal. Sinuses/Orbits: No significant sinus disease.  Negative orbits. Other: No significant mastoid pathology.  Unremarkable nasopharynx. IMPRESSION: Small subcentimeter acute RIGHT frontal MCA territory infarcts, nonhemorrhagic. 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 Premature for age atrophy. White matter disease disproportionally affects the brainstem, raising the question of chronic microvascular ischemic change. No abnormal intracranial enhancement of the brain or meninges to suggest metastatic disease. Electronically Signed   By: Staci Righter M.D.   On: 04/20/2019 13:12   Mr Thoracic Spine W Wo Contrast  Result Date: 04/20/2019 CLINICAL DATA:  Abnormal CT chest. Chest pain. Shortness of breath. History of tobacco use. Emphysema. EXAM: MRI THORACIC WITHOUT AND WITH CONTRAST TECHNIQUE: Multiplanar  and multiecho pulse sequences of the thoracic spine were obtained without and with intravenous contrast. CONTRAST:  Gadavist 4 mL. COMPARISON:  CT chest 04/19/2019 FINDINGS: MRI THORACIC SPINE FINDINGS Alignment: Kyphotic angulation centered at T4 due to partial collapse due to pathologic fracture. Vertebrae: Metastatic disease involving T3, T4, and T5 related to a large paravertebral mass. Pathologic fracture T4 with retropulsion of 2 mm estimated. Bowing of the posterior wall T5. Suspected metastasis at T12, LEFT hemi vertebra, 12 mm. 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 Epidural tumor extends from mid T3 through the T5-6 disc space. There is abnormal cord signal, maximal at T4, but extending cephalad as far as the T2-3 disc space. Paraspinal and other soft tissues: Large paravertebral mass on the RIGHT, correlating with the CT abnormality, measuring 69 x 79 x 75 mm. Destruction of the T3, T4, and T5 vertebral bodies and posterior elements. Associated RIGHT-sided rib destruction and invasion of paravertebral musculature. Extension into the posterior mediastinum. Small RIGHT effusion. Disc levels: No compressive disc herniation or stenosis. IMPRESSION: 7 x 8 cm paravertebral mass centered at T4 on the RIGHT. Invasion of T3, T4, and T5. Pathologic T4 compression fracture. 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 Neurosurgical consultation is warranted. No compressive disc herniation. Suspected additional 12 mm LEFT T12 metastasis, incompletely evaluated. A call has been placed to the ordering provider. Electronically Signed   By: Staci Righter M.D.   On: 04/20/2019 13:26  US Carotid Bilateral  Result Date: 04/20/2019 CLINICAL DATA:  59 year old male with a history of bilateral upper extremity weakness EXAM: BILATERAL CAROTID DUPLEX ULTRASOUND TECHNIQUE: Pearline Cables scale  imaging, color Doppler and duplex ultrasound were performed of bilateral carotid and vertebral arteries in the neck. COMPARISON:  None. FINDINGS: Criteria: Quantification of carotid stenosis is based on velocity parameters that correlate the residual internal carotid diameter with NASCET-based stenosis levels, using the diameter of the distal internal carotid lumen as the denominator for stenosis measurement. The following velocity measurements were obtained: RIGHT ICA:  Systolic 85 cm/sec, Diastolic 32 cm/sec CCA:  92 cm/sec SYSTOLIC ICA/CCA RATIO:  0.9 ECA:  79 cm/sec LEFT ICA:  Systolic 87 cm/sec, Diastolic 33 cm/sec CCA:  258 cm/sec SYSTOLIC ICA/CCA RATIO:  0.8 ECA:  85 cm/sec Right Brachial SBP: Not acquired Left Brachial SBP: Not acquired RIGHT CAROTID ARTERY: No significant calcified disease of the right common carotid artery. Intermediate waveform maintained. Heterogeneous plaque without significant calcifications at the right carotid bifurcation. Low resistance waveform of the right ICA. No significant tortuosity. RIGHT VERTEBRAL ARTERY: Antegrade flow with low resistance waveform. LEFT CAROTID ARTERY: No significant calcified disease of the left common carotid artery. Intermediate waveform maintained. Heterogeneous plaque at the left carotid bifurcation without significant calcifications. Low resistance waveform of the left ICA. LEFT VERTEBRAL ARTERY:  Antegrade flow with low resistance waveform. IMPRESSION: Color duplex indicates minimal heterogeneous plaque, with no hemodynamically significant stenosis by duplex criteria in the extracranial cerebrovascular circulation. Signed, Dulcy Fanny. Dellia Nims, RPVI [facility] Radiology Electronically Signed   By: Corrie Mckusick D.O.   On: 04/20/2019 12:37   Ct Biopsy  Result Date: 04/20/2019 INDICATION: Large right lung mass with involvement of the posterior chest wall. Tissue diagnosis is needed. EXAM: CT-GUIDED  BIOPSY OF RIGHT LUNG MASS MEDICATIONS: None. ANESTHESIA/SEDATION: Moderate (conscious) sedation was employed during this procedure. A total of Versed 1.0 mg and Fentanyl 50 mcg was administered intravenously. Moderate Sedation Time: 16 minutes. The patient's level of consciousness and vital signs were monitored continuously by radiology nursing throughout the procedure under my direct supervision. FLUOROSCOPY TIME:  None COMPLICATIONS: None immediate. PROCEDURE: Informed written consent was obtained from the patient after a thorough discussion of the procedural risks, benefits and alternatives. All questions were addressed. Maximal Sterile Barrier Technique was utilized including caps, mask, sterile gowns, sterile gloves, sterile drape, hand hygiene and skin antiseptic. A timeout was performed prior to the initiation of the procedure. Patient was placed prone. CT images through the chest were obtained. The right side of the back was prepped with chlorhexidine and sterile field was created. Skin and soft tissues anesthetized with 1% lidocaine. Using CT guidance, 17 gauge coaxial needle was directed into the lateral aspect of the mass. Needle position was confirmed within the lesion. A total of 3 core biopsies were obtained with an 18 gauge core device. Specimens placed in formalin. 17 gauge coaxial needle was removed without complication. Bandage placed over the puncture site. FINDINGS: Large mass in the posterior right chest with invasion of the posterior chest wall and ribs. Patient has severe emphysema. Needle position confirmed within the lesion. Negative for pneumothorax following the core biopsies. Three adequate core biopsies were obtained. IMPRESSION: CT-guided core biopsy of the right lung mass. Electronically Signed   By: Markus Daft M.D.   On: 04/20/2019 16:23   Dg Chest Port 1 View  Result Date: 04/20/2019 CLINICAL DATA:  58 year old male with a history of chest wall mass.  EXAM: PORTABLE CHEST 1 VIEW  COMPARISON:  04/19/2019, CT 04/20/2019, 04/19/2011 FINDINGS: Cardiomediastinal silhouette unchanged. Opacity/density at the upper right mediastinum is unchanged from the comparison, compatible with malignancy identified on prior CT. No pneumothorax. No confluent airspace disease. No pleural effusion. Destructive changes of the posterior right ribs/vertebral bodies better characterized on prior CT. IMPRESSION: No complicating features status post CT-guided chest wall mass biopsy. Electronically Signed   By: Corrie Mckusick D.O.   On: 04/20/2019 16:59   Dg Chest Portable 1 View  Result Date: 04/19/2019 CLINICAL DATA:  Shortness of breath, generalized weakness for 2 days, COPD, RIGHT-side pain, cough, smoker EXAM: PORTABLE CHEST 1 VIEW COMPARISON:  01/28/2014 FINDINGS: Normal heart size and pulmonary vascularity. Medial RIGHT upper lobe mass 6.8 x 4.8 cm highly worrisome for pulmonary neoplasm. Underlying emphysematous changes consistent with history of COPD. RIGHT apical scarring. No infiltrate, pleural effusion, or pneumothorax. Bones demineralized. IMPRESSION: COPD changes with RIGHT upper lobe scarring. Large medial RIGHT upper lobe mass measuring 6.8 x 4.8 cm highly worrisome for a primary pulmonary neoplasm; further evaluation by CT chest recommended, with contrast if patient's renal function permits. Findings called to Dr. Reather Converse on 04/19/2019 at 1200 hours. Electronically Signed   By: Lavonia Dana M.D.   On: 04/19/2019 12:02    FXO:VANVBTYO except as listed in admit H&P  Blood pressure 117/75, pulse 88, temperature 98.8 F (37.1 C), temperature source Oral, resp. rate 20, height 5\' 9"  (1.753 m), weight 42.5 kg, SpO2 95 %.  PHYSICAL EXAM: Overall appearance: Cachectic gentleman, in no distress, moving air very well, with some expiratory wheeze audible without a stethoscope.  There is no evidence of stridor or airway distress. Head:  Normocephalic, atraumatic. Ears: External ears look healthy. Nose:  External nose is healthy in appearance.  Oral Cavity/Pharynx:  There are no mucosal lesions or masses identified. Larynx/Hypopharynx: Deferred   Studies Reviewed: Chest angios CT reviewed.  There is no tracheal compression identified all the way down to the first level bronchi.  Airways are widely patent.  Procedures: none   Assessment/Plan: No clinical concern for airway compromise relating to the upper airway/trachea.  Anesthesia has requested that I remain on standby until he is safely anesthetized.  I do believe he is going to have difficulty extubating because of his severe lung disease but I do not think airway obstruction is going to be an issue.  I will be available during the induction of anesthesia.  I will be available thereafter as needed.  Izora Gala 04/20/2019, 9:11 PM

## 2019-04-20 NOTE — Evaluation (Addendum)
Occupational Therapy Evaluation Patient Details Name: Paul Thornton MRN: [id number] DOB: 1961-05-16 Today's Date: 04/20/2019    History of Present Illness Paul Thornton is a 58 y.o. male with a past medical history significant for tobacco abuse, COPD and gastroesophageal reflux disease; presented to the emergency department secondary to increased shortness of breath, cough and weight loss.  Patient reports weight loss despite adequate oral intake in the last 3-1-month.  Reported shortness of breath, increased wheezing and coughing spells for the last 4 to 5 days, worsening.  No hemoptysis.  Patient reports right-sided chest discomfort, worse with movement and coughing, radiated to his back, 8 out of 10 in intensity, no alleviating factors.  Reports pain has been getting worse in the last 48 hours prior to admission.  Patient denies fever, chills, nausea, vomiting, dysuria, melena, hematochezia, sick contacts or any other complaints. In the ED was found to be tachypneic, borderline hypoxic, with positive expiratory wheezing.  Oxygen supplementation and albuterol treatment provided with some improvement in his symptoms.  D-dimer was elevated, mild elevation of WBCs and bronchitic changes appreciated on chest x-ray.  There was positive findings of a right lung mass further stratified with a CT chest that demonstrated what appears to be metastatic lung cancer involving pleura, ribs and T4-T5 vertebral bodies.  TRH has been consulted to admit patient for further evaluation and management.   Clinical Impression   Pt very pleasant, agreeable to OT evaluation this am. Pt reporting pain in back upon sitting and standing, decreases with sitting back down or lying in bed. Pt with generalized weakness and is easily fatigued, limiting activity tolerance required for ADL completion. Pt is able to complete seated tasks without difficulty, pain and fatigue limit standing tasks. Pt also helps care for his mother who  lives with him. Recommend SNF on discharge to improve safety and independence in ADL completion. If pt should decline SNF, recommend HHOT evaluation to assess functioning in home environment.     Follow Up Recommendations  SNF   Equipment Recommendations  Tub/shower seat       Precautions / Restrictions Precautions Precautions: Fall Precaution Comments: 2 falls in past week Restrictions Weight Bearing Restrictions: No      Mobility Bed Mobility Overal bed mobility: Modified Independent                Transfers Overall transfer level: Needs assistance Equipment used: Rolling walker (2 wheeled) Transfers: Sit to/from Stand Sit to Stand: Supervision                  ADL either performed or assessed with clinical judgement   ADL Overall ADL's : Needs assistance/impaired     Grooming: Min guard;Standing           Upper Body Dressing : Modified independent;Sitting   Lower Body Dressing: Modified independent;Sitting/lateral leans   Toilet Transfer: Min Insurance claims handler Details (indicate cue type and reason): simulated Toileting- Clothing Manipulation and Hygiene: Modified independent;Sitting/lateral lean         General ADL Comments: Pt requiring seated rest breaks due to fatigue. Unable to maintain standing for sustained ADL completion (such as grooming at sink) due to fear of legs giving out and fatigue.      Vision Baseline Vision/History: Wears glasses Wears Glasses: At all times Patient Visual Report: No change from baseline Vision Assessment?: No apparent visual deficits     Perception     Praxis      Pertinent Vitals/Pain Pain Assessment:  0-10 Pain Score: 8  Pain Location: back (upon standing) Pain Descriptors / Indicators: Aching;Grimacing Pain Intervention(s): Limited activity within patient's tolerance;Monitored during session;Repositioned     Hand Dominance Right   Extremity/Trunk Assessment Upper Extremity  Assessment Upper Extremity Assessment: Generalized weakness(BUE grossly 4-/5)   Lower Extremity Assessment Lower Extremity Assessment: Defer to PT evaluation   Cervical / Trunk Assessment Cervical / Trunk Assessment: Normal   Communication Communication Communication: No difficulties   Cognition Arousal/Alertness: Awake/alert Behavior During Therapy: WFL for tasks assessed/performed Overall Cognitive Status: Within Functional Limits for tasks assessed                                                Home Living Family/patient expects to be discharged to:: Private residence Living Arrangements: Parent(mother)   Type of Home: House Home Access: Level entry     Home Layout: One level     Bathroom Shower/Tub: Teacher, early years/pre: Standard     Home Equipment: None   Additional Comments: 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      Prior Functioning/Environment Level of Independence: Independent        Comments: Pt independent in ADLs, drives        OT Problem List: Decreased activity tolerance;Decreased strength;Decreased knowledge of use of DME or AE;Decreased safety awareness          End of Session Equipment Utilized During Treatment: Gait belt;Rolling walker;Oxygen  Activity Tolerance: Patient tolerated treatment well Patient left: in bed;with call bell/phone within reach;with bed alarm set  OT Visit Diagnosis: Muscle weakness (generalized) (M62.81);Repeated falls (R29.6)                Time: 9539-6728 OT Time Calculation (min): 21 min Charges:  OT General Charges $OT Visit: 1 Visit OT Evaluation $OT Eval Low Complexity: [place], OTR/L  [phone] 04/20/2019, 8:10 AM

## 2019-04-20 NOTE — Procedures (Signed)
Interventional Radiology Procedure:   Indications: Right lung mass  Procedure: CT guided right lung mass  Findings:  3 cores from lung mass  Complications: None     EBL: less than 10 ml  Plan: CXR at 1630.      Ritvik Mczeal R. Anselm Pancoast, MD  Pager: [phone]

## 2019-04-21 ENCOUNTER — Ambulatory Visit (HOSPITAL_COMMUNITY): Admit: 2019-04-21 | Payer: Self-pay

## 2019-04-21 ENCOUNTER — Inpatient Hospital Stay (HOSPITAL_COMMUNITY): Payer: Medicaid Other

## 2019-04-21 ENCOUNTER — Encounter (HOSPITAL_COMMUNITY): Payer: Self-pay | Admitting: Neurological Surgery

## 2019-04-21 DIAGNOSIS — J9621 Acute and chronic respiratory failure with hypoxia: Secondary | ICD-10-CM

## 2019-04-21 DIAGNOSIS — I6340 Cerebral infarction due to embolism of unspecified cerebral artery: Secondary | ICD-10-CM

## 2019-04-21 DIAGNOSIS — J441 Chronic obstructive pulmonary disease with (acute) exacerbation: Secondary | ICD-10-CM

## 2019-04-21 DIAGNOSIS — J9622 Acute and chronic respiratory failure with hypercapnia: Secondary | ICD-10-CM

## 2019-04-21 DIAGNOSIS — I63411 Cerebral infarction due to embolism of right middle cerebral artery: Secondary | ICD-10-CM

## 2019-04-21 DIAGNOSIS — I639 Cerebral infarction, unspecified: Secondary | ICD-10-CM

## 2019-04-21 LAB — POCT I-STAT 7, (LYTES, BLD GAS, ICA,H+H)
Acid-Base Excess: 1 mmol/L (ref 0.0–2.0)
Acid-Base Excess: 2 mmol/L (ref 0.0–2.0)
Bicarbonate: 26.5 mmol/L (ref 20.0–28.0)
Bicarbonate: 25.1 mmol/L (ref 20.0–28.0)
Bicarbonate: 25.3 mmol/L (ref 20.0–28.0)
Calcium, Ion: 1.29 mmol/L (ref 1.15–1.40)
Calcium, Ion: 1.27 mmol/L (ref 1.15–1.40)
Calcium, Ion: 1.19 mmol/L (ref 1.15–1.40)
HCT: 26 % — ABNORMAL LOW (ref 39.0–52.0)
HCT: 32 % — ABNORMAL LOW (ref 39.0–52.0)
HCT: 34 % — ABNORMAL LOW (ref 39.0–52.0)
Hemoglobin: 8.8 g/dL — ABNORMAL LOW (ref 13.0–17.0)
Hemoglobin: 10.9 g/dL — ABNORMAL LOW (ref 13.0–17.0)
Hemoglobin: 11.6 g/dL — ABNORMAL LOW (ref 13.0–17.0)
O2 Saturation: 100 %
O2 Saturation: 100 %
O2 Saturation: 100 %
Patient temperature: 35.5
Patient temperature: 99.1
Patient temperature: 36.1
Potassium: 3.8 mmol/L (ref 3.5–5.1)
Potassium: 3.7 mmol/L (ref 3.5–5.1)
Potassium: 4.4 mmol/L (ref 3.5–5.1)
Sodium: 137 mmol/L (ref 135–145)
Sodium: 136 mmol/L (ref 135–145)
Sodium: 136 mmol/L (ref 135–145)
TCO2: 28 mmol/L (ref 22–32)
TCO2: 26 mmol/L (ref 22–32)
TCO2: 27 mmol/L (ref 22–32)
pCO2 arterial: 43 mmHg (ref 32.0–48.0)
pCO2 arterial: 34.6 mmHg (ref 32.0–48.0)
pCO2 arterial: 40.3 mmHg (ref 32.0–48.0)
pH, Arterial: 7.391 (ref 7.350–7.450)
pH, Arterial: 7.469 — ABNORMAL HIGH (ref 7.350–7.450)
pH, Arterial: 7.402 (ref 7.350–7.450)
pO2, Arterial: 208 mmHg — ABNORMAL HIGH (ref 83.0–108.0)
pO2, Arterial: 187 mmHg — ABNORMAL HIGH (ref 83.0–108.0)
pO2, Arterial: 282 mmHg — ABNORMAL HIGH (ref 83.0–108.0)

## 2019-04-21 LAB — BASIC METABOLIC PANEL
Anion gap: 10 (ref 5–15)
BUN: 15 mg/dL (ref 6–20)
CO2: 22 mmol/L (ref 22–32)
Calcium: 8.8 mg/dL — ABNORMAL LOW (ref 8.9–10.3)
Chloride: 104 mmol/L (ref 98–111)
Creatinine, Ser: 0.62 mg/dL (ref 0.61–1.24)
GFR calc Af Amer: >60 mL/min (ref >60)
GFR calc non Af Amer: >60 mL/min (ref >60)
Glucose, Bld: 125 mg/dL — ABNORMAL HIGH (ref 70–99)
Potassium: 3.9 mmol/L (ref 3.5–5.1)
Sodium: 136 mmol/L (ref 135–145)

## 2019-04-21 LAB — HIV ANTIBODY (ROUTINE TESTING W REFLEX): HIV Screen 4th Generation wRfx: NONREACTIVE

## 2019-04-21 LAB — CBC
HCT: 34 % — ABNORMAL LOW (ref 39.0–52.0)
Hemoglobin: 11.3 g/dL — ABNORMAL LOW (ref 13.0–17.0)
MCH: 29.2 pg (ref 26.0–34.0)
MCHC: 33.2 g/dL (ref 30.0–36.0)
MCV: 87.9 fL (ref 80.0–100.0)
Platelets: 276 10*3/uL (ref 150–400)
RBC: 3.87 MIL/uL — ABNORMAL LOW (ref 4.22–5.81)
RDW: 14 % (ref 11.5–15.5)
WBC: 13.9 10*3/uL — ABNORMAL HIGH (ref 4.0–10.5)
nRBC: 0 % (ref 0.0–0.2)

## 2019-04-21 LAB — LIPID PANEL
Cholesterol: 109 mg/dL (ref 0–200)
HDL: 26 mg/dL — ABNORMAL LOW (ref >40)
LDL Cholesterol: 63 mg/dL (ref 0–99)
Total CHOL/HDL Ratio: 4.2 RATIO
Triglycerides: 101 mg/dL (ref <150)
VLDL: 20 mg/dL (ref 0–40)

## 2019-04-21 LAB — ECHOCARDIOGRAM COMPLETE
Height: 69 in
Weight: 1650.8 oz

## 2019-04-21 MED ORDER — POLYETHYLENE GLYCOL 3350 17 G PO PACK
17.0000 g | PACK | Freq: Every day | ORAL | Status: DC | PRN
  Administered 2019-04-24: 17 g via ORAL
  Filled 2019-04-21 (×3): qty 1

## 2019-04-21 MED ORDER — FENTANYL BOLUS VIA INFUSION
50.0000 ug | INTRAVENOUS | Status: DC | PRN
  Filled 2019-04-21: qty 50

## 2019-04-21 MED ORDER — SODIUM CHLORIDE 0.9 % IV SOLN
100.0000 mg | Freq: Two times a day (BID) | INTRAVENOUS | Status: DC
  Administered 2019-04-21 – 2019-04-23 (×5): 100 mg via INTRAVENOUS
  Filled 2019-04-21 (×6): qty 100

## 2019-04-21 MED ORDER — ONDANSETRON HCL 4 MG PO TABS
4.0000 mg | ORAL_TABLET | Freq: Four times a day (QID) | ORAL | Status: DC | PRN
  Filled 2019-04-21: qty 1

## 2019-04-21 MED ORDER — PHENOL 1.4 % MT LIQD: 1.0000 | OROMUCOSAL | Status: DC | PRN

## 2019-04-21 MED ORDER — VANCOMYCIN HCL IN DEXTROSE 1-5 GM/200ML-% IV SOLN
1000.0000 mg | Freq: Once | INTRAVENOUS | Status: AC
  Administered 2019-04-21: 1000 mg via INTRAVENOUS
  Filled 2019-04-21: qty 200

## 2019-04-21 MED ORDER — PANTOPRAZOLE SODIUM 40 MG PO PACK: 40.0000 mg | PACK | Freq: Every day | ORAL | Status: DC

## 2019-04-21 MED ORDER — MORPHINE SULFATE (PF) 2 MG/ML IV SOLN: 2.0000 mg | INTRAVENOUS | Status: DC | PRN

## 2019-04-21 MED ORDER — ALBUTEROL SULFATE HFA 108 (90 BASE) MCG/ACT IN AERS: 1.0000 | INHALATION_SPRAY | Freq: Four times a day (QID) | RESPIRATORY_TRACT | Status: DC | PRN

## 2019-04-21 MED ORDER — LIDOCAINE 2% (20 MG/ML) 5 ML SYRINGE
INTRAMUSCULAR | Status: AC
  Filled 2019-04-21: qty 5

## 2019-04-21 MED ORDER — SENNA 8.6 MG PO TABS
1.0000 | ORAL_TABLET | Freq: Two times a day (BID) | ORAL | Status: DC
  Administered 2019-04-21 – 2019-04-25 (×8): 8.6 mg via ORAL
  Filled 2019-04-21 (×9): qty 1

## 2019-04-21 MED ORDER — PANTOPRAZOLE SODIUM 40 MG IV SOLR: 40.0000 mg | Freq: Every day | INTRAVENOUS | Status: DC

## 2019-04-21 MED ORDER — ARTIFICIAL TEARS OPHTHALMIC OINT
TOPICAL_OINTMENT | OPHTHALMIC | Status: AC
  Filled 2019-04-21: qty 3.5

## 2019-04-21 MED ORDER — FENTANYL CITRATE (PF) 100 MCG/2ML IJ SOLN
50.0000 ug | Freq: Once | INTRAMUSCULAR | Status: AC
  Administered 2019-04-21: 50 ug via INTRAVENOUS

## 2019-04-21 MED ORDER — LACTATED RINGERS IV SOLN
INTRAVENOUS | Status: DC
  Administered 2019-04-21 (×2): via INTRAVENOUS

## 2019-04-21 MED ORDER — ACETAMINOPHEN 325 MG PO TABS
650.0000 mg | ORAL_TABLET | ORAL | Status: DC | PRN
  Administered 2019-04-25: 16:00:00 650 mg via ORAL
  Filled 2019-04-21: qty 2

## 2019-04-21 MED ORDER — PHENYLEPHRINE HCL (PRESSORS) 10 MG/ML IV SOLN
INTRAVENOUS | Status: DC | PRN
  Administered 2019-04-21: 80 ug via INTRAVENOUS

## 2019-04-21 MED ORDER — DEXAMETHASONE 4 MG PO TABS
4.0000 mg | ORAL_TABLET | Freq: Four times a day (QID) | ORAL | Status: DC
  Administered 2019-04-21 – 2019-04-25 (×16): 4 mg via ORAL
  Filled 2019-04-21 (×17): qty 1

## 2019-04-21 MED ORDER — PANTOPRAZOLE SODIUM 40 MG PO TBEC
40.0000 mg | DELAYED_RELEASE_TABLET | Freq: Every day | ORAL | Status: DC
  Administered 2019-04-21 – 2019-04-26 (×6): 40 mg via ORAL
  Filled 2019-04-21 (×6): qty 1

## 2019-04-21 MED ORDER — ONDANSETRON HCL 4 MG/2ML IJ SOLN
4.0000 mg | Freq: Four times a day (QID) | INTRAMUSCULAR | Status: DC | PRN
  Administered 2019-04-26: 4 mg via INTRAVENOUS
  Filled 2019-04-21: qty 2

## 2019-04-21 MED ORDER — SUCCINYLCHOLINE CHLORIDE 200 MG/10ML IV SOSY
PREFILLED_SYRINGE | INTRAVENOUS | Status: AC
  Filled 2019-04-21: qty 10

## 2019-04-21 MED ORDER — DEXAMETHASONE SODIUM PHOSPHATE 4 MG/ML IJ SOLN
4.0000 mg | Freq: Four times a day (QID) | INTRAMUSCULAR | Status: DC
  Administered 2019-04-21 – 2019-04-22 (×3): 4 mg via INTRAVENOUS
  Filled 2019-04-21 (×4): qty 1

## 2019-04-21 MED ORDER — ALBUMIN HUMAN 5 % IV SOLN
INTRAVENOUS | Status: DC | PRN
  Administered 2019-04-21 (×2): via INTRAVENOUS

## 2019-04-21 MED ORDER — FENTANYL 2500MCG IN NS 250ML (10MCG/ML) PREMIX INFUSION
50.0000 ug/h | INTRAVENOUS | Status: DC
  Administered 2019-04-21: 03:00:00 50 ug/h via INTRAVENOUS
  Filled 2019-04-21: qty 250

## 2019-04-21 MED ORDER — ONDANSETRON HCL 4 MG/2ML IJ SOLN
INTRAMUSCULAR | Status: AC
  Filled 2019-04-21: qty 2

## 2019-04-21 MED ORDER — MENTHOL 3 MG MT LOZG: 1.0000 | LOZENGE | OROMUCOSAL | Status: DC | PRN

## 2019-04-21 MED ORDER — ORAL CARE MOUTH RINSE
15.0000 mL | OROMUCOSAL | Status: DC
  Administered 2019-04-21 (×2): 15 mL via OROMUCOSAL

## 2019-04-21 MED ORDER — MIDAZOLAM HCL 2 MG/2ML IJ SOLN: 2.0000 mg | INTRAMUSCULAR | Status: DC | PRN

## 2019-04-21 MED ORDER — PHENYLEPHRINE 40 MCG/ML (10ML) SYRINGE FOR IV PUSH (FOR BLOOD PRESSURE SUPPORT)
PREFILLED_SYRINGE | INTRAVENOUS | Status: AC
  Filled 2019-04-21: qty 10

## 2019-04-21 MED ORDER — SODIUM CHLORIDE 0.9% FLUSH: 3.0000 mL | INTRAVENOUS | Status: DC | PRN

## 2019-04-21 MED ORDER — ROCURONIUM BROMIDE 10 MG/ML (PF) SYRINGE
PREFILLED_SYRINGE | INTRAVENOUS | Status: AC
  Filled 2019-04-21: qty 30

## 2019-04-21 MED ORDER — SODIUM CHLORIDE (PF) 0.9 % IJ SOLN
INTRAMUSCULAR | Status: AC
  Filled 2019-04-21: qty 10

## 2019-04-21 MED ORDER — DOCUSATE SODIUM 100 MG PO CAPS
100.0000 mg | ORAL_CAPSULE | Freq: Two times a day (BID) | ORAL | Status: DC
  Administered 2019-04-21 – 2019-04-25 (×9): 100 mg via ORAL
  Filled 2019-04-21 (×10): qty 1

## 2019-04-21 MED ORDER — MORPHINE SULFATE (PF) 2 MG/ML IV SOLN
2.0000 mg | INTRAVENOUS | Status: DC | PRN
  Administered 2019-04-21 – 2019-04-26 (×7): 2 mg via INTRAVENOUS
  Filled 2019-04-21 (×7): qty 1

## 2019-04-21 MED ORDER — ASPIRIN 325 MG PO TABS
325.0000 mg | ORAL_TABLET | Freq: Every day | ORAL | Status: DC
  Administered 2019-04-21 – 2019-04-22 (×2): 325 mg via ORAL
  Filled 2019-04-21 (×2): qty 1

## 2019-04-21 MED ORDER — CHLORHEXIDINE GLUCONATE 0.12% ORAL RINSE (MEDLINE KIT): 15.0000 mL | Freq: Two times a day (BID) | OROMUCOSAL | Status: DC

## 2019-04-21 MED ORDER — ALUM & MAG HYDROXIDE-SIMETH 200-200-20 MG/5ML PO SUSP
30.0000 mL | Freq: Four times a day (QID) | ORAL | Status: DC | PRN
  Administered 2019-04-24 – 2019-04-25 (×2): 30 mL via ORAL
  Filled 2019-04-21 (×2): qty 30

## 2019-04-21 MED ORDER — SODIUM CHLORIDE 0.9 % IV SOLN: 250.0000 mL | INTRAVENOUS | Status: DC

## 2019-04-21 MED ORDER — FLEET ENEMA 7-19 GM/118ML RE ENEM: 1.0000 | ENEMA | Freq: Once | RECTAL | Status: DC | PRN

## 2019-04-21 MED ORDER — CHLORHEXIDINE GLUCONATE CLOTH 2 % EX PADS
6.0000 | MEDICATED_PAD | Freq: Every day | CUTANEOUS | Status: DC
  Administered 2019-04-21 – 2019-04-23 (×3): 6 via TOPICAL

## 2019-04-21 MED ORDER — PROPOFOL 1000 MG/100ML IV EMUL
INTRAVENOUS | Status: AC
  Filled 2019-04-21: qty 100

## 2019-04-21 MED ORDER — ACETAMINOPHEN 650 MG RE SUPP: 650.0000 mg | RECTAL | Status: DC | PRN

## 2019-04-21 MED ORDER — IPRATROPIUM-ALBUTEROL 0.5-2.5 (3) MG/3ML IN SOLN
3.0000 mL | Freq: Four times a day (QID) | RESPIRATORY_TRACT | Status: DC | PRN
  Administered 2019-04-21: 3 mL via RESPIRATORY_TRACT
  Filled 2019-04-21: qty 3

## 2019-04-21 MED ORDER — SODIUM CHLORIDE 0.9% FLUSH
3.0000 mL | Freq: Two times a day (BID) | INTRAVENOUS | Status: DC
  Administered 2019-04-21: 10 mL via INTRAVENOUS
  Administered 2019-04-21 – 2019-04-26 (×9): 3 mL via INTRAVENOUS

## 2019-04-21 MED ORDER — EPHEDRINE 5 MG/ML INJ
INTRAVENOUS | Status: AC
  Filled 2019-04-21: qty 10

## 2019-04-21 MED ORDER — THROMBIN 5000 UNITS EX SOLR
CUTANEOUS | Status: AC
  Filled 2019-04-21: qty 10000

## 2019-04-21 MED ORDER — BISACODYL 10 MG RE SUPP
10.0000 mg | Freq: Every day | RECTAL | Status: DC | PRN
  Administered 2019-04-24 – 2019-04-26 (×2): 10 mg via RECTAL
  Filled 2019-04-21 (×2): qty 1

## 2019-04-21 MED ORDER — HYDROCODONE-ACETAMINOPHEN 5-325 MG PO TABS
1.0000 | ORAL_TABLET | ORAL | Status: DC | PRN
  Administered 2019-04-21 – 2019-04-26 (×21): 2 via ORAL
  Administered 2019-04-26: 1 via ORAL
  Filled 2019-04-21 (×2): qty 2
  Filled 2019-04-21: qty 1
  Filled 2019-04-21 (×19): qty 2

## 2019-04-21 MED FILL — Thrombin For Soln 5000 Unit: CUTANEOUS | Qty: 5000 | Status: AC

## 2019-04-21 MED FILL — Gelatin Absorbable MT Powder: OROMUCOSAL | Qty: 1 | Status: AC

## 2019-04-21 NOTE — Anesthesia Postprocedure Evaluation (Signed)
Anesthesia Post Note  Patient: Paul Thornton  Procedure(s) Performed: POSTERIOR THORACIC FUSION 5 LEVEL (N/A Back)     Patient location during evaluation: ICU Anesthesia Type: General Level of consciousness: patient remains intubated per anesthesia plan Pain management: pain level controlled Vital Signs Assessment: post-procedure vital signs reviewed and stable Respiratory status: patient remains intubated per anesthesia plan Cardiovascular status: stable Postop Assessment: no apparent nausea or vomiting Anesthetic complications: no    Last Vitals:  Vitals:   04/20/19 1956 04/20/19 2035  BP:  117/75  Pulse:  88  Resp:  20  Temp:  37.1 C  SpO2: 95% 95%    Last Pain:  Vitals:   04/20/19 2035  TempSrc: Oral  PainSc:                  Alisan Dokes

## 2019-04-21 NOTE — Progress Notes (Signed)
[facility] Progress Note Patient Name: Paul Thornton DOB: 04-07-1961 MRN: [id number]   Date of Service  04/21/2019  HPI/Events of Note  Patient with a thoracic mass with metastatic involvement of the thoracic spine s/p spine surgery. Pt came up to the unit intubated postop with the plan for a controlled extubation when he is wide awake.  eICU Interventions  New patient evaluation completed.        Paul Thornton Paul Thornton 04/21/2019, 4:53 AM

## 2019-04-21 NOTE — Evaluation (Addendum)
Physical Therapy RE- Evaluation Patient Details Name: Paul Thornton MRN: [id number] DOB: [birth year] Today's Date: 04/21/2019   History of Present Illness  This 58 y.o. male admitted with progressive back and chest wall pain as well as LE weakness and falls.  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   He underwent resection of tumor, laminectomy and decompression of T3-5 with fusion from T2-6.  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  No cranial metastatic dissease seen.  PMH includes COPD   Clinical Impression  Pt was able to get up and move from bed to chair with two person hand held assist.  He continues to be weaker in his left leg, but can lift legs against gravity with mild sensory deficits.  He feels better up OOB and we weaned him to RA with sats in the mid 90s.  Back precaution education initiated.  Pt would benefit from post acute rehab before returning home with his mom.   PT to follow acutely for deficits listed below.      Follow Up Recommendations CIR    Equipment Recommendations  Rolling walker with 5" wheels;3in1 (PT)    Recommendations for Other Services Rehab consult     Precautions / Restrictions Precautions Precautions: Fall Precaution Comments: back precautions  Required Braces or Orthoses: Spinal Brace Spinal Brace: Thoracolumbosacral orthotic;Applied in sitting position      Mobility  Bed Mobility Overal bed mobility: Needs Assistance Bed Mobility: Rolling;Sidelying to Sit Rolling: Min assist;+2 for safety/equipment Sidelying to sit: Min assist;+2 for safety/equipment       General bed mobility comments: Min assist to roll to left, and come up to sitting EOB.  Support needed at trunk during transition, pt managing legs on his  own.   Transfers Overall transfer level: Needs assistance Equipment used: 2 person hand held assist Transfers: Sit to/from Stand Sit to Stand: +2 physical assistance;Mod assist Stand pivot transfers: +2 physical assistance;Mod assist       General transfer comment: Two person mod hand held assist to stand and pivot to the recliner chair from the bed, hyper extension moment at left knee during stance.       Modified Rankin (Stroke Patients Only) Modified Rankin (Stroke Patients Only) Pre-Morbid Rankin Score: No symptoms Modified Rankin: Moderately severe disability     Balance Overall balance assessment: Needs assistance Sitting-balance support: Feet supported;Bilateral upper extremity supported Sitting balance-Leahy Scale: Fair Sitting balance - Comments: supervision EOB, difficulty due to pain with weight shifting.    Standing balance support: Bilateral upper extremity supported Standing balance-Leahy Scale: Poor Standing balance comment: needs external support in standing.                              Pertinent Vitals/Pain Pain Assessment: 0-10 Pain Score: 5 (at rest, more 8/10 FACES with mobility) Faces Pain Scale: Hurts even more Pain Location: mid back shoulders Pain Descriptors / Indicators: Guarding;Grimacing Pain Intervention(s): Limited activity within patient's tolerance;Monitored during session;Repositioned    Home Living Family/patient expects to be discharged to:: Private residence Living Arrangements: Parent Available Help at Discharge: Family Type of Home: House Home Access: Level entry     Home Layout: One level Home Equipment: None Additional Comments: 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  Sisters are taking care of her now    Prior Function Level of Independence: Independent         Comments: Pt enjoys fishing.  Pt independent in ADLs, drives, community ambulator(takes mother to her appointments, shops for her)      Hand Dominance   Dominant Hand: Left    Extremity/Trunk Assessment   Upper Extremity Assessment Upper Extremity Assessment: Defer to OT evaluation    Lower Extremity Assessment Lower Extremity Assessment: RLE deficits/detail;LLE deficits/detail RLE Deficits / Details: left continues to be functionally weaker than right with gross in bed and EOB strength testing 3/5, pt with hyperextension moment on the left with WB and stepping.  RLE Sensation: decreased light touch LLE Deficits / Details: left continues to be functionally weaker than right with gross in bed and EOB strength testing 3/5, pt with hyperextension moment on the left with WB and stepping.  LLE Sensation: decreased light touch    Cervical / Trunk Assessment Cervical / Trunk Assessment: Other exceptions Cervical / Trunk Exceptions: post op thoracic decompression and fusion  Communication   Communication: No difficulties  Cognition Arousal/Alertness: Awake/alert Behavior During Therapy: WFL for tasks assessed/performed Overall Cognitive Status: Impaired/Different from baseline Area of Impairment: Memory                 Orientation Level: Disoriented to;Time Current Attention Level: Selective Memory: Decreased short-term memory     Awareness: Intellectual Problem Solving: Requires verbal cues General Comments: BIMs 8             Assessment/Plan    PT Assessment Patient needs continued PT services  PT Problem List Decreased strength;Decreased activity tolerance;Decreased balance;Decreased mobility;Decreased coordination;Decreased knowledge of use of DME;Decreased knowledge of precautions;Impaired sensation;Pain       PT Treatment Interventions DME instruction;Gait training;Stair training;Functional mobility training;Therapeutic activities;Therapeutic exercise;Balance training;Patient/family education;Modalities    PT Goals (Current goals can be found in the Care Plan section)  Acute Rehab PT  Goals Patient Stated Goal: to go fishing again PT Goal Formulation: With patient Time For Goal Achievement: 05/05/19 Potential to Achieve Goals: Good    Frequency Min 5X/week        Co-evaluation PT/OT/SLP Co-Evaluation/Treatment: Yes Reason for Co-Treatment: For patient/therapist safety;Complexity of the patient's impairments (multi-system involvement);To address functional/ADL transfers PT goals addressed during session: Mobility/safety with mobility;Balance;Strengthening/ROM OT goals addressed during session: Strengthening/ROM;ADL's and self-care       AM-PAC PT "6 Clicks" Mobility  Outcome Measure Help needed turning from your back to your side while in a flat bed without using bedrails?: A Little Help needed moving from lying on your back to sitting on the side of a flat bed without using bedrails?: A Little Help needed moving to and from a bed to a chair (including a wheelchair)?: A Lot Help needed standing up from a chair using your arms (e.g., wheelchair or bedside chair)?: A Lot Help needed to walk in hospital room?: A Lot Help needed climbing 3-5 steps with a railing? : Total 6 Click Score: 13    End of Session Equipment Utilized During Treatment: Gait belt;Back brace Activity Tolerance: Patient limited by pain Patient left: in chair;with call bell/phone within reach Nurse Communication: Mobility status PT Visit Diagnosis: Muscle weakness (generalized) (M62.81);Difficulty in walking, not elsewhere classified (R26.2);Pain;Other symptoms and signs involving the nervous system (R29.898) Pain - Right/Left: (central) Pain - part of body: (mid back)    Time: 8786-7672 PT Time Calculation (min) (ACUTE ONLY):  23 min   Charges:      Wells Guiles B. Na Waldrip, PT, DPT  Acute Rehabilitation [phone] pager [phone]) [phone] office  @ [facility]: [phone]   PT Evaluation $PT Re-evaluation: 1 Re-eval         04/21/2019, 3:02 PM

## 2019-04-21 NOTE — Progress Notes (Signed)
Nutrition Follow-up  DOCUMENTATION CODES:   Underweight  INTERVENTION:   Supplement diet once advanced    NUTRITION DIAGNOSIS:   Increased nutrient needs related to cancer and cancer related treatments as evidenced by estimated needs.  Ongoing   GOAL:   Patient will meet greater than or equal to 90% of their needs  Not met   MONITOR:   Diet advancement, Labs, Weight trends  REASON FOR ASSESSMENT:   Consult Assessment of nutrition requirement/status  ASSESSMENT:   Pt with PMH of tobacco abuse, COPD, GERD admitted to [facility] with cough, weight loss and leg weakness. He was found to have a RUL mass with mets to the ribs and spine.  Pt likely meets criteria for severe malnutrition but unable to identify at this time.   6/25 OR s/p laminectomy and decompression of the thoracic spinal canal from T3-T5. Reconstruction with iliac crest allograft strut and fixation with pedicle screws from T2-T6 due to spinal cord compression and paraplegia  6/26 extubated  Medications reviewed and include: decadron, colace, senna LR @ 125 ml/hr Labs reviewed   Pt is 3 L positive since admission Drain: 150 ml output yesterday   NUTRITION - FOCUSED PHYSICAL EXAM:  Deferred, RD working remotely   Diet Order:   Diet Order            Diet NPO time specified  Diet effective now              EDUCATION NEEDS:      Skin:  Skin Assessment: Reviewed RN Assessment  Last BM:  6/22  Height:   Ht Readings from Last 1 Encounters:  04/21/19 _0  (1.753 m)    Weight:   Wt Readings from Last 1 Encounters:  04/21/19 46.8 kg    Ideal Body Weight:  73 kg  BMI:  Body mass index is 15.24 kg/m.  Estimated Nutritional Needs:   Kcal:  4585-9292  Protein:  74-82 gr  Fluid:  >1400 ml daily   Maylon Peppers RD, LDN, CNSC [phone] Pager [phone] After Hours Pager

## 2019-04-21 NOTE — Procedures (Signed)
Extubation Procedure Note  Patient Details:   Name: Paul Thornton DOB: 1961-04-22 MRN: [id number]   Airway Documentation:    Vent end date: 04/21/19 Vent end time: 0840   Evaluation  O2 sats: stable throughout Complications: No apparent complications Patient did tolerate procedure well. Bilateral Breath Sounds: Clear   Yes   Patient extubated to [place].  Vitals stable throughout. Patient tolerated well. RT will continue to monitor.  Mcneil Sober 04/21/2019, 8:50 AM

## 2019-04-21 NOTE — Progress Notes (Signed)
NAME:  Paul Thornton, MRN:  [id number], DOB:  1960/12/07, LOS: 2 ADMISSION DATE:  04/19/2019, CONSULTATION DATE:  04/21/2019 REFERRING MD:  Ellene Route, CHIEF COMPLAINT:  Post op vent mgmt   Brief History   Paul Thornton is a 58 y.o. male who was admitted to [facility] 6/24 with cough, weight loss, leg weakness.  He was found to have RUL mass with mets to the ribs and spine.  He underwent CT guided biopsy 6/24 and was taken to the OR overnight 6/25 for spinal debulking.  Post operatively, PCCM was asked to assist with vent management.  History of present illness   Paul Thornton is a 58 y.o. male who has a PMH including but not limited to long standing tobacco dependence, COPD, GERD (see "past medical history" for rest).  He presented to [facility] 6/24 with SOB, cough, weight loss, leg weakness.  He was found to have lung mass with mets to the ribs and spine.   He was evaluated by IR on 6/25 and had CT guided right lung mass of which results are still pending.  MRI of the brain was negative for brain mets but did demonstrate small punctate areas of ischemia.  Due to worsening leg weakness and MRI findings,  He was seen by Dr. Ellene Route of neurosurgery who recommended debulking of the tumor to create separation between it and spinal cord in hopes of avoiding further complications including paralysis.  He was also evaluated by Dr. Leonel Ramsay of neurology for further stroke workup / management.  Overnight 6/25 and into early AM 6/26, he was taken to the OR for laminectomy and decompression of the thoracic spinal canal from T3-T5 with greater than 75% vertebrectomies of T3 and T4, reconstruction with iliac crest allograft strut and fixation with pedicle screws with T2 - T6.  Post operatively, he remained intubated and was transferred to the neuro ICU for ongoing care.  PCCM was asked to assist with vent management.  Past Medical History  Tobacco abuse, COPD  Significant Hospital Events   6/24 > admit. 6/25 >  CT guided lung biopsy 6/26 > to OR for   Consults:  IR. Neurosurgery. Neurology. ENT. PCCM.  Procedures:  ETT 6/26 >  Right radial art line 6/26 >   T3-T5 decompression/tumor debulk/T2-T6 stabilization ([place])  Significant Diagnostic Tests:  CXR 6/24 > large RUL mass. CTA chest 6/24 > no PE, large neoplasm arising from posterior segment of RUL with invasion of adjacent paratracheal region on the right as well as posterior pleura and bony structures, most notably right 4-th and 5th ribs posteriorly and T4 and T5 verterbral bodies.  Extensive underlying emphysematous changes. Hepatic steatosis. Carotid US 6/25 > no significant stenosis. MRI T spine 6/25 > 7x8cm paraverterbral mass at T4 with invasion of T3 - T5. Pathologic T4 spinal compression fx.  Suspected additional 52mm left T12 mets. 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  No mets. Echo 6/26 >   Micro Data:  COVID negative (6/24)  Antimicrobials:  periop only   Interim history/subjective:  Awake, interactive, on SBT, follows requests, including moving LEs  Objective   Blood pressure (!) 108/57, pulse 81, temperature 97.9 F (36.6 C), temperature source Oral, resp. rate 15, height 5\' 9"  (1.753 m), weight 46.8 kg, SpO2 100 %.    Vent Mode: CPAP;PSV FiO2 (%):  [30 %-40 %] 30 % Set Rate:  [16 bmp] 16 bmp Vt Set:  [560 mL] 560 mL PEEP:  [5  cmH20] 5 cmH20 Pressure Support:  [5 [place] Pressure:  [11 cmH20-12 cmH20] 12 cmH20   Intake/Output Summary (Last 24 hours) at 04/21/2019 0823 Last data filed at 04/21/2019 0700 Gross per 24 hour  Intake 4524.63 ml  Output 2700 ml  Net 1824.63 ml   Filed Weights   04/19/19 0957 04/20/19 1826 04/21/19 0219  Weight: 40.8 kg 42.5 kg 46.8 kg    Examination: General: NAD, thin HENT: AT/[place], intubated Lungs: CTAB Cardiovascular: RRR no r/m/g Abdomen: SND Extremities: no edema, 2+ pulses Neuro: CNII-XII grossly intact, MAE on request  including hip flexors, toes GU: catheter  Resolved Hospital Problem list     Assessment & Plan:  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 Neuro exam improved, pt makes criteria for extubation.  PLAN:  Post op ventilation.  Weaned well, will extubate and observe.  NEURO: -per NSU, neurology,   Continue serial exams  LUNG CA: will need oncologic management   Best practice:  Diet: NPO for now, will advance to clears and beyond as tolerated Pain/Anxiety/Delirium protocol (if indicated): n/a VAP protocol (if indicated): n/a after extubation DVT prophylaxis: SCDs, acute postop GI prophylaxis: HOB >30, PPI Glucose control: monitor Mobility: bed for now, PT to eval after extubation Code Status: full Family Communication: pt may be able to contact later today Disposition:   Labs   CBC: Recent Labs  Lab 04/19/19 1114 04/20/19 0446 04/20/19 2314 04/21/19 0018 04/21/19 0307 04/21/19 0413  WBC 11.8* 10.2  --   --   --  13.9*  NEUTROABS 9.8*  --   --   --   --   --   HGB 11.3* 10.3* 8.8* 11.6* 10.9* 11.3*  HCT 37.3* 33.2* 26.0* 34.0* 32.0* 34.0*  MCV 91.4 90.0  --   --   --  87.9  PLT 409* 406*  --   --   --  604    Basic Metabolic Panel: Recent Labs  Lab 04/19/19 1114 04/20/19 0446 04/20/19 2314 04/21/19 0018 04/21/19 0307 04/21/19 0413  NA 141 137 137 136 136 136  K 4.2 3.8 3.8 4.4 3.7 3.9  CL 99 103  --   --   --  104  CO2 27 23  --   --   --  22  GLUCOSE 122* 127*  --   --   --  125*  BUN 11 13  --   --   --  15  CREATININE 0.66 0.59*  --   --   --  0.62  CALCIUM 8.9 8.9  --   --   --  8.8*   GFR: Estimated Creatinine Clearance: 68.3 mL/min (by C-G formula based on SCr of 0.62 mg/dL). Recent Labs  Lab 04/19/19 1114 04/20/19 0446 04/21/19 0413  WBC 11.8* 10.2 13.9*    Liver Function Tests: Recent Labs  Lab 04/19/19 1114  AST 28  ALT 36  ALKPHOS 137*  BILITOT 0.6  PROT 7.2  ALBUMIN 2.5*    No results for input(s): LIPASE, AMYLASE in the last 168 hours. No results for input(s): AMMONIA in the last 168 hours.  ABG    Component Value Date/Time   PHART 7.469 (H) 04/21/2019 0307   PCO2ART 34.6 04/21/2019 0307   PO2ART 187.0 (H) 04/21/2019 0307   HCO3 25.1 04/21/2019 0307   TCO2 26 04/21/2019 0307   O2SAT 100.0 04/21/2019 0307     Coagulation Profile: Recent Labs  Lab 04/20/19  0636  INR 1.0    Cardiac Enzymes: No results for input(s): CKTOTAL, CKMB, CKMBINDEX, TROPONINI in the last 168 hours.  HbA1C: Hgb A1c MFr Bld  Date/Time Value Ref Range Status  04/20/2019 04:46 AM 6.0 (H) 4.8 - 5.6 % Final    Comment:    (NOTE) Pre diabetes:          5.7%-6.4% Diabetes:              >6.4% Glycemic control for   <7.0% adults with diabetes     CBG: No results for input(s): GLUCAP in the last 168 hours.  Review of Systems:   N/a with intubation  Past Medical History  He,  has a past medical history of Bronchitis and COPD (chronic obstructive pulmonary disease) ([place]).   Surgical History   History reviewed. No pertinent surgical history.   Social History   reports that he has been smoking. He has been smoking about 0.25 packs per day. He has never used smokeless tobacco. He reports that he does not drink alcohol or use drugs.   Family History   His family history is not on file.   Allergies Allergies  Allergen Reactions  . Amoxicillin Hives    All "cillins"  . Penicillins Hives    Did it involve swelling of the face/tongue/throat, SOB, or low BP? No Did it involve sudden or severe rash/hives, skin peeling, or any reaction on the inside of your mouth or nose? No Did you need to seek medical attention at a hospital or doctor's office? No When did it last happen? If all above answers are "NO", may proceed with cephalosporin use.     Home Medications  Prior to Admission medications   Medication Sig Start Date End Date Taking? Authorizing Provider   albuterol (PROVENTIL HFA;VENTOLIN HFA) 108 (90 BASE) MCG/ACT inhaler Inhale 1-2 puffs into the lungs every 6 (six) hours as needed for wheezing or shortness of breath. Patient not taking: Reported on 04/19/2019 01/28/14   Ripley Fraise, MD  predniSONE (DELTASONE) 50 MG tablet One tablet PO daily for 4 days Patient not taking: Reported on 04/19/2019 01/28/14   Ripley Fraise, MD     Critical care time:  I have independently seen and examined the patient, reviewed data, and developed an assessment and plan. A total of 34 minutes were spent in critical care assessment and medical decision making. This critical care time does not reflect procedure time, or teaching time or supervisory time of PA/NP/Med student/Med Resident, etc but could involve care discussion time.    Bonna Gains, MD PhD 04/21/19 8:35 AM

## 2019-04-21 NOTE — Progress Notes (Signed)
Orthopedic Tech Progress Note Patient Details:  Paul Thornton December 24, 1960 [id number] Called in order to [facility] for a TLSO BRACE. Patient ID: Paul Thornton, male   DOB: [birth year], 58 y.o.   MRN: [id number]   Janit Pagan 04/21/2019, 8:34 AM

## 2019-04-21 NOTE — Progress Notes (Deleted)
eLink Physician-Brief Progress Note Patient Name: OSMANY AZER DOB: [birth year] MRN: [id number]   Date of Service  04/21/2019  HPI/Events of Note  Pt admitted to [facility] ICU through the ED for out of hospital cardiac arrest secondary to hemorrhagic shock caused by GI bleeding. Pt also has respiratory  Failure and is on the ventilator. Pt has cavitary lung lesions.  eICU Interventions  New patient evaluation completed.        Kerry Kass Ogan 04/21/2019, 2:32 AM

## 2019-04-21 NOTE — Op Note (Signed)
Date of surgery: 04/21/2019 Preoperative diagnosis: Metastatic cancer from the lung invading the right side of the vertebrae at T3-T4 and T5 with spinal cord compression and paraplegia. Postoperative diagnosis: Same Procedure: Laminectomy and decompression of the thoracic spinal canal from T3-T5 with partial greater than 70% vertebrectomies of T3 and T4.  Reconstruction with iliac crest allograft strut and fixation with pedicle screws from T2-T6. Surgeon: Kristeen Miss Anesthesia: General endotracheal Indications: Paul Thornton is a 58 year old individual with a new diagnosis of cancer in the chest.  He was developing increasing back pain and then the last couple days developed weakness in his legs such that he could not walk he was found to have a large metastatic lesion of the chest involving the right upper lobe of the lung that transgressed through the pleura the ribs and into the vertebrae.  This is caused him a significant paralysis in his legs and surgery is now being done to urgently decompress this process in an effort to salvage and improve some neurologic function in his lower extremities.  Procedure: Patient was brought to the operating room supine on stretcher.  After the smooth induction of general endotracheal anesthesia he was carefully turned prone onto the operating table.  A foam doughnut was used to protect the head and parallel rolls were used to protect the back.  The back of his neck down to his chest wall was prepped with alcohol DuraPrep and draped in a sterile fashion.  Then a midline incision was created and carried down to the thoracodorsal fascia which was opened on either side of midline at approximately levels of T3-T4-T5.  These areas were localized fluoroscopically.  Then as the dissection was carried down further into the paraspinous musculature area of mass of abnormal tissue was encountered in the paraspinous region on the right side.  This was not encountered as a tumor and  several large pieces of tumor were removed using a [place] rongeur it was evident that this had invaded the lateral aspect of the vertebrae of T4 and T3 and removal of this yielded the epidural space which had significant tumor in it this was removed the tumor was a gelatinous consistency with only minimal bleeding.  As the tumor was decompressed it became evident that several of the vertebrae were involved including T3-T4 and T5 a and significant corpectomies involving greater than 70% of the vertebral body were performed at T4 and T3.  Once this was decompressed a large portion of a laminectomy was performed also and reconstruction of the ventral support system would be required we obtained an allograft of iliac crest bone that was 60 mm long that was contoured to fit into the defect that was created from the corpectomy and this was then secured with 2 pedicle screws 1 in T3 and 1 T4.  This was then attached to the rod after pedicle screws were placed in T5 and T6.  Pedicle screws were placed in T5 and T6 using AP and lateral fluoroscopy and 5.5 x 40 mm screws were placed in T5 and T6 at T2 4.5 x 35 mm screws were placed and 4.5 x 35 mm screws were placed in T3.  Once the screws were placed and positioning was checked in AP and lateral projection a rod was contoured to fit in the sagittals in a neutral construct between T2 and T6.  These were then tightened in a neutral construct and a transverse connector was applied.  Final radiographs were obtained and this demonstrated good fixation  with good alignment in the coronal and sagittal planes.  Finally the dura was checked for continuity and to make sure it was adequately decompressed from T3-T5 over the range of the tumor that was noted on the scans.  When this was verified the lumbar thoracodorsal fascia was closed over a large Hemovac drain and 2-0 Vicryl was used in the superficial fascia and 3-0 Vicryl subcuticularly surgical staples were placed in the skin  blood loss for the procedure was estimated at 1200 cc.  Patient did receive a unit or 2 of red cells in the OR and he was transferred to the ICU on the ventilator for further recovery.  Should note that as the decompression was performed the lateral roots of T3-T4 and T5 were sacrificed on the right side these were tied off.  3-0 silk was used for the tie off.  No spinal fluid leaks were encountered.

## 2019-04-21 NOTE — Progress Notes (Signed)
Patient ID: Paul Thornton, male   DOB: 04/21/1961, 58 y.o.   MRN: [id number] Vital signs are stable Motor function appears markedly improved with at least 3+ to 4- strength in both lower extremities.  He complains of significant back pain.  Hemovac with moderate output.  I will be out of the office for the next weeks time I will asked Dr. Deatra Ina to follow him in my absence.

## 2019-04-21 NOTE — Progress Notes (Signed)
Echocardiogram 2D Echocardiogram has been performed.  Oneal Deputy Sanjiv Castorena 04/21/2019, 10:16 AM

## 2019-04-21 NOTE — Progress Notes (Signed)
Lower extremity venous has been completed.   Preliminary results in CV Proc.   Paul Thornton 04/21/2019 11:08 AM

## 2019-04-21 NOTE — Progress Notes (Signed)
Occupational Therapy Evaluation  Pt presents to OT with the below listed deficits and diagnosis.  Presents with paraparesis, increased pain, impaired balance, and impaired cognition - he scored 8/15 on the BIMS which is indicative of moderate cognitive impairment (pt reports he feels this is due to pain meds).  He currently requires max A, overall for LB ADLs and mod A +2 for functional mobility.  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   Recommend CIR level therapies. Acute Rehabilitation Services     04/21/19 1400  OT Visit Information  Last OT Received On 04/21/19  Assistance Needed +2  PT/OT/SLP Co-Evaluation/Treatment Yes  Reason for Co-Treatment Complexity of the patient's impairments (multi-system involvement);For patient/therapist safety;To address functional/ADL transfers  PT goals addressed during session Mobility/safety with mobility;Balance;Strengthening/ROM  OT goals addressed during session Strengthening/ROM;ADL's and self-care  History of Present Illness This 58 y.o. male admitted with progressive back and chest wall pain as well as LE weakness and falls.  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   He underwent resection of tumor, laminectomy and decompression of T3-5 with fusion from T2-6.  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  No cranial metastatic dissease seen.  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  Sisters are taking care of her now  Prior Function  Level of Independence Independent  Comments Pt enjoys fishing.  Pt independent in ADLs, drives, community ambulator(takes mother to her appointments, shops for her)  Communication  Communication No difficulties  Pain Assessment  Pain Assessment Faces  Pain Score 5  Faces Pain Scale 6  Pain Location mid back shoulders  Pain Descriptors / Indicators Grimacing;Guarding;Operative site guarding  Pain Intervention(s) Monitored during session;Limited activity within patient's tolerance;Repositioned  Cognition  Arousal/Alertness Awake/alert  Behavior During Therapy WFL for tasks assessed/performed  Overall Cognitive Status Impaired/Different from baseline  Area of Impairment Orientation;Attention;Memory;Awareness;Problem solving  Orientation Level Disoriented to;Time  Current Attention Level Selective  Memory Decreased short-term memory  Awareness Intellectual  Problem Solving Requires verbal cues  General Comments administered the BIMS.  He scored 8/15 which is indicative of moderate impairment.  He initially thought it was 2018, then 2019, but was able to eventually self correct.  He was only able to recall one 1 word out of three when a cue was provided.   He contributes cognitive deficit to pain meds   Upper Extremity Assessment  Upper Extremity Assessment Generalized weakness  Lower Extremity Assessment  Lower Extremity Assessment Defer to PT evaluation  Cervical / Trunk Assessment  Cervical / Trunk Assessment Normal  Cervical / Trunk Exceptions post op thoracic decompression and fusion  ADL  Overall ADL's  Needs assistance/impaired  Eating/Feeding Independent  Grooming Wash/dry hands;Wash/dry  face;Brushing  hair;Set up;Sitting  Upper Body Bathing Minimal assistance;Sitting  Lower Body Bathing Maximal assistance;Sit to/from stand  Upper Body Dressing  Sitting;Moderate assistance  Lower Body Dressing Total assistance;Sit to/from Retail buyer Moderate assistance;+2 for physical assistance;+2 for safety/equipment;Stand-pivot;BSC;RW  Toileting- Clothing Manipulation and Hygiene Total assistance;Sit to/from stand  Functional mobility during ADLs Moderate assistance;+2 for physical assistance;+2 for safety/equipment  General ADL Comments Pt limited by pain and weakness   Vision- History  Baseline Vision/History Wears glasses  Patient Visual Report No change from baseline  Vision- Assessment  Vision Assessment? No apparent visual deficits  Additional Comments Pt denies changes.  Visual fields and pursuits WFL.  Pt has been reading menu and other items without difficulty   Perception  Perception Tested? Yes  Praxis  Praxis tested? Deficits  Bed Mobility  Overal bed mobility Needs Assistance  Bed Mobility Rolling;Sidelying to Sit  Rolling Min assist;+2 for safety/equipment  Sidelying to sit Min assist;+2 for safety/equipment  General bed mobility comments Min assist to roll to left, and come up to sitting EOB.  Support needed at trunk during transition, pt managing legs on his own.   Transfers  Overall transfer level Needs assistance  Equipment used 2 person hand held assist  Transfers Sit to/from Stand  Sit to Stand +2 physical assistance;Mod assist  Stand pivot transfers +2 physical assistance;Mod assist  General transfer comment Two person mod hand held assist to stand and pivot to the recliner chair from the bed, hyper extension moment at left knee during stance.   Balance  Overall balance assessment Needs assistance  Sitting-balance support Feet supported;Bilateral upper extremity supported  Sitting balance-Leahy Scale Fair  Sitting balance - Comments supervision  EOB, difficulty due to pain with weight shifting.   Standing balance support Bilateral upper extremity supported  Standing balance-Leahy Scale Poor  Standing balance comment needs external support in standing.   General Comments  General comments (skin integrity, edema, etc.) VSS.  pt instructed how to don brace   OT - End of Session  Equipment Utilized During Treatment Back brace  Activity Tolerance Patient limited by pain;Patient tolerated treatment well  Patient left in chair;with call bell/phone within reach  Nurse Communication Mobility status  OT Assessment  OT Recommendation/Assessment Patient needs continued OT Services  OT Visit Diagnosis Unsteadiness on feet (R26.81);Muscle weakness (generalized) (M62.81);Pain  Pain - part of body  (back )  OT Problem List Decreased strength;Decreased activity tolerance;Impaired balance (sitting and/or standing);Decreased cognition;Decreased safety awareness;Decreased knowledge of use of DME or AE;Pain  Barriers to Discharge Decreased caregiver support  Barriers to Discharge Comments 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 4  Help from another person taking care of personal grooming? 3  Help from another person toileting, which includes using toliet, bedpan, or urinal? 2  Help from another person bathing (including washing, rinsing, drying)? 2  Help from another person to put on and taking off regular upper body clothing? 2  Help from another person to put on and taking off regular lower body clothing? 1  6 Click Score 14  OT Recommendation  Recommendations for Other Services Rehab consult  Follow Up  Recommendations CIR;Supervision/Assistance - 24 hour  OT Equipment None recommended by OT  Individuals Consulted  Consulted and Agree with  Results and Recommendations Patient  Acute Rehab OT Goals  Patient Stated Goal to go fishing   OT Goal Formulation With patient  Time For Goal Achievement 05/05/19  Potential to Achieve Goals Good  OT Time Calculation  OT Start Time (ACUTE ONLY) 1358  OT Stop Time (ACUTE ONLY) 1422  OT Time Calculation (min) 24 min  OT General Charges  $OT Visit 1 Visit  OT Evaluation  $OT Eval Moderate Complexity 1 Mod  Written Expression  Dominant Hand Left   Lucille Passy, OTR/L Acute Rehabilitation Services Pager [phone] Office [phone]

## 2019-04-21 NOTE — Progress Notes (Signed)
STROKE TEAM PROGRESS NOTE   INTERVAL HISTORY Admitted for complaints on worsening SOB, weight loss, back/chest pain, and worsening bilateral leg weakness. 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 MRI brain showed small punctate areas of ischemia. Hx of COPD and cigarette smoking. Underwent laminectomy and decompression of thoracic spinal canal from T3-T5 with NSG early AM of 04/21/19. He was extubated to [place] 4L and resting comfortably. On rounds, patient was alert, awake, able to follow commands and moving all extremities. Weakness has improved significantly per patient.  He denies any focal symptoms of prior stroke or TIA Vitals:   04/21/19 0805 04/21/19 0806 04/21/19 0851 04/21/19 0900  BP: (!) 108/57   105/66  Pulse: 81   86  Resp: 15   18  Temp:  (!) 97.3 F (36.3 C)    TempSrc:  Axillary    SpO2:   100% 100%  Weight:      Height:        CBC:  Recent Labs  Lab 04/19/19 1114 04/20/19 0446  04/21/19 0307 04/21/19 0413  WBC 11.8* 10.2  --   --  13.9*  NEUTROABS 9.8*  --   --   --   --   HGB 11.3* 10.3*   < > 10.9* 11.3*  HCT 37.3* 33.2*   < > 32.0* 34.0*  MCV 91.4 90.0  --   --  87.9  PLT 409* 406*  --   --  276   < > = values in this interval not displayed.    Basic Metabolic Panel:  Recent Labs  Lab 04/20/19 0446  04/21/19 0307 04/21/19 0413  NA 137   < > 136 136  K 3.8   < > 3.7 3.9  CL 103  --   --  104  CO2 23  --   --  22  GLUCOSE 127*  --   --  125*  BUN 13  --   --  15  CREATININE 0.59*  --   --  0.62  CALCIUM 8.9  --   --  8.8*   < > = values in this interval not displayed.   Lipid Panel: No results found for: CHOL, TRIG, HDL, CHOLHDL, VLDL, LDLCALC HgbA1c:  Lab Results  Component Value Date   HGBA1C 6.0 (H) 04/20/2019   Urine Drug Screen: No results found for: LABOPIA, COCAINSCRNUR, LABBENZ, AMPHETMU, THCU, LABBARB  Alcohol Level No results found for: [place]  Thoracic spine 2 view 6/26-  Fluoroscopy for intraoperative  guidance and upper thoracic fusion.   CTA chest wwo contrast 6/24- Large neoplasm arising from the posterior segment right upper lobe with invasion of the adjacent paratracheal region on the right as well as right posterior pleura and bony structures, most notably portions of the right fourth and fifth ribs posteriorly and T4 and T5 vertebral bodies. Marked collapse of the T4 vertebral body noted. Invasion of the posterior elements at T5 on the right noted. This mass measures 7.2 x 6.2 x 6.9 cm. 3.  Extensive underlying emphysematous change. 4.  Aortopulmonary window region adenopathy.   MRA w/o contrast  6/25- Mild intracranial atherosclerotic disease. No proximal flow-limiting stenosis, occlusion, or dissection.  MR brain wwo contrast 04/20/19- Small subcentimeter acute RIGHT frontal MCA territory infarcts, nonhemorrhagic. 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 Premature for age atrophy. White matter disease disproportionally affects the brainstem, raising the question of chronic  microvascular ischemic change. No abnormal intracranial enhancement of the brain or meninges to suggest metastatic disease.  MR thoracic spine wwo contrast 6/25- 7 x 8 cm paravertebral mass centered at T4 on the RIGHT. Invasion of T3, T4, and T5. Pathologic T4 compression fracture. 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 Neurosurgical consultation is warranted. No compressive disc herniation. Suspected additional 12 mm LEFT T12 metastasis, incompletely evaluated.   US carotid bilateral 6/25- Color duplex indicates minimal heterogeneous plaque, with no hemodynamically significant stenosis by duplex criteria in the extracranial cerebrovascular circulation.  CT chest 1/61- no complication following biopsy 6/26- stable  PHYSICAL EXAM Cachectic appearing  Middle  aged Caucasian malnourished appearing man, resting comfortably, no acute distress RRR CTAB Neurological Exam :  Alert and Oriented x3 normal speech and language. No dysarthria.  Extraocular movements are full range without nystagmus.  Blinks to threat bilaterally.  Face is symmetric without weakness.  Tongue midline.  Motor system exam reveals no upper extremity drift. Follows commands on all extremities 5/5 on BUE.  No focal weakness.  Mild intention tremor in bilateral upper extremities which is chronic R ankle dorsiflexion 4/5 R hip flexion 4+/5.  Normal strength in hip abductor's, adductor's, knee flexors and extensors and plantar flexion flexion Left lower extremity 3/5 weakness of hip flexors and ankle dorsiflexors.  4/5 weakness of hip abductor's adductor's and knee extensors and plantar flexors. Sensation decreased   below nipple line bilaterally right greater than left Right ankle clonus.  Tone is increased bilateral lower extremities right greater than left.  Deep tendon flexes are brisk in upper extremities and exaggerated in both lower extremities right greater than left.  Both plantars are upgoing.  ASSESSMENT/PLAN Paul Thornton is a 58 y.o. male with history of COPD and smoking presenting with worsening bilateral weakness, SOB, hx of weight loss, and back/chest pain, found to have a malignant lung mass with spinal intrusion at levels T2-T6 and concern for acute on chronic stroke on R frontal lobe.   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  Reconstruction with iliac crest allograft strut and fixation with pedicle screws from T2-T6.  Pathology pending  Other Stroke Risk Factors  Cigarette smoker- patient reports he has stopped  Other Active Problems  Malnutrition  Dietician on board, follow [facility] day # 2 I have personally obtained history,examined this patient, reviewed notes, independently viewed imaging studies, participated in medical decision making and plan of care.ROS completed by me personally and pertinent positives fully documented  I have made any  additions or clarifications directly to the above note.  Patient has presented with bilateral leg weakness secondary to spinal cord compression from malignant spinal lesion from lung metastatic lung cancer.  He denies any focal left body symptoms suggestive of stroke or TIA but MRI shows age-indeterminate right frontal MCA branch embolic infarct.  Continue ongoing stroke work-up check echocardiogram.  I would not recommend TEE for marantic endocarditis as the findings are unlikely to impact treatment.  Continue aspirin and treatment for ongoing metastatic lung cancer as per oncology.  Physical occupational therapy consults.  Greater than 50% time during this 35-minute visit was spent on counseling and coordination of care about his silent infarcts and discussion about relationship with his cancer and answering  questions  Antony Contras, [place] Pager: [phone] 04/21/2019 12:56 PM   To contact Stroke Continuity provider, please refer to [email or web address] After hours, contact General Neurology

## 2019-04-21 NOTE — Transfer of Care (Signed)
Immediate Anesthesia Transfer of Care Note  Patient: Paul Thornton  Procedure(s) Performed: POSTERIOR THORACIC FUSION 5 LEVEL (N/A Back)  Patient Location: ICU  Anesthesia Type:General  Level of Consciousness: sedated  Airway & Oxygen Therapy: Patient remains intubated per anesthesia plan and Patient placed on Ventilator (see vital sign flow sheet for setting)  Post-op Assessment: Report given to RN and Post -op Vital signs reviewed and stable  Post vital signs: Reviewed and stable  Last Vitals:  Vitals Value Taken Time  BP 111/79 04/21/19 0214  Temp    Pulse 127 04/21/19 0217  Resp 16 04/21/19 0217  SpO2 100 % 04/21/19 0217  Vitals shown include unvalidated device data.  Last Pain:  Vitals:   04/20/19 2035  TempSrc: Oral  PainSc:       Patients Stated Pain Goal: 2 (44/46/19 0122)  Complications: No apparent anesthesia complications

## 2019-04-21 NOTE — Progress Notes (Signed)
Assisted tele visit to patient with family member.  Thomas, Kirby Argueta Renee, RN   

## 2019-04-21 NOTE — Progress Notes (Signed)
TCD bubble study has been completed.   Preliminary results in CV Proc.   Abram Sander 04/21/2019 1:04 PM

## 2019-04-21 NOTE — Consult Note (Addendum)
NAME:  Paul Thornton, MRN:  [id number], DOB:  1961/04/26, LOS: 2 ADMISSION DATE:  04/19/2019, CONSULTATION DATE:  04/21/19 REFERRING MD:  Ellene Route  CHIEF COMPLAINT:  Vent Management   Brief History   Paul Thornton is a 58 y.o. male who was admitted to [facility] 6/24 with cough, weight loss, leg weakness.  He was found to have RUL mass with mets to the ribs and spine.  He underwent CT guided biopsy 6/24 and was taken to the OR overnight 6/25 for spinal debulking.  Post operatively, PCCM was asked to assist with vent management.  History of present illness   Pt is encephelopathic; therefore, this HPI is obtained from chart review. Paul Thornton is a 58 y.o. male who has a PMH including but not limited to long standing tobacco dependence, COPD, GERD (see "past medical history" for rest).  He presented to [facility] 6/24 with SOB, cough, weight loss, leg weakness.  He was found to have lung mass with mets to the ribs and spine.   He was evaluated by IR on 6/25 and had CT guided right lung mass of which results are still pending.  MRI of the brain was negative for brain mets but did demonstrate small punctate areas of ischemia.  Due to worsening leg weakness and MRI findings,  He was seen by Dr. Ellene Route of neurosurgery who recommended debulking of the tumor to create separation between it and spinal cord in hopes of avoiding further complications including paralysis.  He was also evaluated by Dr. Leonel Ramsay of neurology for further stroke workup / management.  Overnight 6/25 and into early AM 6/26, he was taken to the OR for laminectomy and decompression of the thoracic spinal canal from T3-T5 with greater than 75% vertebrectomies of T3 and T4, reconstruction with iliac crest allograft strut and fixation with pedicle screws with T2 - T6.  Post operatively, he remained intubated and was transferred to the neuro ICU for ongoing care.  PCCM was asked to assist with vent management.   Past Medical History  Tobacco  dependence, COPD.  Significant Hospital Events   6/24 > admit. 6/25 > CT guided lung biopsy 6/26 > to OR for  Consults:  IR. Neurosurgery. Neurology. ENT. PCCM.  Procedures:  ETT 6/26 >  Right radial art line 6/26 >   Significant Diagnostic Tests:  CXR 6/24 > large RUL mass. CTA chest 6/24 > no PE, large neoplasm arising from posterior segment of RUL with invasion of adjacent paratracheal region on the right as well as posterior pleura and bony structures, most notably right 4-th and 5th ribs posteriorly and T4 and T5 verterbral bodies.  Extensive underlying emphysematous changes. Hepatic steatosis. Carotid US 6/25 > no significant stenosis. MRI T spine 6/25 > 7x8cm paraverterbral mass at T4 with invasion of T3 - T5. Pathologic T4 spinal compression fx.  Suspected additional 75mm left T12 mets. 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  No mets. Echo 6/26 >   Micro Data:  SARS CoV 2 6/24 > negative.  Antimicrobials:  None.   Interim history/subjective:  Sedated, not responsive.  Objective:  Blood pressure 117/75, pulse 88, temperature 98.8 F (37.1 C), temperature source Oral, resp. rate 20, height 5\' 9"  (1.753 m), weight 42.5 kg, SpO2 95 %.        Intake/Output Summary (Last 24 hours) at 04/21/2019 0148 Last data filed at 04/21/2019 0137 Gross per 24 hour  Intake 4652.59 ml  Output 2800 ml  Net 1852.59 ml   Filed Weights   04/19/19 0957 04/20/19 1826  Weight: 40.8 kg 42.5 kg    Examination: General: Adult male, in NAD. Neuro: Sedated, not responsive. HEENT: [place]/AT. Sclerae anicteric.  ETT in place. Cardiovascular: RRR, no M/R/G.  Lungs: Respirations even and unlabored.  CTA bilaterally, No W/R/R.  Abdomen: BS x 4, soft, NT/ND.  Musculoskeletal: No gross deformities, no edema.  Skin: Intact, warm, no rashes.  Assessment & Plan:   Respiratory insufficiency - requiring intubation for OR procedure. - Full vent support. - Assess ABG.  - SBT in AM. - Anticipate difficulty with weaning from vent given significant underlying COPD / emphysema. - Bronchial hygiene. - Follow CXR.  Presumed RUL lung CA - s/p CT guided biopsy 6/25. - F/u on pathology. - Further care per oncology.  Hx COPD with probable exacerbation. - Continue bronchodilators, steroids, doxy.  Tobacco dependence. - Nicotine patch.  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  OR note pending. - Post op care per neurosurgery. - Further care per oncology.  Acute right MCA infarct. - Neurology following / managing. - Stroke workup per neuro.  Anemia. - Transfuse for Hgb < 7.  Best Practice:  Diet: NPO. Pain/Anxiety/Delirium protocol (if indicated): Fentanyl gtt / Midazolam PRN.  RASS goal -1. VAP protocol (if indicated): In place. DVT prophylaxis: SCD's / Heparin. GI prophylaxis: PPI. Glucose control: None. Mobility: Bedrest. Code Status: Full. Family Communication: None available. Disposition: ICU.  Labs   CBC: Recent Labs  Lab 04/19/19 1114 04/20/19 0446  WBC 11.8* 10.2  NEUTROABS 9.8*  --   HGB 11.3* 10.3*  HCT 37.3* 33.2*  MCV 91.4 90.0  PLT 409* 403*   Basic Metabolic Panel: Recent Labs  Lab 04/19/19 1114 04/20/19 0446  NA 141 137  K 4.2 3.8  CL 99 103  CO2 27 23  GLUCOSE 122* 127*  BUN 11 13  CREATININE 0.66 0.59*  CALCIUM 8.9 8.9   GFR: Estimated Creatinine Clearance: 62 mL/min (A) (by C-G formula based on SCr of 0.59 mg/dL (L)). Recent Labs  Lab 04/19/19 1114 04/20/19 0446  WBC 11.8* 10.2   Liver Function Tests: Recent Labs  Lab 04/19/19 1114  AST 28  ALT 36  ALKPHOS 137*  BILITOT 0.6  PROT 7.2  ALBUMIN 2.5*   No results for input(s): LIPASE, AMYLASE in the last 168 hours. No results for input(s): AMMONIA in the last 168 hours. ABG No results found for: PHART, PCO2ART, PO2ART, HCO3, TCO2, ACIDBASEDEF, O2SAT  Coagulation Profile: Recent  Labs  Lab 04/20/19 0636  INR 1.0   Cardiac Enzymes: No results for input(s): CKTOTAL, CKMB, CKMBINDEX, TROPONINI in the last 168 hours. HbA1C: Hgb A1c MFr Bld  Date/Time Value Ref Range Status  04/20/2019 04:46 AM 6.0 (H) 4.8 - 5.6 % Final    Comment:    (NOTE) Pre diabetes:          5.7%-6.4% Diabetes:              >6.4% Glycemic control for   <7.0% adults with diabetes    CBG: No results for input(s): GLUCAP in the last 168 hours.  Review of Systems:   Unable to obtain as pt is encephalopathic.  Past medical history  He,  has a past medical history of Bronchitis and COPD (chronic obstructive pulmonary disease) ([place]).   Surgical History   History reviewed. No pertinent surgical history.   Social History  reports that he has been smoking. He has been smoking about 0.25 packs per day. He has never used smokeless tobacco. He reports that he does not drink alcohol or use drugs.   Family history   His family history is not on file.   Allergies Allergies  Allergen Reactions  . Amoxicillin Hives    All "cillins"  . Penicillins Hives    Did it involve swelling of the face/tongue/throat, SOB, or low BP? No Did it involve sudden or severe rash/hives, skin peeling, or any reaction on the inside of your mouth or nose? No Did you need to seek medical attention at a hospital or doctor's office? No When did it last happen? If all above answers are "NO", may proceed with cephalosporin use.     Home meds  Prior to Admission medications   Medication Sig Start Date End Date Taking? Authorizing Provider  albuterol (PROVENTIL HFA;VENTOLIN HFA) 108 (90 BASE) MCG/ACT inhaler Inhale 1-2 puffs into the lungs every 6 (six) hours as needed for wheezing or shortness of breath. Patient not taking: Reported on 04/19/2019 01/28/14   Ripley Fraise, MD  predniSONE (DELTASONE) 50 MG tablet One tablet PO daily for 4 days Patient not taking: Reported on 04/19/2019 01/28/14   Ripley Fraise, MD    Critical care time: 40 min.    Montey Hora, [facility] Pager: [phone].  If no answer, [phone] 04/21/2019, 1:48 AM  Patient seen and examined, agree with above note.  I dictated the care and orders written for this patient under my direction. End stage copd End stage lung cancer with spine mets s/p decompression and jp drain Acute on chronic resp failure with hypoxemia intubated post op Steroids and nebs  Cc time 40 minutes.  Roxanne Mins, [place]

## 2019-04-21 NOTE — Progress Notes (Signed)
  Speech Language Pathology  Patient Details Name: KEYSTON ARDOLINO MRN: [id number] DOB: 1961/04/30 Today's Date: 04/21/2019 Time:  -      Pt passed [place] 3 oz water screen with nurse. She reports he is eating lunch without any difficulties. No swallow assessment needed.                Houston Siren 04/21/2019, 2:37 PM   Orbie Pyo Colvin Caroli.Ed Risk analyst [phone] Office [phone]

## 2019-04-21 NOTE — Progress Notes (Signed)
Pharmacy Antibiotic Note  Paul Thornton is a 58 y.o. male admitted on 04/19/2019 with surgical prophylaxis.  Pharmacy has been consulted for vancomycin dosing. Received 1gm vanc at ~ 02:00  Plan: Vancomycin 1gm 12 hours after initial dose Will f/u need for further Abx  Height: 5\' 9"  (175.3 cm) Weight: 93 lb 12.8 oz (42.5 kg) IBW/kg (Calculated) : 70.7  Temp (24hrs), Avg:98.1 F (36.7 C), Min:97.4 F (36.3 C), Max:98.8 F (37.1 C)  Recent Labs  Lab 04/19/19 1114 04/20/19 0446  WBC 11.8* 10.2  CREATININE 0.66 0.59*    Estimated Creatinine Clearance: 62 mL/min (A) (by C-G formula based on SCr of 0.59 mg/dL (L)).    Allergies  Allergen Reactions  . Amoxicillin Hives    All "cillins"  . Penicillins Hives    Did it involve swelling of the face/tongue/throat, SOB, or low BP? No Did it involve sudden or severe rash/hives, skin peeling, or any reaction on the inside of your mouth or nose? No Did you need to seek medical attention at a hospital or doctor's office? No When did it last happen? If all above answers are "NO", may proceed with cephalosporin use.    Thank you for allowing pharmacy to be a part of this patient's care.  Excell Seltzer [place] 04/21/2019 2:24 AM

## 2019-04-22 LAB — GLUCOSE, CAPILLARY
Glucose-Capillary: 159 mg/dL — ABNORMAL HIGH (ref 70–99)
Glucose-Capillary: 104 mg/dL — ABNORMAL HIGH (ref 70–99)
Glucose-Capillary: 144 mg/dL — ABNORMAL HIGH (ref 70–99)
Glucose-Capillary: 115 mg/dL — ABNORMAL HIGH (ref 70–99)

## 2019-04-22 MED ORDER — ASPIRIN EC 325 MG PO TBEC
325.0000 mg | DELAYED_RELEASE_TABLET | Freq: Every day | ORAL | Status: DC
  Administered 2019-04-23 – 2019-04-26 (×4): 325 mg via ORAL
  Filled 2019-04-22 (×4): qty 1

## 2019-04-22 MED ORDER — INSULIN ASPART 100 UNIT/ML ~~LOC~~ SOLN
1.0000 [IU] | SUBCUTANEOUS | Status: DC
  Administered 2019-04-22: 1 [IU] via SUBCUTANEOUS
  Administered 2019-04-22: 2 [IU] via SUBCUTANEOUS
  Administered 2019-04-23 (×2): 1 [IU] via SUBCUTANEOUS
  Administered 2019-04-25: 2 [IU] via SUBCUTANEOUS
  Administered 2019-04-25: 1 [IU] via SUBCUTANEOUS
  Administered 2019-04-25: 2 [IU] via SUBCUTANEOUS

## 2019-04-22 NOTE — Progress Notes (Signed)
STROKE TEAM PROGRESS NOTE   INTERVAL HISTORY Patient RN at bedside.  Patient sitting in bed for breakfast, no neurological deficit.  Smoking cessation education provided, patient is willing to quit.  On aspirin.  Vitals:   04/22/19 0500 04/22/19 0600 04/22/19 0625 04/22/19 0700  BP: 95/62 (!) 111/93  94/69  Pulse: 77 70 76 67  Resp: (!) 21 (!) 24 17 20   Temp:      TempSrc:      SpO2: 99% 98% 96% 100%  Weight:      Height:        CBC:  Recent Labs  Lab 04/19/19 1114 04/20/19 0446  04/21/19 0307 04/21/19 0413  WBC 11.8* 10.2  --   --  13.9*  NEUTROABS 9.8*  --   --   --   --   HGB 11.3* 10.3*   < > 10.9* 11.3*  HCT 37.3* 33.2*   < > 32.0* 34.0*  MCV 91.4 90.0  --   --  87.9  PLT 409* 406*  --   --  276   < > = values in this interval not displayed.    Basic Metabolic Panel:  Recent Labs  Lab 04/20/19 0446  04/21/19 0307 04/21/19 0413  NA 137   < > 136 136  K 3.8   < > 3.7 3.9  CL 103  --   --  104  CO2 23  --   --  22  GLUCOSE 127*  --   --  125*  BUN 13  --   --  15  CREATININE 0.59*  --   --  0.62  CALCIUM 8.9  --   --  8.8*   < > = values in this interval not displayed.   Lipid Panel:     Component Value Date/Time   CHOL 109 04/21/2019 0900   TRIG 101 04/21/2019 0900   HDL 26 (L) 04/21/2019 0900   CHOLHDL 4.2 04/21/2019 0900   VLDL 20 04/21/2019 0900   LDLCALC 63 04/21/2019 0900   HgbA1c:  Lab Results  Component Value Date   HGBA1C 6.0 (H) 04/20/2019   Urine Drug Screen: No results found for: LABOPIA, COCAINSCRNUR, LABBENZ, AMPHETMU, THCU, LABBARB  Alcohol Level No results found for: [place]  Thoracic spine 2 view 6/26-  Fluoroscopy for intraoperative guidance and upper thoracic fusion.   CTA chest wwo contrast 6/24- Large neoplasm arising from the posterior segment right upper lobe with invasion of the adjacent paratracheal region on the right as well as right posterior pleura and bony structures, most notably portions of the right  fourth and fifth ribs posteriorly and T4 and T5 vertebral bodies. Marked collapse of the T4 vertebral body noted. Invasion of the posterior elements at T5 on the right noted. This mass measures 7.2 x 6.2 x 6.9 cm. 3.  Extensive underlying emphysematous change. 4.  Aortopulmonary window region adenopathy.   MRA w/o contrast  6/25- Mild intracranial atherosclerotic disease. No proximal flow-limiting stenosis, occlusion, or dissection.  MR brain wwo contrast 04/20/19- Small subcentimeter acute RIGHT frontal MCA territory infarcts, nonhemorrhagic. 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 Premature for age atrophy. White matter disease disproportionally affects the brainstem, raising the question of chronic microvascular ischemic change. No abnormal intracranial enhancement of the brain or meninges to suggest metastatic disease.  MR thoracic spine wwo contrast 6/25- 7 x 8 cm paravertebral mass centered at T4 on  the RIGHT. Invasion of T3, T4, and T5. Pathologic T4 compression fracture. 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 Neurosurgical consultation is warranted. No compressive disc herniation. Suspected additional 12 mm LEFT T12 metastasis, incompletely evaluated.   US carotid bilateral 6/25- Color duplex indicates minimal heterogeneous plaque, with no hemodynamically significant stenosis by duplex criteria in the extracranial cerebrovascular circulation.  CT chest 0/24- no complication following biopsy 6/26- stable   Transthoracic Echocardiogram  04/21/2019 IMPRESSIONS  1. The left ventricle has normal systolic function, with an ejection fraction of 55-60% 55%. The cavity size was normal. Left ventricular diastolic parameters were normal. No evidence of left ventricular regional wall motion abnormalities.  2. The right ventricle has normal  systolic function. The cavity was normal. There is no increase in right ventricular wall thickness.  3. No evidence of mitral valve stenosis. Trivial mitral regurgitation.  4. The aortic valve is tricuspid. No stenosis of the aortic valve.  5. There is mild dilatation of the aortic root measuring 37 mm.  6. Normal IVC size. PA systolic pressure 16 mmHg.   Bilateral LE Venous Dopplers  04/21/2019 Summary: Right: There is no evidence of deep vein thrombosis in the lower extremity. No cystic structure found in the popliteal fossa. Left: There is no evidence of deep vein thrombosis in the lower extremity. No cystic structure found in the popliteal fossa.  Transcranial Dopplers 04/21/2019 Preliminary Summary: A vascular evaluation was performed. The left middle cerebral artery was studied. An IV was inserted into the patient's left forearm. Verbal informed consent was obtained. HITS heard at rest and during valsalva: Approximately 3 HITS heard. PFO Size: Clinically insignificant.    PHYSICAL EXAM Cachectic appearing  Middle aged 68 malnourished appearing man, resting comfortably, no acute distress RRR CTAB Neurological Exam :  Alert and Oriented x3 normal speech and language. No dysarthria.  Follows all commands.  Extraocular movements are full range without nystagmus.  Blinks to threat bilaterally.  Face is symmetric without weakness.  Tongue midline. 5/5 on BUEs and 3/5 on BLE proximal and 5/5 distal.  No focal weakness.  Sensation decreased below nipple line bilaterally right greater than left Right ankle clonus.  Tone is increased bilateral lower extremities right greater than left.  Deep tendon flexes are brisk in upper extremities and exaggerated in both lower extremities right greater than left.  Both plantars are upgoing.  ASSESSMENT/PLAN [name] is a 58 y.o. 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   Stroke: acute/subacute right frontal scattered small infarcts - etiology unclear atherosclerosis vs. Cancer-related hypercoagulability  MRI- acute and chronic infarctions of R hemisphere  MRA- mld intracranial atherosclerotic disease  Carotid Doppler- no significant stenosis  Echo - EF 55 - 60%. No cardiac source of emboli identified.   Bilateral LE VENOUS US no DVT  TCD w/bubbles no significant right-to-left shunt noted   LDL 63  HgbA1c 6.0  lovenox for VTE prophylaxis  No antithrombotic prior to admission, now on aspirin 325 mg.  Continue aspirin on discharge.  No DAPT at this time given recent surgery and potential biopsy in the near future.  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  Reconstruction with iliac crest allograft strut and fixation with pedicle screws from T2-T6.  Pathology pending   Tobacco abuse  Current smoker  Smoking cessation counseling provided  Nicotine patch provided  Pt is willing to quit  Other Stroke Risk Factors    Other Active Problems  Malnutrition -> Dietician on board, follow recs  Leukocytosis likely due to steroids, WBC -10.2->13.9 - afebrile (pt on Decadron)  Hospital day # 3  Neurology will sign off. Please call with questions. Pt will follow up with stroke clinic Dr. Leonie Man at [facility] in about 4 weeks. Thanks for the consult.  Rosalin Hawking, MD PhD Stroke Neurology 04/22/2019 11:18 AM    To contact Stroke Continuity provider, please refer to [email or web address] After  hours, contact General Neurology

## 2019-04-22 NOTE — Progress Notes (Signed)
NAME:  Paul Thornton, MRN:  [id number], DOB:  05/02/1961, LOS: 3 ADMISSION DATE:  04/19/2019, CONSULTATION DATE:  04/21/2019 REFERRING MD:  Ellene Route, CHIEF COMPLAINT:  Post op vent mgmt   Brief History   Paul Thornton is a 58 y.o. male who was admitted to [facility] 6/24 with cough, weight loss, leg weakness.  He was found to have RUL mass with mets to the ribs and spine.  He underwent CT guided biopsy 6/24 and was taken to the OR overnight 6/25 for spinal debulking.  Post operatively, PCCM was asked to assist with vent management.  History of present illness   Paul Thornton is a 58 y.o. male who has a PMH including but not limited to long standing tobacco dependence, COPD, GERD (see "past medical history" for rest).  He presented to [facility] 6/24 with SOB, cough, weight loss, leg weakness.  He was found to have lung mass with mets to the ribs and spine.   He was evaluated by IR on 6/25 and had CT guided right lung mass of which results are still pending.  MRI of the brain was negative for brain mets but did demonstrate small punctate areas of ischemia.  Due to worsening leg weakness and MRI findings,  He was seen by Dr. Ellene Route of neurosurgery who recommended debulking of the tumor to create separation between it and spinal cord in hopes of avoiding further complications including paralysis.  He was also evaluated by Dr. Leonel Ramsay of neurology for further stroke workup / management.  Overnight 6/25 and into early AM 6/26, he was taken to the OR for laminectomy and decompression of the thoracic spinal canal from T3-T5 with greater than 75% vertebrectomies of T3 and T4, reconstruction with iliac crest allograft strut and fixation with pedicle screws with T2 - T6.  Post operatively, he remained intubated and was transferred to the neuro ICU for ongoing care.  PCCM was asked to assist with vent management.  Past Medical History  Tobacco abuse, COPD  Significant Hospital Events   6/24 > admit. 6/25 >  CT guided lung biopsy 6/26 > to OR for   Consults:  IR. Neurosurgery. Neurology. ENT. PCCM.  Procedures:  ETT 6/26 >  Right radial art line 6/26 >   T3-T5 decompression/tumor debulk/T2-T6 stabilization (Elsner)  Significant Diagnostic Tests:  CXR 6/24 > large RUL mass. CTA chest 6/24 > no PE, large neoplasm arising from posterior segment of RUL with invasion of adjacent paratracheal region on the right as well as posterior pleura and bony structures, most notably right 4-th and 5th ribs posteriorly and T4 and T5 verterbral bodies.  Extensive underlying emphysematous changes. Hepatic steatosis. Carotid US 6/25 > no significant stenosis. MRI T spine 6/25 > 7x8cm paraverterbral mass at T4 with invasion of T3 - T5. Pathologic T4 spinal compression fx.  Suspected additional 32mm left T12 mets. 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  No mets. Echo 6/26 >   Micro Data:  COVID negative (6/24)  Antimicrobials:  periop only   Interim history/subjective:  Awake, interactive, reports minimal incisional pain particularly with movement. L sided strength has not yet returned to normal.  Objective   Blood pressure 94/69, pulse 67, temperature 98.3 F (36.8 C), temperature source Oral, resp. rate 20, height 5\' 9"  (1.753 m), weight 46.8 kg, SpO2 99 %.        Intake/Output Summary (Last 24 hours) at 04/22/2019 0951 Last data filed at 04/22/2019 0700 Gross per  24 hour  Intake 2497.59 ml  Output 2800 ml  Net -302.41 ml   Filed Weights   04/19/19 0957 04/20/19 1826 04/21/19 0219  Weight: 40.8 kg 42.5 kg 46.8 kg    Examination: General: NAD, cachectic  HENT: AT/[place], on room air Lungs: CTAB, signs of hyperinflation. Cardiovascular: RRR no r/m/g Abdomen: SND Extremities: no edema, 2+ pulses. Incision intact. 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 Neuro exam improved, pt makes criteria for extubation.  PLAN:  Post op ventilation.  Weaned well, and has tolerated extubation.  NEURO: Ready to transfer.  Stop iv fluids.  LUNG CA: will need oncologic management once tissue diagnosis finalized.   Best practice:  Diet: NPO for now, will advance to clears and beyond as tolerated Pain/Anxiety/Delirium protocol (if indicated): n/a VAP protocol (if indicated): n/a after extubation DVT prophylaxis: SCDs, acute postop GI prophylaxis: HOB >30, PPI Glucose control: monitor Mobility: bed for now, PT to eval after extubation Code Status: full Family Communication: pt may be able to contact later today Disposition: transfer to floor.  Labs   CBC: Recent Labs  Lab 04/19/19 1114 04/20/19 0446 04/20/19 2314 04/21/19 0018 04/21/19 0307 04/21/19 0413  WBC 11.8* 10.2  --   --   --  13.9*  NEUTROABS 9.8*  --   --   --   --   --   HGB 11.3* 10.3* 8.8* 11.6* 10.9* 11.3*  HCT 37.3* 33.2* 26.0* 34.0* 32.0* 34.0*  MCV 91.4 90.0  --   --   --  87.9  PLT 409* 406*  --   --   --  732    Basic Metabolic Panel: Recent Labs  Lab 04/19/19 1114 04/20/19 0446 04/20/19 2314 04/21/19 0018 04/21/19 0307 04/21/19 0413  NA 141 137 137 136 136 136  K 4.2 3.8 3.8 4.4 3.7 3.9  CL 99 103  --   --   --  104  CO2 27 23  --   --   --  22  GLUCOSE 122* 127*  --   --   --  125*  BUN 11 13  --   --   --  15  CREATININE 0.66 0.59*  --   --   --  0.62  CALCIUM 8.9 8.9  --   --   --  8.8*   GFR: Estimated Creatinine Clearance: 68.3 mL/min (by C-G formula based on SCr of 0.62 mg/dL). Recent Labs  Lab 04/19/19 1114 04/20/19 0446 04/21/19 0413  WBC 11.8* 10.2 13.9*    Liver Function Tests: Recent Labs  Lab 04/19/19 1114  AST 28  ALT 36  ALKPHOS 137*  BILITOT 0.6  PROT 7.2  ALBUMIN 2.5*   No results for input(s): LIPASE, AMYLASE in the last 168  hours. No results for input(s): AMMONIA in the last 168 hours.  ABG    Component Value Date/Time   PHART 7.469 (H) 04/21/2019 0307   PCO2ART 34.6 04/21/2019 0307   PO2ART 187.0 (H) 04/21/2019 0307   HCO3 25.1 04/21/2019 0307   TCO2 26 04/21/2019 0307   O2SAT 100.0 04/21/2019 0307     Coagulation Profile: Recent Labs  Lab 04/20/19 0636  INR 1.0    Cardiac Enzymes: No results for input(s): CKTOTAL, CKMB, CKMBINDEX, TROPONINI in the last 168 hours.  HbA1C: Hgb A1c MFr Bld  Date/Time Value Ref Range Status  04/20/2019 04:46 AM 6.0 (H) 4.8 - 5.6 % Final    Comment:    (NOTE) Pre diabetes:          5.7%-6.4% Diabetes:              >6.4% Glycemic control for   <7.0% adults with diabetes     CBG: No results for input(s): GLUCAP in the last 168 hours.   Kipp Brood, MD [facility] ICU Physician [place]  Pager: [phone] Mobile: [phone] After hours: [phone].

## 2019-04-22 NOTE — Progress Notes (Signed)
Neurosurgery Service Progress Note  Subjective: No acute events overnight, extubated and doing well, reports his strength feels good, numbness is improving   Objective: Vitals:   04/22/19 1400 04/22/19 1500 04/22/19 1600 04/22/19 1957  BP: 98/67 103/67 96/60   Pulse: 66 69 71   Resp: (!) 26 (!) 21 19   Temp:   98.6 F (37 C)   TempSrc:   Oral   SpO2: 99% 99% 96% 95%  Weight:      Height:       Temp (24hrs), Avg:98.6 F (37 C), Min:98.3 F (36.8 C), Max:98.9 F (37.2 C)  CBC Latest Ref Rng & Units 04/21/2019 04/21/2019 04/21/2019  WBC 4.0 - 10.5 K/uL 13.9(H) - -  Hemoglobin 13.0 - 17.0 g/dL 11.3(L) 10.9(L) 11.6(L)  Hematocrit 39.0 - 52.0 % 34.0(L) 32.0(L) 34.0(L)  Platelets 150 - 400 K/uL 276 - -   BMP Latest Ref Rng & Units 04/21/2019 04/21/2019 04/21/2019  Glucose 70 - 99 mg/dL 125(H) - -  BUN 6 - 20 mg/dL 15 - -  Creatinine 0.61 - 1.24 mg/dL 0.62 - -  Sodium 135 - 145 mmol/L 136 136 136  Potassium 3.5 - 5.1 mmol/L 3.9 3.7 4.4  Chloride 98 - 111 mmol/L 104 - -  CO2 22 - 32 mmol/L 22 - -  Calcium 8.9 - 10.3 mg/dL 8.8(L) - -    Intake/Output Summary (Last 24 hours) at 04/22/2019 2020 Last data filed at 04/22/2019 1900 Gross per 24 hour  Intake 3313.57 ml  Output 2500 ml  Net 813.57 ml    Current Facility-Administered Medications:  .  0.9 %  sodium chloride infusion (Manually program via Guardrails IV Fluids), , Intravenous, Once, Kristeen Miss, MD .  0.9 %  sodium chloride infusion, 250 mL, Intravenous, PRN, Kristeen Miss, MD .  0.9 %  sodium chloride infusion, 250 mL, Intravenous, Continuous, Elsner, Henry, MD .  acetaminophen (TYLENOL) tablet 650 mg, 650 mg, Oral, Q4H PRN **OR** acetaminophen (TYLENOL) suppository 650 mg, 650 mg, Rectal, Q4H PRN, Kristeen Miss, MD .  albuterol (PROVENTIL) (2.5 MG/3ML) 0.083% nebulizer solution 2.5 mg, 2.5 mg, Nebulization, Q3H PRN, Kristeen Miss, MD .  alum & mag hydroxide-simeth (MAALOX/MYLANTA) 200-200-20 MG/5ML suspension 30 mL, 30  mL, Oral, Q6H PRN, Kristeen Miss, MD .  Derrill Memo ON 04/23/2019] aspirin EC tablet 325 mg, 325 mg, Oral, Daily, Rosalin Hawking, MD .  bisacodyl (DULCOLAX) suppository 10 mg, 10 mg, Rectal, Daily PRN, Kristeen Miss, MD .  budesonide (PULMICORT) nebulizer solution 0.5 mg, 0.5 mg, Nebulization, BID, Kristeen Miss, MD, 0.5 mg at 04/22/19 1957 .  Chlorhexidine Gluconate Cloth 2 % PADS 6 each, 6 each, Topical, Daily, Desai, Rahul P, PA-C, 6 each at 04/22/19 1557 .  dexamethasone (DECADRON) injection 4 mg, 4 mg, Intravenous, Q6H, 4 mg at 04/22/19 1710 **OR** dexamethasone (DECADRON) tablet 4 mg, 4 mg, Oral, Q6H, Kristeen Miss, MD, 4 mg at 04/22/19 4696 .  docusate sodium (COLACE) capsule 100 mg, 100 mg, Oral, BID, Kristeen Miss, MD, 100 mg at 04/22/19 0949 .  doxycycline (VIBRAMYCIN) 100 mg in sodium chloride 0.9 % 250 mL IVPB, 100 mg, Intravenous, Q12H, Desai, Rahul P, PA-C, Last Rate: 125 mL/hr at 04/22/19 1900 .  fentaNYL (SUBLIMAZE) bolus via infusion 50 mcg, 50 mcg, Intravenous, Q15 min PRN, Desai, Rahul P, PA-C .  fentaNYL 2533mcg in NS 251mL (38mcg/ml) infusion-PREMIX, 50-200 mcg/hr, Intravenous, Continuous, Desai, Rahul P, PA-C, Stopped at 04/21/19 0846 .  heparin injection 5,000 Units, 5,000 Units, Subcutaneous, Q8H, Kristeen Miss,  MD, 5,000 Units at 04/22/19 1304 .  HYDROcodone-acetaminophen (NORCO/VICODIN) 5-325 MG per tablet 1-2 tablet, 1-2 tablet, Oral, Q3H PRN, Kristeen Miss, MD, 2 tablet at 04/22/19 1511 .  insulin aspart (novoLOG) injection 1-3 Units, 1-3 Units, Subcutaneous, Q4H, Agarwala, Ravi, MD, 2 Units at 04/22/19 1245 .  ipratropium-albuterol (DUONEB) 0.5-2.5 (3) MG/3ML nebulizer solution 3 mL, 3 mL, Nebulization, Q6H PRN, Kristeen Miss, MD, 3 mL at 04/21/19 1949 .  lactated ringers infusion, , Intravenous, Continuous, Kristeen Miss, MD, Stopped at 04/22/19 1709 .  menthol-cetylpyridinium (CEPACOL) lozenge 3 mg, 1 lozenge, Oral, PRN **OR** phenol (CHLORASEPTIC) mouth spray 1 spray, 1 spray,  Mouth/Throat, PRN, Kristeen Miss, MD .  midazolam (VERSED) injection 2 mg, 2 mg, Intravenous, Q15 min PRN, Desai, Rahul P, PA-C .  midazolam (VERSED) injection 2 mg, 2 mg, Intravenous, Q2H PRN, Desai, Rahul P, PA-C .  morphine 2 MG/ML injection 2-4 mg, 2-4 mg, Intravenous, Q2H PRN, Kristeen Miss, MD, 2 mg at 04/22/19 1647 .  nicotine (NICODERM CQ - dosed in mg/24 hours) patch 14 mg, 14 mg, Transdermal, Daily, Kristeen Miss, MD, 14 mg at 04/22/19 0906 .  ondansetron (ZOFRAN) tablet 4 mg, 4 mg, Oral, Q6H PRN **OR** ondansetron (ZOFRAN) injection 4 mg, 4 mg, Intravenous, Q6H PRN, Kristeen Miss, MD .  pantoprazole (PROTONIX) EC tablet 40 mg, 40 mg, Oral, Daily, Kristeen Miss, MD, 40 mg at 04/22/19 0949 .  polyethylene glycol (MIRALAX / GLYCOLAX) packet 17 g, 17 g, Oral, Daily PRN, Kristeen Miss, MD .  senna (SENOKOT) tablet 8.6 mg, 1 tablet, Oral, BID, Kristeen Miss, MD, 8.6 mg at 04/22/19 0949 .  sodium chloride flush (NS) 0.9 % injection 3 mL, 3 mL, Intravenous, Q12H, Kristeen Miss, MD, 3 mL at 04/22/19 0950 .  sodium chloride flush (NS) 0.9 % injection 3 mL, 3 mL, Intravenous, PRN, Kristeen Miss, MD .  sodium chloride flush (NS) 0.9 % injection 3 mL, 3 mL, Intravenous, Q12H, Kristeen Miss, MD, 3 mL at 04/22/19 0950 .  sodium chloride flush (NS) 0.9 % injection 3 mL, 3 mL, Intravenous, PRN, Kristeen Miss, MD .  sodium phosphate (FLEET) 7-19 GM/118ML enema 1 enema, 1 enema, Rectal, Once PRN, Kristeen Miss, MD   Physical Exam: AOx3, PERRL, EOMI, FS, Strength diffusely 4+/5 in BLE, thoracic sensory level but improving Incision c/d/i, drain w/ minimal serosang output  Assessment & Plan: 58 y.o. 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  -d/c drain -continue PT/OT  Judith Part  04/22/19 8:20 PM

## 2019-04-23 LAB — GLUCOSE, CAPILLARY
Glucose-Capillary: 100 mg/dL — ABNORMAL HIGH (ref 70–99)
Glucose-Capillary: 113 mg/dL — ABNORMAL HIGH (ref 70–99)
Glucose-Capillary: 109 mg/dL — ABNORMAL HIGH (ref 70–99)
Glucose-Capillary: 121 mg/dL — ABNORMAL HIGH (ref 70–99)
Glucose-Capillary: 147 mg/dL — ABNORMAL HIGH (ref 70–99)

## 2019-04-23 MED ORDER — POLYVINYL ALCOHOL 1.4 % OP SOLN
1.0000 [drp] | OPHTHALMIC | Status: DC | PRN
  Filled 2019-04-23: qty 15

## 2019-04-23 MED ORDER — DOXYCYCLINE HYCLATE 100 MG PO TABS
100.0000 mg | ORAL_TABLET | Freq: Two times a day (BID) | ORAL | Status: DC
  Administered 2019-04-23 – 2019-04-26 (×6): 100 mg via ORAL
  Filled 2019-04-23 (×6): qty 1

## 2019-04-23 NOTE — NC FL2 (Addendum)
[place] LEVEL OF CARE SCREENING TOOL     IDENTIFICATION  Patient Name: Paul Thornton Birthdate: 07-Dec-1960 Sex: male Admission Date (Current Location): 04/19/2019  [facility] and [place] Number:  Herbalist and Address:  [facility] [place], [place] [address], [place], [place] [address]      Provider Number: [id number]  Attending Physician Name and Address:  Kristeen Miss, MD  Relative Name and Phone Number:  Nazier Neyhart, Mother, [phone] & [name], Sister, [phone]    Current Level of Care: Hospital Recommended Level of Care: [place] Prior Approval Number:    Date Approved/Denied:   PASRR Number:   [id number]  Discharge Plan: SNF    Current Diagnoses: Patient Active Problem List   Diagnosis Date Noted  . Cerebral embolism with cerebral infarction 04/21/2019  . 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 COPD exacerbation ([place]) 04/19/2019  . Tobacco abuse 04/19/2019  . Mass of right lung 04/19/2019  . GERD (gastroesophageal reflux disease) 04/19/2019  . Bronchitis 04/19/2019    Orientation RESPIRATION BLADDER Height & Weight     Self, Time, Situation, Place  Normal Continent, External catheter Weight: 103 lb 2.8 oz (46.8 kg) Height:  5\' 9"  (175.3 cm)  BEHAVIORAL SYMPTOMS/MOOD NEUROLOGICAL BOWEL NUTRITION STATUS      Continent Diet(Regular diet, thin liquids)  AMBULATORY STATUS COMMUNICATION OF NEEDS Skin   Limited Assist Verbally Surgical wounds(closed incision on back (dressing in place) & puncture wound on veterbral column (bandage in place))                       Personal Care Assistance Level of Assistance  Bathing, Feeding, Dressing, Total care Bathing Assistance: Limited assistance Feeding assistance: Independent Dressing Assistance: Maximum assistance Total Care Assistance: Limited assistance   Functional  Limitations Info  Sight, Hearing, Speech Sight Info: Adequate(Wears glasses) Hearing Info: Adequate Speech Info: Adequate    SPECIAL CARE FACTORS FREQUENCY  PT (By licensed PT), OT (By licensed OT)     PT Frequency: 5x/wk OT Frequency: 5x/wk            Contractures Contractures Info: Not present    Additional Factors Info  Code Status, Allergies, Insulin Sliding Scale Code Status Info: Full Code Allergies Info: Amoxicillin, Penicillins   Insulin Sliding Scale Info: insulin aspart novolog 1-3 units every 4 hours       Current Medications (04/23/2019):  This is the current hospital active medication list Current Facility-Administered Medications  Medication Dose Route Frequency Provider Last Rate Last Dose  . 0.9 %  sodium chloride infusion (Manually program via Guardrails IV Fluids)   Intravenous Once Kristeen Miss, MD      . 0.9 %  sodium chloride infusion  250 mL Intravenous PRN Kristeen Miss, MD      . 0.9 %  sodium chloride infusion  250 mL Intravenous Continuous Kristeen Miss, MD      . acetaminophen (TYLENOL) tablet 650 mg  650 mg Oral Q4H PRN Kristeen Miss, MD       Or  . acetaminophen (TYLENOL) suppository 650 mg  650 mg Rectal Q4H PRN Kristeen Miss, MD      . albuterol (PROVENTIL) (2.5 MG/3ML) 0.083% nebulizer solution 2.5 mg  2.5 mg Nebulization Q3H PRN Kristeen Miss, MD      . alum & mag hydroxide-simeth (MAALOX/MYLANTA) 200-200-20 MG/5ML suspension 30 mL  30 mL Oral Q6H PRN Elsner,  Mallie Mussel, MD      . aspirin EC tablet 325 mg  325 mg Oral Daily Rosalin Hawking, MD   325 mg at 04/23/19 0954  . bisacodyl (DULCOLAX) suppository 10 mg  10 mg Rectal Daily PRN Kristeen Miss, MD      . budesonide (PULMICORT) nebulizer solution 0.5 mg  0.5 mg Nebulization BID Kristeen Miss, MD   0.5 mg at 04/23/19 0756  . dexamethasone (DECADRON) injection 4 mg  4 mg Intravenous Q6H Kristeen Miss, MD   4 mg at 04/22/19 1710   Or  . dexamethasone (DECADRON) tablet 4 mg  4 mg Oral Q6H Kristeen Miss, MD   4 mg at 04/23/19 0524  . docusate sodium (COLACE) capsule 100 mg  100 mg Oral BID Kristeen Miss, MD   100 mg at 04/23/19 2542  . doxycycline (VIBRA-TABS) tablet 100 mg  100 mg Oral Q12H Kristeen Miss, MD      . fentaNYL (SUBLIMAZE) bolus via infusion 50 mcg  50 mcg Intravenous Q15 min PRN Desai, Rahul P, PA-C      . heparin injection 5,000 Units  5,000 Units Subcutaneous Loren Racer, MD   5,000 Units at 04/23/19 0524  . HYDROcodone-acetaminophen (NORCO/VICODIN) 5-325 MG per tablet 1-2 tablet  1-2 tablet Oral Q3H PRN Kristeen Miss, MD   2 tablet at 04/23/19 0951  . insulin aspart (novoLOG) injection 1-3 Units  1-3 Units Subcutaneous Q4H Kipp Brood, MD   1 Units at 04/22/19 2030  . ipratropium-albuterol (DUONEB) 0.5-2.5 (3) MG/3ML nebulizer solution 3 mL  3 mL Nebulization Q6H PRN Kristeen Miss, MD   3 mL at 04/21/19 1949  . lactated ringers infusion   Intravenous Continuous Kristeen Miss, MD   Stopped at 04/23/19 [phone]  . menthol-cetylpyridinium (CEPACOL) lozenge 3 mg  1 lozenge Oral PRN Kristeen Miss, MD       Or  . phenol (CHLORASEPTIC) mouth spray 1 spray  1 spray Mouth/Throat PRN Kristeen Miss, MD      . midazolam (VERSED) injection 2 mg  2 mg Intravenous Q15 min PRN Desai, Rahul P, PA-C      . midazolam (VERSED) injection 2 mg  2 mg Intravenous Q2H PRN Desai, Rahul P, PA-C      . morphine 2 MG/ML injection 2-4 mg  2-4 mg Intravenous Q2H PRN Kristeen Miss, MD   2 mg at 04/22/19 1647  . nicotine (NICODERM CQ - dosed in mg/24 hours) patch 14 mg  14 mg Transdermal Daily Kristeen Miss, MD   14 mg at 04/23/19 0953  . ondansetron (ZOFRAN) tablet 4 mg  4 mg Oral Q6H PRN Kristeen Miss, MD       Or  . ondansetron (ZOFRAN) injection 4 mg  4 mg Intravenous Q6H PRN Kristeen Miss, MD      . pantoprazole (PROTONIX) EC tablet 40 mg  40 mg Oral Daily Kristeen Miss, MD   40 mg at 04/23/19 3762  . polyethylene glycol (MIRALAX / GLYCOLAX) packet 17 g  17 g Oral Daily PRN Kristeen Miss, MD       . polyvinyl alcohol (LIQUIFILM TEARS) 1.4 % ophthalmic solution 1 drop  1 drop Both Eyes PRN Judith Part, MD      . senna (SENOKOT) tablet 8.6 mg  1 tablet Oral BID Kristeen Miss, MD   8.6 mg at 04/23/19 0952  . sodium chloride flush (NS) 0.9 % injection 3 mL  3 mL Intravenous Austin Miles, MD   3 mL at 04/23/19  9390  . sodium chloride flush (NS) 0.9 % injection 3 mL  3 mL Intravenous PRN Kristeen Miss, MD      . sodium chloride flush (NS) 0.9 % injection 3 mL  3 mL Intravenous Q12H Kristeen Miss, MD   3 mL at 04/23/19 0955  . sodium chloride flush (NS) 0.9 % injection 3 mL  3 mL Intravenous PRN Kristeen Miss, MD      . sodium phosphate (FLEET) 7-19 GM/118ML enema 1 enema  1 enema Rectal Once PRN Kristeen Miss, MD         Discharge Medications: Please see discharge summary for a list of discharge medications.  Relevant Imaging Results:  Relevant Lab Results:   Additional Information SSN: [id number], Will be an LOG, pending medicaid  Caitlin B Pinion, LCSWA

## 2019-04-23 NOTE — TOC Initial Note (Signed)
Transition of Care [facility]) - Initial/Assessment Note    Patient Details  Name: Paul Thornton MRN: [id number] Date of Birth: 15-May-1961  Transition of Care [facility]) CM/SW Contact:    Oretha Milch, LCSW Phone Number: 04/23/2019, 10:17 AM  Clinical Narrative:  CSW spoke with patient regarding post-hospitalization plan. CSW noted patient wanted to pursue an SNF per PT recommendations. CSW noted patient does not have insurance and may likely be a difficult placement that may require an LOG. CSW discussed the SNF process and noted patient had no questions or concerns at this time.   Expected Discharge Plan: Skilled Nursing Facility Barriers to Discharge: Inadequate or no insurance   Patient Goals and CMS Choice Patient states their goals for this hospitalization and ongoing recovery are:: "Get back home." CMS Medicare.gov Compare Post Acute Care list provided to:: Patient Choice offered to / list presented to : Patient  Expected Discharge Plan and Services Expected Discharge Plan: [place]                                              Prior Living Arrangements/Services   Lives with:: Parents   Do you feel safe going back to the place where you live?: Yes      Need for Family Participation in Patient Care: No (Comment) Care giver support system in place?: No (comment)   Criminal Activity/Legal Involvement Pertinent to Current Situation/Hospitalization: No - Comment as needed  Activities of Daily Living Home Assistive Devices/Equipment: None ADL Screening (condition at time of admission) Patient's cognitive ability adequate to safely complete daily activities?: Yes Is the patient deaf or have difficulty hearing?: No Does the patient have difficulty seeing, even when wearing glasses/contacts?: No Does the patient have difficulty concentrating, remembering, or making decisions?: No Patient able to express need for assistance with ADLs?: Yes Does the  patient have difficulty dressing or bathing?: Yes Independently performs ADLs?: No Communication: Needs assistance Is this a change from baseline?: Change from baseline, expected to last <3 days Dressing (OT): Needs assistance Is this a change from baseline?: Change from baseline, expected to last <3days Grooming: Needs assistance Is this a change from baseline?: Change from baseline, expected to last <3 days Feeding: Independent Bathing: Needs assistance Is this a change from baseline?: Change from baseline, expected to last <3 days Toileting: Needs assistance Is this a change from baseline?: Change from baseline, expected to last <3 days In/Out Bed: Needs assistance Is this a change from baseline?: Change from baseline, expected to last <3 days Walks in Home: Needs assistance Is this a change from baseline?: Change from baseline, expected to last <3 days Does the patient have difficulty walking or climbing stairs?: Yes Weakness of Legs: Both Weakness of Arms/Hands: None  Permission Sought/Granted Permission sought to share information with : Chartered certified accountant granted to share information with : Yes, Verbal Permission Granted  Share Information with NAME: Facility contacts           Emotional Assessment Appearance:: Appears older than stated age     Orientation: : Oriented to Self, Oriented to Place, Oriented to  Time, Oriented to Situation Alcohol / Substance Use: Not Applicable Psych Involvement: No (comment)  Admission diagnosis:  Tobacco abuse [Z72.0] SOB (shortness of breath) [R06.02] Acute exacerbation of chronic obstructive pulmonary disease (COPD) (HCC) [J44.1] Acute dyspnea [R06.00] Mass of right lung [  R91.8] Patient Active Problem List   Diagnosis Date Noted  . Cerebral embolism with cerebral infarction 04/21/2019  . 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 COPD  exacerbation ([place]) 04/19/2019  . Tobacco abuse 04/19/2019  . Mass of right lung 04/19/2019  . GERD (gastroesophageal reflux disease) 04/19/2019  . Bronchitis 04/19/2019   PCP:  Patient, No Pcp Per Pharmacy:   [facility] [phone] - [place], [place] [address] [place] [address] Phone: [phone] Fax: [phone]     Social Determinants of Health (SDOH) Interventions    Readmission Risk Interventions No flowsheet data found.

## 2019-04-23 NOTE — Progress Notes (Signed)
Rehab Admissions Coordinator Note:  Patient was screened by Michel Santee for appropriateness for an Inpatient Acute Rehab Consult.  At this time, we are recommending Inpatient Rehab consult. Please place IP Rehab MD consult order.   Michel Santee 04/23/2019, 8:58 PM  I can be reached at [phone].

## 2019-04-23 NOTE — Progress Notes (Signed)
Neurosurgery Service Progress Note  Subjective: No acute events overnight, no new complaints except some eye dryness  Objective: Vitals:   04/23/19 0500 04/23/19 0600 04/23/19 0700 04/23/19 0800  BP: 109/68 107/75 104/79 114/73  Pulse:      Resp: 16 16 19 11   Temp:    98.3 F (36.8 C)  TempSrc:    Oral  SpO2:    98%  Weight:      Height:       Temp (24hrs), Avg:98.5 F (36.9 C), Min:98.3 F (36.8 C), Max:98.7 F (37.1 C)  CBC Latest Ref Rng & Units 04/21/2019 04/21/2019 04/21/2019  WBC 4.0 - 10.5 K/uL 13.9(H) - -  Hemoglobin 13.0 - 17.0 g/dL 11.3(L) 10.9(L) 11.6(L)  Hematocrit 39.0 - 52.0 % 34.0(L) 32.0(L) 34.0(L)  Platelets 150 - 400 K/uL 276 - -   BMP Latest Ref Rng & Units 04/21/2019 04/21/2019 04/21/2019  Glucose 70 - 99 mg/dL 125(H) - -  BUN 6 - 20 mg/dL 15 - -  Creatinine 0.61 - 1.24 mg/dL 0.62 - -  Sodium 135 - 145 mmol/L 136 136 136  Potassium 3.5 - 5.1 mmol/L 3.9 3.7 4.4  Chloride 98 - 111 mmol/L 104 - -  CO2 22 - 32 mmol/L 22 - -  Calcium 8.9 - 10.3 mg/dL 8.8(L) - -    Intake/Output Summary (Last 24 hours) at 04/23/2019 0901 Last data filed at 04/23/2019 0800 Gross per 24 hour  Intake 1347.88 ml  Output 2250 ml  Net -902.12 ml    Current Facility-Administered Medications:  .  0.9 %  sodium chloride infusion (Manually program via Guardrails IV Fluids), , Intravenous, Once, Kristeen Miss, MD .  0.9 %  sodium chloride infusion, 250 mL, Intravenous, PRN, Kristeen Miss, MD .  0.9 %  sodium chloride infusion, 250 mL, Intravenous, Continuous, Elsner, Henry, MD .  acetaminophen (TYLENOL) tablet 650 mg, 650 mg, Oral, Q4H PRN **OR** acetaminophen (TYLENOL) suppository 650 mg, 650 mg, Rectal, Q4H PRN, Kristeen Miss, MD .  albuterol (PROVENTIL) (2.5 MG/3ML) 0.083% nebulizer solution 2.5 mg, 2.5 mg, Nebulization, Q3H PRN, Kristeen Miss, MD .  alum & mag hydroxide-simeth (MAALOX/MYLANTA) 200-200-20 MG/5ML suspension 30 mL, 30 mL, Oral, Q6H PRN, Kristeen Miss, MD .  aspirin  EC tablet 325 mg, 325 mg, Oral, Daily, Rosalin Hawking, MD .  bisacodyl (DULCOLAX) suppository 10 mg, 10 mg, Rectal, Daily PRN, Kristeen Miss, MD .  budesonide (PULMICORT) nebulizer solution 0.5 mg, 0.5 mg, Nebulization, BID, Kristeen Miss, MD, 0.5 mg at 04/23/19 0756 .  Chlorhexidine Gluconate Cloth 2 % PADS 6 each, 6 each, Topical, Daily, Desai, Rahul P, PA-C, 6 each at 04/22/19 1557 .  dexamethasone (DECADRON) injection 4 mg, 4 mg, Intravenous, Q6H, 4 mg at 04/22/19 1710 **OR** dexamethasone (DECADRON) tablet 4 mg, 4 mg, Oral, Q6H, Kristeen Miss, MD, 4 mg at 04/23/19 0524 .  docusate sodium (COLACE) capsule 100 mg, 100 mg, Oral, BID, Kristeen Miss, MD, 100 mg at 04/22/19 2149 .  doxycycline (VIBRAMYCIN) 100 mg in sodium chloride 0.9 % 250 mL IVPB, 100 mg, Intravenous, Q12H, Desai, Rahul P, PA-C, Stopped at 04/23/19 0729 .  fentaNYL (SUBLIMAZE) bolus via infusion 50 mcg, 50 mcg, Intravenous, Q15 min PRN, Desai, Rahul P, PA-C .  fentaNYL 2528mcg in NS 245mL (25mcg/ml) infusion-PREMIX, 50-200 mcg/hr, Intravenous, Continuous, Desai, Rahul P, PA-C, Stopped at 04/21/19 0846 .  heparin injection 5,000 Units, 5,000 Units, Subcutaneous, Q8H, Kristeen Miss, MD, 5,000 Units at 04/23/19 0524 .  HYDROcodone-acetaminophen (NORCO/VICODIN) 5-325 MG per tablet  1-2 tablet, 1-2 tablet, Oral, Q3H PRN, Kristeen Miss, MD, 2 tablet at 04/23/19 0650 .  insulin aspart (novoLOG) injection 1-3 Units, 1-3 Units, Subcutaneous, Q4H, Agarwala, Ravi, MD, 1 Units at 04/22/19 2030 .  ipratropium-albuterol (DUONEB) 0.5-2.5 (3) MG/3ML nebulizer solution 3 mL, 3 mL, Nebulization, Q6H PRN, Kristeen Miss, MD, 3 mL at 04/21/19 1949 .  lactated ringers infusion, , Intravenous, Continuous, Kristeen Miss, MD, Stopped at 04/23/19 [phone] .  menthol-cetylpyridinium (CEPACOL) lozenge 3 mg, 1 lozenge, Oral, PRN **OR** phenol (CHLORASEPTIC) mouth spray 1 spray, 1 spray, Mouth/Throat, PRN, Kristeen Miss, MD .  midazolam (VERSED) injection 2 mg, 2 mg,  Intravenous, Q15 min PRN, Desai, Rahul P, PA-C .  midazolam (VERSED) injection 2 mg, 2 mg, Intravenous, Q2H PRN, Desai, Rahul P, PA-C .  morphine 2 MG/ML injection 2-4 mg, 2-4 mg, Intravenous, Q2H PRN, Kristeen Miss, MD, 2 mg at 04/22/19 1647 .  nicotine (NICODERM CQ - dosed in mg/24 hours) patch 14 mg, 14 mg, Transdermal, Daily, Kristeen Miss, MD, 14 mg at 04/22/19 0906 .  ondansetron (ZOFRAN) tablet 4 mg, 4 mg, Oral, Q6H PRN **OR** ondansetron (ZOFRAN) injection 4 mg, 4 mg, Intravenous, Q6H PRN, Kristeen Miss, MD .  pantoprazole (PROTONIX) EC tablet 40 mg, 40 mg, Oral, Daily, Kristeen Miss, MD, 40 mg at 04/22/19 0949 .  polyethylene glycol (MIRALAX / GLYCOLAX) packet 17 g, 17 g, Oral, Daily PRN, Kristeen Miss, MD .  senna (SENOKOT) tablet 8.6 mg, 1 tablet, Oral, BID, Kristeen Miss, MD, 8.6 mg at 04/22/19 2149 .  sodium chloride flush (NS) 0.9 % injection 3 mL, 3 mL, Intravenous, Q12H, Kristeen Miss, MD, 3 mL at 04/22/19 0950 .  sodium chloride flush (NS) 0.9 % injection 3 mL, 3 mL, Intravenous, PRN, Kristeen Miss, MD .  sodium chloride flush (NS) 0.9 % injection 3 mL, 3 mL, Intravenous, Q12H, Kristeen Miss, MD, 3 mL at 04/22/19 2150 .  sodium chloride flush (NS) 0.9 % injection 3 mL, 3 mL, Intravenous, PRN, Kristeen Miss, MD .  sodium phosphate (FLEET) 7-19 GM/118ML enema 1 enema, 1 enema, Rectal, Once PRN, Kristeen Miss, MD   Physical Exam: AOx3, PERRL, EOMI, FS, Strength diffusely 4+/5 in BLE, thoracic sensory level but improving Incision c/d/i  Assessment & Plan: 58 y.o. 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  -continue PT/OT, CIR pending -lubricating eye gtt OU prn  Judith Part  04/23/19 9:01 AM

## 2019-04-23 NOTE — Progress Notes (Signed)
Physical Therapy Treatment Patient Details Name: Paul Thornton MRN: [id number] DOB: [birth year] Today's Date: 04/23/2019    History of Present Illness This 58 y.o. male admitted with progressive back and chest wall pain as well as LE weakness and falls.  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   He underwent resection of tumor, laminectomy and decompression of T3-5 with fusion from T2-6.  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  No cranial metastatic dissease seen.  PMH includes COPD     PT Comments    Continuing work on functional mobility and activity tolerance;  Able to walk further today; still with weakness and a tendnecy for L knee to hyperextend into genu recurvatum for more stability in stance -- long term this will lead to early wear and tear on the knee; REcommend further rehabilitation post-acutely; have placed IP rehab screen  Follow Up Recommendations  CIR     Equipment Recommendations  Rolling walker with 5" wheels;3in1 (PT)    Recommendations for Other Services Rehab consult     Precautions / Restrictions Precautions Precautions: Fall Precaution Comments: back precautions  Required Braces or Orthoses: Spinal Brace Spinal Brace: Thoracolumbosacral orthotic;Applied in sitting position Restrictions Weight Bearing Restrictions: No    Mobility  Bed Mobility Overal bed mobility: Needs Assistance Bed Mobility: Rolling;Sidelying to Sit Rolling: Min assist;+2 for safety/equipment         General bed mobility comments: Min assist to roll to right, and come up to sitting EOB.  Support needed at trunk during transition, pt managing legs on his own.   Transfers Overall transfer level: Needs assistance Equipment used: Rolling walker (2  wheeled) Transfers: Sit to/from Stand Sit to Stand: Mod assist         General transfer comment: Light mod assist to power up  Ambulation/Gait Ambulation/Gait assistance: Mod assist Gait Distance (Feet): 40 Feet Assistive device: Rolling walker (2 wheeled) Gait Pattern/deviations: Decreased step length - right;Decreased step length - left;Decreased stance time - left;Decreased stride length;Scissoring;Steppage;Decreased dorsiflexion - left Gait velocity: decreased   General Gait Details: Very unsteady, with tendency to scissor; noting also tendency for L knee hyperextension in stance; decr hellstrike bilaterally, L worse than R   Stairs             Wheelchair Mobility    Modified Rankin (Stroke Patients Only) Modified Rankin (Stroke Patients Only) Pre-Morbid Rankin Score: No symptoms Modified Rankin: Moderately severe disability     Balance Overall balance assessment: Needs assistance Sitting-balance support: Feet supported;Bilateral upper extremity supported Sitting balance-Leahy Scale: Fair Sitting balance - Comments: supervision EOB, difficulty due to pain with weight shifting.    Standing balance support: Bilateral upper extremity supported Standing balance-Leahy Scale: Poor Standing balance comment: needs external support in standing.                             Cognition Arousal/Alertness: Awake/alert Behavior During Therapy: WFL for tasks assessed/performed Overall Cognitive Status: Within Functional Limits for tasks assessed(for simple mobility tasks)  Exercises      General Comments General comments (skin integrity, edema, etc.): VSS      Pertinent Vitals/Pain Pain Assessment: 0-10 Pain Score: 5  Pain Location: mid back shoulders Pain Descriptors / Indicators: Guarding;Grimacing Pain Intervention(s): Monitored during session    Home Living                      Prior  Function            PT Goals (current goals can now be found in the care plan section) Acute Rehab PT Goals Patient Stated Goal: to go fishing again PT Goal Formulation: With patient Time For Goal Achievement: 05/05/19 Potential to Achieve Goals: Good Progress towards PT goals: Progressing toward goals    Frequency    Min 5X/week      PT Plan Current plan remains appropriate    Co-evaluation              AM-PAC PT "6 Clicks" Mobility   Outcome Measure  Help needed turning from your back to your side while in a flat bed without using bedrails?: A Little Help needed moving from lying on your back to sitting on the side of a flat bed without using bedrails?: A Little Help needed moving to and from a bed to a chair (including a wheelchair)?: A Lot Help needed standing up from a chair using your arms (e.g., wheelchair or bedside chair)?: A Lot Help needed to walk in hospital room?: A Lot Help needed climbing 3-5 steps with a railing? : Total 6 Click Score: 13    End of Session Equipment Utilized During Treatment: Back brace Activity Tolerance: Patient tolerated treatment well Patient left: in chair;with call bell/phone within reach;Other (comment)(in Wheelchair, preparing to transfer rooms)   PT Visit Diagnosis: Muscle weakness (generalized) (M62.81);Difficulty in walking, not elsewhere classified (R26.2);Pain;Other symptoms and signs involving the nervous system (R29.898) Pain - Right/Left: (central) Pain - part of body: (mid back)     Time: 2395-3202 PT Time Calculation (min) (ACUTE ONLY): 17 min  Charges:  $Gait Training: 8-22 mins                  Roney Marion, PT  Acute Rehabilitation Services Pager [phone] Office [place] 04/23/2019, 3:57 PM

## 2019-04-23 NOTE — Progress Notes (Signed)
CSW called and spoke with Surveyor, quantity, Nathaniel Man about the patient. Before Edwyna Ready will approve an LOG, the patient will need to be evaluated by PT again to see if he is able to improve. Zack wanted to know if the patient would be able to private pay, CSW will email financial counselor about the patient to start the medicaid application. Edwyna Ready stated that he may approve an LOG for home health if the patient is able to progress.   Zack stated he will revisit an LOG if we can get more information.   Domenic Schwab, MSW, [place]

## 2019-04-24 LAB — TYPE AND SCREEN
ABO/RH(D): A POS
Antibody Screen: NEGATIVE
Unit division: 0
Unit division: 0
Unit division: 0
Unit division: 0
Unit division: 0
Unit division: 0
Unit division: 0
Unit division: 0

## 2019-04-24 LAB — GLUCOSE, CAPILLARY
Glucose-Capillary: 101 mg/dL — ABNORMAL HIGH (ref 70–99)
Glucose-Capillary: 107 mg/dL — ABNORMAL HIGH (ref 70–99)
Glucose-Capillary: 110 mg/dL — ABNORMAL HIGH (ref 70–99)
Glucose-Capillary: 111 mg/dL — ABNORMAL HIGH (ref 70–99)
Glucose-Capillary: 120 mg/dL — ABNORMAL HIGH (ref 70–99)
Glucose-Capillary: 88 mg/dL (ref 70–99)

## 2019-04-24 LAB — BPAM RBC
Blood Product Expiration Date: 202007232359
Blood Product Expiration Date: 202007242359
Blood Product Expiration Date: 202007242359
Blood Product Expiration Date: 202007242359
Blood Product Expiration Date: 202007242359
Blood Product Expiration Date: 202007252359
Blood Product Expiration Date: 202007262359
Blood Product Expiration Date: 202007262359
ISSUE DATE / TIME: 202006252206
ISSUE DATE / TIME: 202006252206
ISSUE DATE / TIME: 202006252206
ISSUE DATE / TIME: 202006262030
Unit Type and Rh: 6200
Unit Type and Rh: 6200
Unit Type and Rh: 6200
Unit Type and Rh: 6200
Unit Type and Rh: 6200
Unit Type and Rh: 6200
Unit Type and Rh: 6200
Unit Type and Rh: 6200

## 2019-04-24 MED ORDER — STROKE: EARLY STAGES OF RECOVERY BOOK
Freq: Once | Status: AC
  Administered 2019-04-24: 20:00:00
  Filled 2019-04-24: qty 1

## 2019-04-24 NOTE — Progress Notes (Signed)
PT Cancellation Note  Patient Details Name: Paul Thornton MRN: [id number] DOB: 09-17-1961   Cancelled Treatment:    Reason Eval/Treat Not Completed: Patient not medically ready. Upon PT arrival pt writhing in pain in abdomen due to not having a bowel movement. Pt in tears stating "I need some help." offered to assist pt to the bathroom however patient states "I tried, I can't go." "I can't even get out of the bed right now". Notified RN who reports she will call the MD. Acute PT to return as able.  Kittie Plater, PT, DPT Acute Rehabilitation Services Pager #: [phone] Office #: [phone]   Berline Lopes 04/24/2019, 9:53 AM

## 2019-04-24 NOTE — Progress Notes (Signed)
Occupational Therapy Treatment Patient Details Name: Paul Thornton MRN: [id number] DOB: 1961-07-26 Today's Date: 04/24/2019    History of present illness This 58 y.o. male admitted with progressive back and chest wall pain as well as LE weakness and falls.  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   He underwent resection of tumor, laminectomy and decompression of T3-5 with fusion from T2-6.  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  No cranial metastatic dissease seen.  PMH includes COPD    OT comments  Focus of session on LB dressing in bed with HOB up, bed mobility, transfer to [facility] and seated grooming. Pt concerned about need to have a BM, but cooperative for activities. Reinforced back precautions and wearing schedule for TLSO.  Follow Up Recommendations  CIR;Supervision/Assistance - 24 hour    Equipment Recommendations  Other (comment)(defer to next venue)    Recommendations for Other Services      Precautions / Restrictions Precautions Precautions: Fall;Back Precaution Comments: cues for back precautions with bed mobility Required Braces or Orthoses: Spinal Brace Spinal Brace: Thoracolumbosacral orthotic;Applied in sitting position       Mobility Bed Mobility Overal bed mobility: Needs Assistance Bed Mobility: Rolling;Sidelying to Sit;Sit to Sidelying Rolling: Supervision Sidelying to sit: Min assist     Sit to sidelying: Min assist    Transfers                      Balance Overall balance assessment: Needs assistance Sitting-balance support: Feet supported;Bilateral upper extremity supported Sitting balance-Leahy Scale: Fair       Standing balance-Leahy Scale: Poor                             ADL either  performed or assessed with clinical judgement   ADL Overall ADL's : Needs assistance/impaired Eating/Feeding: Independent;Bed level   Grooming: Wash/dry hands;Sitting;Set up               Lower Body Dressing: Bed level;Set up Lower Body Dressing Details (indicate cue type and reason): donned socks, removed SCDs Toilet Transfer: Minimal assistance;Stand-pivot;BSC             General ADL Comments: pt focused on need for BM     Vision       Perception     Praxis      Cognition Arousal/Alertness: Awake/alert Behavior During Therapy: WFL for tasks assessed/performed Overall Cognitive Status: Within Functional Limits for tasks assessed                                          Exercises     Shoulder Instructions       General Comments      Pertinent Vitals/ Pain       Pain Assessment: Faces Faces Pain Scale: Hurts little more Pain Location: abdomen Pain Descriptors / Indicators: Discomfort Pain Intervention(s): Monitored during session;Repositioned  Home Living  Prior Functioning/Environment              Frequency  Min 2X/week        Progress Toward Goals  OT Goals(current goals can now be found in the care plan section)  Progress towards OT goals: Progressing toward goals  Acute Rehab OT Goals Patient Stated Goal: to go fishing again OT Goal Formulation: With patient Time For Goal Achievement: 05/05/19 Potential to Achieve Goals: Good  Plan Discharge plan remains appropriate    Co-evaluation                 AM-PAC OT "6 Clicks" Daily Activity     Outcome Measure   Help from another person eating meals?: None Help from another person taking care of personal grooming?: A Little Help from another person toileting, which includes using toliet, bedpan, or urinal?: A Lot Help from another person bathing (including washing, rinsing, drying)?: A Little Help  from another person to put on and taking off regular upper body clothing?: A Little Help from another person to put on and taking off regular lower body clothing?: A Lot 6 Click Score: 17    End of Session    OT Visit Diagnosis: Unsteadiness on feet (R26.81);Muscle weakness (generalized) (M62.81);Pain   Activity Tolerance Patient limited by pain(and need to have BM)   Patient Left in bed;with call bell/phone within reach   Nurse Communication          Time: 1122-1140 OT Time Calculation (min): 18 min  Charges: OT General Charges $OT Visit: 1 Visit OT Treatments $Self Care/Home Management : 8-22 mins  Nestor Lewandowsky, OTR/L Acute Rehabilitation Services Pager: [phone] Office: [phone]   Malka So 04/24/2019, 1:11 PM

## 2019-04-24 NOTE — Progress Notes (Signed)
Patient's case was presented at multi-disciplinary CNS tumor board today.  Rad Onc will see patient and plan on RT.  Foundation One testing ordered through Pathology.  We will attempt to coordinate care with Dr. Earlie Server and lung cancer team.

## 2019-04-24 NOTE — Progress Notes (Signed)
Chaplain rec'd referral from case manager. Patient was receptive to visit from chaplain.  Patient expresses shock over the diagnosis and says he's still processing what he's been told.  "My dad died of cancer 7 years ago and I'm thinking about that.  He just gave up, but I don't plan to." Patient says he lives with his mother, age 56, and takes care of her.  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  Patient anxious to get home to continue to care for mother but says sister is also stepping up to help. Patient says he is baptist but his church pastor in [place] would not know him.  Chaplain offered ministry of presence and prayer.  Tamsen Snider Pager [phone]

## 2019-04-25 LAB — GLUCOSE, CAPILLARY
Glucose-Capillary: 110 mg/dL — ABNORMAL HIGH (ref 70–99)
Glucose-Capillary: 130 mg/dL — ABNORMAL HIGH (ref 70–99)
Glucose-Capillary: 153 mg/dL — ABNORMAL HIGH (ref 70–99)
Glucose-Capillary: 157 mg/dL — ABNORMAL HIGH (ref 70–99)
Glucose-Capillary: 81 mg/dL (ref 70–99)
Glucose-Capillary: 89 mg/dL (ref 70–99)

## 2019-04-25 NOTE — Progress Notes (Signed)
Physical Therapy Treatment Patient Details Name: Paul Thornton MRN: [id number] DOB: 1961-05-26 Today's Date: 04/25/2019    History of Present Illness This 58 y.o. male admitted with progressive back and chest wall pain as well as LE weakness and falls.  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   He underwent resection of tumor, laminectomy and decompression of T3-5 with fusion from T2-6.  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  No cranial metastatic dissease seen.  PMH includes COPD     PT Comments    Patient seen for mobility progression. Pt continues to be great candidate for CIR level therapies.    Follow Up Recommendations  CIR     Equipment Recommendations  Rolling walker with 5" wheels;3in1 (PT)    Recommendations for Other Services       Precautions / Restrictions Precautions Precautions: Fall;Back Precaution Comments: cues for back precautions with bed mobility Required Braces or Orthoses: Spinal Brace Spinal Brace: Thoracolumbosacral orthotic;Applied in sitting position    Mobility  Bed Mobility Overal bed mobility: Needs Assistance Bed Mobility: Rolling;Sidelying to Sit Rolling: Supervision Sidelying to sit: Min guard       General bed mobility comments: use of rail; cues for sequencing  Transfers Overall transfer level: Needs assistance Equipment used: Rolling walker (2 wheeled) Transfers: Sit to/from Stand Sit to Stand: Min guard;Min assist         General transfer comment: cues for safe hand placement; assist to power up into standing second trial   Ambulation/Gait Ambulation/Gait assistance: Mod assist Gait Distance (Feet): (75 ft X 2 with seated rest break) Assistive device: Rolling walker (2 wheeled) Gait  Pattern/deviations: Decreased stance time - left;Decreased stride length;Scissoring;Steppage;Decreased dorsiflexion - left;Step-through pattern Gait velocity: decreased   General Gait Details: poor coordination and proprioception; scissoring noted and increased assist required when turning    Stairs             Wheelchair Mobility    Modified Rankin (Stroke Patients Only) Modified Rankin (Stroke Patients Only) Pre-Morbid Rankin Score: No symptoms Modified Rankin: Moderately severe disability     Balance Overall balance assessment: Needs assistance Sitting-balance support: Feet supported;Bilateral upper extremity supported Sitting balance-Leahy Scale: Fair     Standing balance support: Bilateral upper extremity supported Standing balance-Leahy Scale: Poor Standing balance comment: needs external support in standing.                             Cognition Arousal/Alertness: Awake/alert Behavior During Therapy: WFL for tasks assessed/performed Overall Cognitive Status: Within Functional Limits for tasks assessed                                        Exercises      General Comments        Pertinent Vitals/Pain Pain Assessment: No/denies pain    Home Living                      Prior Function            PT Goals (current goals can now be found in the care plan section) Acute Rehab PT  Goals Patient Stated Goal: to go fishing again Progress towards PT goals: Progressing toward goals    Frequency    Min 5X/week      PT Plan Current plan remains appropriate    Co-evaluation              AM-PAC PT "6 Clicks" Mobility   Outcome Measure  Help needed turning from your back to your side while in a flat bed without using bedrails?: A Little Help needed moving from lying on your back to sitting on the side of a flat bed without using bedrails?: A Little Help needed moving to and from a bed to a chair (including a  wheelchair)?: A Little Help needed standing up from a chair using your arms (e.g., wheelchair or bedside chair)?: A Little Help needed to walk in hospital room?: A Lot Help needed climbing 3-5 steps with a railing? : Total 6 Click Score: 15    End of Session Equipment Utilized During Treatment: Back brace;Gait belt Activity Tolerance: Patient tolerated treatment well Patient left: in chair;with call bell/phone within reach Nurse Communication: Mobility status PT Visit Diagnosis: Muscle weakness (generalized) (M62.81);Difficulty in walking, not elsewhere classified (R26.2);Pain;Other symptoms and signs involving the nervous system (R29.898) Pain - Right/Left: (central) Pain - part of body: (mid back)     Time: 6599-3570 PT Time Calculation (min) (ACUTE ONLY): 19 min  Charges:  $Gait Training: 8-22 mins                     Earney Navy, PTA Acute Rehabilitation Services Pager: [phone] Office: [phone]     Darliss Cheney 04/25/2019, 10:59 AM

## 2019-04-25 NOTE — Progress Notes (Signed)
Neurosurgery Service Progress Note  Subjective: No acute events overnight, no new complaints   Objective: Vitals:   04/25/19 0407 04/25/19 0716 04/25/19 0827 04/25/19 1149  BP: 97/69  107/75 98/61  Pulse: 71  69 68  Resp: 18  14 16   Temp: 98.1 F (36.7 C)  98.5 F (36.9 C) 98.1 F (36.7 C)  TempSrc: Oral  Oral Oral  SpO2: 96% 91% 99% 96%  Weight:      Height:       Temp (24hrs), Avg:98 F (36.7 C), Min:97.5 F (36.4 C), Max:98.5 F (36.9 C)  CBC Latest Ref Rng & Units 04/21/2019 04/21/2019 04/21/2019  WBC 4.0 - 10.5 K/uL 13.9(H) - -  Hemoglobin 13.0 - 17.0 g/dL 11.3(L) 10.9(L) 11.6(L)  Hematocrit 39.0 - 52.0 % 34.0(L) 32.0(L) 34.0(L)  Platelets 150 - 400 K/uL 276 - -   BMP Latest Ref Rng & Units 04/21/2019 04/21/2019 04/21/2019  Glucose 70 - 99 mg/dL 125(H) - -  BUN 6 - 20 mg/dL 15 - -  Creatinine 0.61 - 1.24 mg/dL 0.62 - -  Sodium 135 - 145 mmol/L 136 136 136  Potassium 3.5 - 5.1 mmol/L 3.9 3.7 4.4  Chloride 98 - 111 mmol/L 104 - -  CO2 22 - 32 mmol/L 22 - -  Calcium 8.9 - 10.3 mg/dL 8.8(L) - -    Intake/Output Summary (Last 24 hours) at 04/25/2019 1411 Last data filed at 04/25/2019 1153 Gross per 24 hour  Intake -  Output 1650 ml  Net -1650 ml    Current Facility-Administered Medications:  .  0.9 %  sodium chloride infusion (Manually program via Guardrails IV Fluids), , Intravenous, Once, Kristeen Miss, MD .  0.9 %  sodium chloride infusion, 250 mL, Intravenous, PRN, Kristeen Miss, MD .  0.9 %  sodium chloride infusion, 250 mL, Intravenous, Continuous, Elsner, Henry, MD .  acetaminophen (TYLENOL) tablet 650 mg, 650 mg, Oral, Q4H PRN **OR** acetaminophen (TYLENOL) suppository 650 mg, 650 mg, Rectal, Q4H PRN, Kristeen Miss, MD .  albuterol (PROVENTIL) (2.5 MG/3ML) 0.083% nebulizer solution 2.5 mg, 2.5 mg, Nebulization, Q3H PRN, Kristeen Miss, MD .  alum & mag hydroxide-simeth (MAALOX/MYLANTA) 200-200-20 MG/5ML suspension 30 mL, 30 mL, Oral, Q6H PRN, Kristeen Miss, MD,  30 mL at 04/25/19 0841 .  aspirin EC tablet 325 mg, 325 mg, Oral, Daily, Rosalin Hawking, MD, 325 mg at 04/25/19 0841 .  bisacodyl (DULCOLAX) suppository 10 mg, 10 mg, Rectal, Daily PRN, Kristeen Miss, MD, 10 mg at 04/24/19 1724 .  budesonide (PULMICORT) nebulizer solution 0.5 mg, 0.5 mg, Nebulization, BID, Kristeen Miss, MD, 0.5 mg at 04/25/19 0715 .  dexamethasone (DECADRON) injection 4 mg, 4 mg, Intravenous, Q6H, 4 mg at 04/22/19 1710 **OR** dexamethasone (DECADRON) tablet 4 mg, 4 mg, Oral, Q6H, Kristeen Miss, MD, 4 mg at 04/25/19 1155 .  docusate sodium (COLACE) capsule 100 mg, 100 mg, Oral, BID, Kristeen Miss, MD, 100 mg at 04/25/19 0841 .  doxycycline (VIBRA-TABS) tablet 100 mg, 100 mg, Oral, Q12H, Kristeen Miss, MD, 100 mg at 04/25/19 0840 .  fentaNYL (SUBLIMAZE) bolus via infusion 50 mcg, 50 mcg, Intravenous, Q15 min PRN, Desai, Rahul P, PA-C .  heparin injection 5,000 Units, 5,000 Units, Subcutaneous, Q8H, Kristeen Miss, MD, 5,000 Units at 04/25/19 [phone] .  HYDROcodone-acetaminophen (NORCO/VICODIN) 5-325 MG per tablet 1-2 tablet, 1-2 tablet, Oral, Q3H PRN, Kristeen Miss, MD, 2 tablet at 04/25/19 1155 .  insulin aspart (novoLOG) injection 1-3 Units, 1-3 Units, Subcutaneous, Q4H, Kipp Brood, MD, 2 Units at 04/25/19  53 .  ipratropium-albuterol (DUONEB) 0.5-2.5 (3) MG/3ML nebulizer solution 3 mL, 3 mL, Nebulization, Q6H PRN, Kristeen Miss, MD, 3 mL at 04/21/19 1949 .  lactated ringers infusion, , Intravenous, Continuous, Kristeen Miss, MD, Stopped at 04/23/19 [phone] .  menthol-cetylpyridinium (CEPACOL) lozenge 3 mg, 1 lozenge, Oral, PRN **OR** phenol (CHLORASEPTIC) mouth spray 1 spray, 1 spray, Mouth/Throat, PRN, Kristeen Miss, MD .  midazolam (VERSED) injection 2 mg, 2 mg, Intravenous, Q15 min PRN, Desai, Rahul P, PA-C .  midazolam (VERSED) injection 2 mg, 2 mg, Intravenous, Q2H PRN, Desai, Rahul P, PA-C .  morphine 2 MG/ML injection 2-4 mg, 2-4 mg, Intravenous, Q2H PRN, Kristeen Miss, MD, 2 mg  at 04/22/19 1647 .  nicotine (NICODERM CQ - dosed in mg/24 hours) patch 14 mg, 14 mg, Transdermal, Daily, Kristeen Miss, MD, 14 mg at 04/25/19 0844 .  ondansetron (ZOFRAN) tablet 4 mg, 4 mg, Oral, Q6H PRN **OR** ondansetron (ZOFRAN) injection 4 mg, 4 mg, Intravenous, Q6H PRN, Kristeen Miss, MD .  pantoprazole (PROTONIX) EC tablet 40 mg, 40 mg, Oral, Daily, Kristeen Miss, MD, 40 mg at 04/25/19 0840 .  polyethylene glycol (MIRALAX / GLYCOLAX) packet 17 g, 17 g, Oral, Daily PRN, Kristeen Miss, MD, 17 g at 04/24/19 1002 .  polyvinyl alcohol (LIQUIFILM TEARS) 1.4 % ophthalmic solution 1 drop, 1 drop, Both Eyes, PRN, Ostergard, Joyice Faster, MD .  senna (SENOKOT) tablet 8.6 mg, 1 tablet, Oral, BID, Kristeen Miss, MD, 8.6 mg at 04/25/19 0841 .  sodium chloride flush (NS) 0.9 % injection 3 mL, 3 mL, Intravenous, Q12H, Kristeen Miss, MD, 3 mL at 04/24/19 2220 .  sodium chloride flush (NS) 0.9 % injection 3 mL, 3 mL, Intravenous, PRN, Kristeen Miss, MD .  sodium chloride flush (NS) 0.9 % injection 3 mL, 3 mL, Intravenous, Q12H, Kristeen Miss, MD, 3 mL at 04/24/19 2220 .  sodium chloride flush (NS) 0.9 % injection 3 mL, 3 mL, Intravenous, PRN, Kristeen Miss, MD .  sodium phosphate (FLEET) 7-19 GM/118ML enema 1 enema, 1 enema, Rectal, Once PRN, Kristeen Miss, MD   Physical Exam: AOx3, PERRL, EOMI, FS, Strength diffusely 4+/5 in BLE, thoracic sensory level but improving Incision c/d/i  Assessment & Plan: 58 y.o. 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  -continue PT/OT, CIR transfer pending -SCDs/TEDs, SQH  Judith Part  04/25/19 2:11 PM

## 2019-04-25 NOTE — Progress Notes (Signed)
Inpatient Rehab Admissions:  Inpatient Rehab Consult received.  I met with pt at the bedside for rehabilitation assessment and to discuss goals and expectations of an inpatient rehab admission. Pt very interested in CIR and feels it is his best chance for functional return. Pt highly motivated and appears to be great candidate for CIR. Pt very concerned about cost of care. AC has contacted financial counselor, Ebony Smith, for assistance with this case. Per Ebony, she will see the patient tomorrow around 9AM to review paperwork for possible Medicaid/Disability application. Pt would like to hold off on making decision for CIR until he discusses his questions with the Financial Counselor.   AC will check back with pt tomorrow after discussions for final decision regarding CIR.   Kelly Wolfe, OTR/L  Rehab Admissions Coordinator  [phone] 04/25/2019 11:22 AM   

## 2019-04-26 ENCOUNTER — Encounter (HOSPITAL_COMMUNITY): Payer: Self-pay | Admitting: Physical Medicine and Rehabilitation

## 2019-04-26 ENCOUNTER — Encounter (HOSPITAL_COMMUNITY): Payer: Self-pay

## 2019-04-26 ENCOUNTER — Inpatient Hospital Stay (HOSPITAL_COMMUNITY): Payer: Medicaid Other

## 2019-04-26 ENCOUNTER — Inpatient Hospital Stay (HOSPITAL_COMMUNITY)
Admission: RE | Admit: 2019-04-26 | Discharge: 2019-05-06 | DRG: 056 | Disposition: A | Discharge status: Home Health | Payer: Medicaid Other | Source: Intra-hospital | Attending: Physical Medicine & Rehabilitation | Admitting: Physical Medicine & Rehabilitation

## 2019-04-26 DIAGNOSIS — E871 Hypo-osmolality and hyponatremia: Secondary | ICD-10-CM | POA: Diagnosis present

## 2019-04-26 DIAGNOSIS — Z8249 Family history of ischemic heart disease and other diseases of the circulatory system: Secondary | ICD-10-CM | POA: Diagnosis not present

## 2019-04-26 DIAGNOSIS — J449 Chronic obstructive pulmonary disease, unspecified: Secondary | ICD-10-CM

## 2019-04-26 DIAGNOSIS — G9529 Other cord compression: Secondary | ICD-10-CM | POA: Diagnosis present

## 2019-04-26 DIAGNOSIS — Z880 Allergy status to penicillin: Secondary | ICD-10-CM

## 2019-04-26 DIAGNOSIS — Z720 Tobacco use: Secondary | ICD-10-CM

## 2019-04-26 DIAGNOSIS — I7090 Unspecified atherosclerosis: Secondary | ICD-10-CM | POA: Diagnosis present

## 2019-04-26 DIAGNOSIS — I959 Hypotension, unspecified: Secondary | ICD-10-CM

## 2019-04-26 DIAGNOSIS — C7951 Secondary malignant neoplasm of bone: Secondary | ICD-10-CM | POA: Diagnosis present

## 2019-04-26 DIAGNOSIS — G4700 Insomnia, unspecified: Secondary | ICD-10-CM | POA: Diagnosis present

## 2019-04-26 DIAGNOSIS — Z23 Encounter for immunization: Secondary | ICD-10-CM

## 2019-04-26 DIAGNOSIS — R64 Cachexia: Secondary | ICD-10-CM | POA: Diagnosis present

## 2019-04-26 DIAGNOSIS — E43 Unspecified severe protein-calorie malnutrition: Secondary | ICD-10-CM | POA: Insufficient documentation

## 2019-04-26 DIAGNOSIS — E8809 Other disorders of plasma-protein metabolism, not elsewhere classified: Secondary | ICD-10-CM

## 2019-04-26 DIAGNOSIS — F419 Anxiety disorder, unspecified: Secondary | ICD-10-CM | POA: Diagnosis present

## 2019-04-26 DIAGNOSIS — R197 Diarrhea, unspecified: Secondary | ICD-10-CM | POA: Diagnosis present

## 2019-04-26 DIAGNOSIS — E46 Unspecified protein-calorie malnutrition: Secondary | ICD-10-CM

## 2019-04-26 DIAGNOSIS — C3491 Malignant neoplasm of unspecified part of right bronchus or lung: Secondary | ICD-10-CM | POA: Diagnosis present

## 2019-04-26 DIAGNOSIS — I69341 Monoplegia of lower limb following cerebral infarction affecting right dominant side: Secondary | ICD-10-CM

## 2019-04-26 DIAGNOSIS — G8918 Other acute postprocedural pain: Secondary | ICD-10-CM

## 2019-04-26 DIAGNOSIS — D62 Acute posthemorrhagic anemia: Secondary | ICD-10-CM

## 2019-04-26 DIAGNOSIS — J441 Chronic obstructive pulmonary disease with (acute) exacerbation: Secondary | ICD-10-CM

## 2019-04-26 DIAGNOSIS — E876 Hypokalemia: Secondary | ICD-10-CM

## 2019-04-26 DIAGNOSIS — K909 Intestinal malabsorption, unspecified: Secondary | ICD-10-CM

## 2019-04-26 DIAGNOSIS — I69344 Monoplegia of lower limb following cerebral infarction affecting left non-dominant side: Secondary | ICD-10-CM | POA: Diagnosis present

## 2019-04-26 DIAGNOSIS — K5900 Constipation, unspecified: Secondary | ICD-10-CM | POA: Diagnosis present

## 2019-04-26 DIAGNOSIS — J40 Bronchitis, not specified as acute or chronic: Secondary | ICD-10-CM

## 2019-04-26 DIAGNOSIS — Z681 Body mass index (BMI) 19 or less, adult: Secondary | ICD-10-CM | POA: Diagnosis not present

## 2019-04-26 DIAGNOSIS — Z801 Family history of malignant neoplasm of trachea, bronchus and lung: Secondary | ICD-10-CM

## 2019-04-26 DIAGNOSIS — F17210 Nicotine dependence, cigarettes, uncomplicated: Secondary | ICD-10-CM | POA: Diagnosis present

## 2019-04-26 DIAGNOSIS — J439 Emphysema, unspecified: Secondary | ICD-10-CM | POA: Diagnosis present

## 2019-04-26 DIAGNOSIS — K760 Fatty (change of) liver, not elsewhere classified: Secondary | ICD-10-CM | POA: Diagnosis present

## 2019-04-26 DIAGNOSIS — G479 Sleep disorder, unspecified: Secondary | ICD-10-CM

## 2019-04-26 DIAGNOSIS — I639 Cerebral infarction, unspecified: Secondary | ICD-10-CM

## 2019-04-26 DIAGNOSIS — Z981 Arthrodesis status: Secondary | ICD-10-CM

## 2019-04-26 DIAGNOSIS — R109 Unspecified abdominal pain: Secondary | ICD-10-CM | POA: Diagnosis present

## 2019-04-26 DIAGNOSIS — R52 Pain, unspecified: Secondary | ICD-10-CM

## 2019-04-26 DIAGNOSIS — K5903 Drug induced constipation: Secondary | ICD-10-CM

## 2019-04-26 DIAGNOSIS — C3411 Malignant neoplasm of upper lobe, right bronchus or lung: Secondary | ICD-10-CM

## 2019-04-26 DIAGNOSIS — R918 Other nonspecific abnormal finding of lung field: Secondary | ICD-10-CM

## 2019-04-26 DIAGNOSIS — D72829 Elevated white blood cell count, unspecified: Secondary | ICD-10-CM

## 2019-04-26 DIAGNOSIS — I63411 Cerebral infarction due to embolism of right middle cerebral artery: Secondary | ICD-10-CM

## 2019-04-26 DIAGNOSIS — I951 Orthostatic hypotension: Secondary | ICD-10-CM | POA: Diagnosis not present

## 2019-04-26 DIAGNOSIS — C801 Malignant (primary) neoplasm, unspecified: Secondary | ICD-10-CM

## 2019-04-26 HISTORY — DX: Cerebral infarction, unspecified: I63.9

## 2019-04-26 HISTORY — DX: Malignant (primary) neoplasm, unspecified: C80.1

## 2019-04-26 HISTORY — DX: Gastro-esophageal reflux disease without esophagitis: K21.9

## 2019-04-26 LAB — GLUCOSE, CAPILLARY
Glucose-Capillary: 103 mg/dL — ABNORMAL HIGH (ref 70–99)
Glucose-Capillary: 115 mg/dL — ABNORMAL HIGH (ref 70–99)
Glucose-Capillary: 80 mg/dL (ref 70–99)
Glucose-Capillary: 82 mg/dL (ref 70–99)
Glucose-Capillary: 94 mg/dL (ref 70–99)

## 2019-04-26 MED ORDER — PNEUMOCOCCAL VAC POLYVALENT 25 MCG/0.5ML IJ INJ
0.5000 mL | INJECTION | INTRAMUSCULAR | Status: AC
  Administered 2019-04-27: 0.5 mL via INTRAMUSCULAR
  Filled 2019-04-26: qty 0.5

## 2019-04-26 MED ORDER — PANTOPRAZOLE SODIUM 40 MG PO TBEC: 40.0000 mg | DELAYED_RELEASE_TABLET | Freq: Every day | ORAL | Status: DC

## 2019-04-26 MED ORDER — ENSURE ENLIVE PO LIQD: 237.0000 mL | Freq: Three times a day (TID) | ORAL | 12 refills | Status: DC

## 2019-04-26 MED ORDER — FAMOTIDINE 20 MG PO TABS
20.0000 mg | ORAL_TABLET | Freq: Two times a day (BID) | ORAL | Status: DC
  Administered 2019-04-26 – 2019-05-06 (×20): 20 mg via ORAL
  Filled 2019-04-26 (×20): qty 1

## 2019-04-26 MED ORDER — NICOTINE 14 MG/24HR TD PT24
14.0000 mg | MEDICATED_PATCH | Freq: Every day | TRANSDERMAL | Status: DC
  Administered 2019-04-27 – 2019-05-06 (×10): 14 mg via TRANSDERMAL
  Filled 2019-04-26 (×11): qty 1

## 2019-04-26 MED ORDER — IPRATROPIUM-ALBUTEROL 0.5-2.5 (3) MG/3ML IN SOLN: 3.0000 mL | Freq: Four times a day (QID) | RESPIRATORY_TRACT | Status: DC | PRN

## 2019-04-26 MED ORDER — ALUM & MAG HYDROXIDE-SIMETH 200-200-20 MG/5ML PO SUSP
30.0000 mL | ORAL | Status: DC | PRN
  Administered 2019-04-29: 30 mL via ORAL
  Filled 2019-04-26: qty 30

## 2019-04-26 MED ORDER — TRAZODONE HCL 50 MG PO TABS
25.0000 mg | ORAL_TABLET | Freq: Every evening | ORAL | Status: DC | PRN
  Administered 2019-04-28 (×2): 50 mg via ORAL
  Filled 2019-04-26 (×2): qty 1

## 2019-04-26 MED ORDER — GUAIFENESIN-DM 100-10 MG/5ML PO SYRP: 5.0000 mL | ORAL_SOLUTION | Freq: Four times a day (QID) | ORAL | Status: DC | PRN

## 2019-04-26 MED ORDER — PROCHLORPERAZINE MALEATE 5 MG PO TABS: 5.0000 mg | ORAL_TABLET | Freq: Four times a day (QID) | ORAL | Status: DC | PRN

## 2019-04-26 MED ORDER — ONDANSETRON HCL 4 MG PO TABS: 4.0000 mg | ORAL_TABLET | Freq: Four times a day (QID) | ORAL | Status: DC | PRN

## 2019-04-26 MED ORDER — ASPIRIN 325 MG PO TBEC: 325.0000 mg | DELAYED_RELEASE_TABLET | Freq: Every day | ORAL | 0 refills | Status: AC

## 2019-04-26 MED ORDER — ALBUTEROL SULFATE (2.5 MG/3ML) 0.083% IN NEBU: 2.5000 mg | INHALATION_SOLUTION | RESPIRATORY_TRACT | Status: DC | PRN

## 2019-04-26 MED ORDER — PROCHLORPERAZINE 25 MG RE SUPP: 12.5000 mg | Freq: Four times a day (QID) | RECTAL | Status: DC | PRN

## 2019-04-26 MED ORDER — PHENOL 1.4 % MT LIQD: 1.0000 | OROMUCOSAL | Status: DC | PRN

## 2019-04-26 MED ORDER — FLEET ENEMA 7-19 GM/118ML RE ENEM
1.0000 | ENEMA | Freq: Once | RECTAL | Status: AC
  Administered 2019-04-26: 1 via RECTAL
  Filled 2019-04-26: qty 1

## 2019-04-26 MED ORDER — SENNOSIDES-DOCUSATE SODIUM 8.6-50 MG PO TABS: 2.0000 | ORAL_TABLET | Freq: Every day | ORAL | Status: DC

## 2019-04-26 MED ORDER — DIPHENHYDRAMINE HCL 12.5 MG/5ML PO ELIX: 12.5000 mg | ORAL_SOLUTION | Freq: Four times a day (QID) | ORAL | Status: DC | PRN

## 2019-04-26 MED ORDER — BUDESONIDE 0.5 MG/2ML IN SUSP
0.5000 mg | Freq: Two times a day (BID) | RESPIRATORY_TRACT | Status: DC
  Administered 2019-04-26 – 2019-05-06 (×18): 0.5 mg via RESPIRATORY_TRACT
  Filled 2019-04-26 (×22): qty 2

## 2019-04-26 MED ORDER — DOCUSATE SODIUM 100 MG PO CAPS: 100.0000 mg | ORAL_CAPSULE | Freq: Two times a day (BID) | ORAL | Status: DC

## 2019-04-26 MED ORDER — ACETAMINOPHEN 325 MG PO TABS
325.0000 mg | ORAL_TABLET | ORAL | Status: DC | PRN
  Administered 2019-04-27 – 2019-05-04 (×5): 650 mg via ORAL
  Filled 2019-04-26 (×6): qty 2

## 2019-04-26 MED ORDER — MENTHOL 3 MG MT LOZG: 1.0000 | LOZENGE | OROMUCOSAL | Status: DC | PRN

## 2019-04-26 MED ORDER — DOXYCYCLINE HYCLATE 100 MG PO TABS: 100.0000 mg | ORAL_TABLET | Freq: Two times a day (BID) | ORAL | Status: DC

## 2019-04-26 MED ORDER — HYDROCODONE-ACETAMINOPHEN 5-325 MG PO TABS: 1.0000 | ORAL_TABLET | ORAL | Status: DC | PRN

## 2019-04-26 MED ORDER — POLYETHYLENE GLYCOL 3350 17 G PO PACK
17.0000 g | PACK | Freq: Every day | ORAL | Status: DC | PRN
  Administered 2019-04-27 – 2019-05-05 (×4): 17 g via ORAL
  Filled 2019-04-26 (×4): qty 1

## 2019-04-26 MED ORDER — PROCHLORPERAZINE EDISYLATE 10 MG/2ML IJ SOLN: 5.0000 mg | Freq: Four times a day (QID) | INTRAMUSCULAR | Status: DC | PRN

## 2019-04-26 MED ORDER — BISACODYL 10 MG RE SUPP
10.0000 mg | Freq: Every day | RECTAL | Status: DC | PRN
  Administered 2019-04-27 – 2019-04-29 (×2): 10 mg via RECTAL
  Filled 2019-04-26: qty 1

## 2019-04-26 MED ORDER — MORPHINE SULFATE 15 MG PO TABS
15.0000 mg | ORAL_TABLET | ORAL | Status: DC | PRN
  Administered 2019-04-26 – 2019-05-04 (×22): 15 mg via ORAL
  Filled 2019-04-26 (×24): qty 1

## 2019-04-26 MED ORDER — ONDANSETRON HCL 4 MG/2ML IJ SOLN: 4.0000 mg | Freq: Four times a day (QID) | INTRAMUSCULAR | Status: DC | PRN

## 2019-04-26 MED ORDER — HEPARIN SODIUM (PORCINE) 5000 UNIT/ML IJ SOLN
5000.0000 [IU] | Freq: Three times a day (TID) | INTRAMUSCULAR | Status: DC
  Administered 2019-04-26 – 2019-05-06 (×29): 5000 [IU] via SUBCUTANEOUS
  Filled 2019-04-26 (×28): qty 1

## 2019-04-26 MED ORDER — ALPRAZOLAM 0.25 MG PO TABS
0.2500 mg | ORAL_TABLET | Freq: Three times a day (TID) | ORAL | Status: DC | PRN
  Administered 2019-04-28 – 2019-05-06 (×16): 0.25 mg via ORAL
  Filled 2019-04-26 (×16): qty 1

## 2019-04-26 MED ORDER — POLYVINYL ALCOHOL 1.4 % OP SOLN
1.0000 [drp] | OPHTHALMIC | Status: DC | PRN
  Filled 2019-04-26: qty 15

## 2019-04-26 MED ORDER — FLEET ENEMA 7-19 GM/118ML RE ENEM: 1.0000 | ENEMA | Freq: Once | RECTAL | Status: DC | PRN

## 2019-04-26 MED ORDER — ASPIRIN EC 325 MG PO TBEC
325.0000 mg | DELAYED_RELEASE_TABLET | Freq: Every day | ORAL | Status: DC
  Administered 2019-04-27 – 2019-05-06 (×10): 325 mg via ORAL
  Filled 2019-04-26 (×10): qty 1

## 2019-04-26 MED ORDER — ENSURE ENLIVE PO LIQD
237.0000 mL | Freq: Three times a day (TID) | ORAL | Status: DC
  Administered 2019-04-26: 237 mL via ORAL

## 2019-04-26 NOTE — PMR Pre-admission (Signed)
PMR Admission Coordinator Pre-Admission Assessment  Patient: Paul Thornton is an 58 y.o., male MRN: [id number] DOB: April 08, 1961 Height: 5' 9"  (175.3 cm) Weight: 46.8 kg  Insurance Information HMO:     PPO:      PCP:      IPA:      80/20:      OTHER:  PRIMARY: None (pt is uninsured)      Policy#:       Subscriber:  CM Name:       Phone#:      Fax#:  Pre-Cert#:       Employer:  Benefits:  Phone #:      Name:  Eff. Date:      Deduct:       Out of Pocket Max:       Life Max:  CIR: Pt is aware of the daily estimated cost of care ($3,500). He has been put in contact with financial counselor who has submitted an application for Medicaid.       SNF:  Outpatient:      Co-Pay:  Home Health:       Co-Pay:  DME:      Co-Pay:  Providers:  SECONDARY: None      Policy#:       Subscriber:  CM Name:       Phone#:      Fax#:  Pre-Cert#:       Employer:  Benefits:  Phone #:      Name:  Eff. Date:      Deduct:       Out of Pocket Max:       Life Max:  CIR:       SNF:  Outpatient:      Co-Pay: Home Health:       Co-Pay:  DME:      Co-Pay:   Medicaid Application Date: 7/1 MAD application faxed out by Carmelina Noun      Case Manager: Disability Application Date:       Case Worker:   The "Data Collection Information Summary" for patients in Inpatient Rehabilitation Facilities with attached "Privacy Act Free Union Records" was provided and verbally reviewed with: Patient  Emergency Contact Information Contact Information    Name Relation Home Work Mobile   Marks Mother [phone]     [name]   [phone]      Current Medical History  Patient Admitting Diagnosis: thoracic decompression and instrumented fusion for 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 Pt presented to the hospital with increased SOB, wheezing and worsening coughing spells. Pt also reported right sided  chest discomfort that radiated to his back. Pt was found to be tachypneic, borderline hypoxic, and wheezing. CT of chest shows right lung mass with possible metastatic lung cancer involving the pleura, ribs, and T4-5 vertebral bodies. An MRI of the thoracic spine demonsrates a large mass in the right chest cavity eroding through the vertebrae from T3-5. At T4 and T5 there is erosion of the pedicles and compression of the spinal cord. Due to bilatearl leg weakness secondary to spinal cord compression, pt underwent a laminectomy and decompression of the thoracic spinal canal from T3-T5 with partial greater than 70% vertebrectomies of T3 and T4. Reconstruction with iliac crest allograft strut and fixation with pedicle screws from T2-T6. 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 Pt was seen by therapies with recommendation for CIR due to functional deficits. Pt is to be admitted to CIR on 04/26/19.   Complete NIHSS TOTAL: 0  Patient's medical record from [facility] has been reviewed by the rehabilitation admission coordinator and physician.  Past Medical History  Past Medical History:  Diagnosis Date  . Bronchitis   . COPD (chronic obstructive pulmonary disease) (HCC)     Family History   family history is not on file.  Prior Rehab/Hospitalizations Has the patient had prior rehab or hospitalizations prior to admission? No  Has the patient had major surgery during 100 days prior to admission? Yes   Current Medications  Current Facility-Administered Medications:  .  0.9 %  sodium chloride infusion (Manually program via Guardrails IV Fluids), , Intravenous, Once, Kristeen Miss, MD .  0.9 %  sodium chloride infusion, 250 mL, Intravenous, PRN, Kristeen Miss, MD .  0.9 %  sodium chloride infusion, 250 mL, Intravenous, Continuous, Elsner, Henry, MD .  acetaminophen (TYLENOL) tablet 650 mg, 650 mg, Oral, Q4H PRN, 650 mg at 04/25/19  1622 **OR** acetaminophen (TYLENOL) suppository 650 mg, 650 mg, Rectal, Q4H PRN, Kristeen Miss, MD .  albuterol (PROVENTIL) (2.5 MG/3ML) 0.083% nebulizer solution 2.5 mg, 2.5 mg, Nebulization, Q3H PRN, Kristeen Miss, MD .  alum & mag hydroxide-simeth (MAALOX/MYLANTA) 200-200-20 MG/5ML suspension 30 mL, 30 mL, Oral, Q6H PRN, Kristeen Miss, MD, 30 mL at 04/25/19 0841 .  aspirin EC tablet 325 mg, 325 mg, Oral, Daily, Rosalin Hawking, MD, 325 mg at 04/26/19 0906 .  bisacodyl (DULCOLAX) suppository 10 mg, 10 mg, Rectal, Daily PRN, Kristeen Miss, MD, 10 mg at 04/26/19 0305 .  budesonide (PULMICORT) nebulizer solution 0.5 mg, 0.5 mg, Nebulization, BID, Kristeen Miss, MD, 0.5 mg at 04/25/19 1938 .  docusate sodium (COLACE) capsule 100 mg, 100 mg, Oral, BID, Kristeen Miss, MD, Stopped at 04/25/19 2246 .  doxycycline (VIBRA-TABS) tablet 100 mg, 100 mg, Oral, Q12H, Kristeen Miss, MD, 100 mg at 04/26/19 0906 .  feeding supplement (ENSURE ENLIVE) (ENSURE ENLIVE) liquid 237 mL, 237 mL, Oral, TID BM, Kristeen Miss, MD, 237 mL at 04/26/19 1532 .  fentaNYL (SUBLIMAZE) bolus via infusion 50 mcg, 50 mcg, Intravenous, Q15 min PRN, Desai, Rahul P, PA-C .  heparin injection 5,000 Units, 5,000 Units, Subcutaneous, Q8H, Kristeen Miss, MD, 5,000 Units at 04/26/19 1533 .  HYDROcodone-acetaminophen (NORCO/VICODIN) 5-325 MG per tablet 1-2 tablet, 1-2 tablet, Oral, Q3H PRN, Kristeen Miss, MD, 1 tablet at 04/26/19 1533 .  insulin aspart (novoLOG) injection 1-3 Units, 1-3 Units, Subcutaneous, Q4H, Agarwala, Ravi, MD, 1 Units at 04/25/19 1759 .  ipratropium-albuterol (DUONEB) 0.5-2.5 (3) MG/3ML nebulizer solution 3 mL, 3 mL, Nebulization, Q6H PRN, Kristeen Miss, MD, 3 mL at 04/21/19 1949 .  lactated ringers infusion, , Intravenous, Continuous, Kristeen Miss, MD, Stopped at 04/23/19 [phone] .  menthol-cetylpyridinium (CEPACOL) lozenge 3 mg, 1 lozenge, Oral, PRN **OR** phenol (CHLORASEPTIC) mouth spray 1 spray, 1 spray, Mouth/Throat, PRN,  Kristeen Miss, MD .  midazolam (VERSED) injection 2 mg, 2 mg, Intravenous, Q15 min PRN, Desai, Rahul P, PA-C .  midazolam (VERSED) injection 2 mg, 2 mg, Intravenous, Q2H PRN, Desai, Rahul P, PA-C .  morphine 2 MG/ML injection 2-4 mg, 2-4 mg, Intravenous, Q2H PRN, Kristeen Miss, MD, 2 mg at 04/26/19 0907 .  nicotine (NICODERM CQ - dosed in mg/24 hours) patch 14 mg, 14 mg, Transdermal, Daily, Kristeen Miss, MD, 14 mg at 04/26/19 0912 .  ondansetron (ZOFRAN) tablet  4 mg, 4 mg, Oral, Q6H PRN **OR** ondansetron (ZOFRAN) injection 4 mg, 4 mg, Intravenous, Q6H PRN, Kristeen Miss, MD, 4 mg at 04/26/19 0549 .  pantoprazole (PROTONIX) EC tablet 40 mg, 40 mg, Oral, Daily, Kristeen Miss, MD, 40 mg at 04/26/19 0906 .  polyethylene glycol (MIRALAX / GLYCOLAX) packet 17 g, 17 g, Oral, Daily PRN, Kristeen Miss, MD, 17 g at 04/24/19 1002 .  polyvinyl alcohol (LIQUIFILM TEARS) 1.4 % ophthalmic solution 1 drop, 1 drop, Both Eyes, PRN, Ostergard, Joyice Faster, MD .  senna (SENOKOT) tablet 8.6 mg, 1 tablet, Oral, BID, Kristeen Miss, MD, Stopped at 04/25/19 2247 .  sodium chloride flush (NS) 0.9 % injection 3 mL, 3 mL, Intravenous, Q12H, Kristeen Miss, MD, 3 mL at 04/26/19 0914 .  sodium chloride flush (NS) 0.9 % injection 3 mL, 3 mL, Intravenous, PRN, Kristeen Miss, MD .  sodium chloride flush (NS) 0.9 % injection 3 mL, 3 mL, Intravenous, Q12H, Kristeen Miss, MD, 3 mL at 04/26/19 0914 .  sodium chloride flush (NS) 0.9 % injection 3 mL, 3 mL, Intravenous, PRN, Kristeen Miss, MD .  sodium phosphate (FLEET) 7-19 GM/118ML enema 1 enema, 1 enema, Rectal, Once PRN, Kristeen Miss, MD  Patients Current Diet:  Diet Order            Diet regular Room service appropriate? Yes; Fluid consistency: Thin  Diet effective now              Precautions / Restrictions Precautions Precautions: Fall, Back Precaution Comments: cues for back precautions with bed mobility Spinal Brace: Thoracolumbosacral orthotic, Applied in sitting  position Restrictions Weight Bearing Restrictions: No   Has the patient had 2 or more falls or a fall with injury in the past year? Yes  Prior Activity Level Community (5-7x/wk): very active doing odd jobs; drove PTA: independent PTA  Prior Functional Level Self Care: Did the patient need help bathing, dressing, using the toilet or eating? Independent  Indoor Mobility: Did the patient need assistance with walking from room to room (with or without device)? Independent  Stairs: Did the patient need assistance with internal or external stairs (with or without device)? Independent  Functional Cognition: Did the patient need help planning regular tasks such as shopping or remembering to take medications? Independent  Home Assistive Devices / Equipment Home Assistive Devices/Equipment: None Home Equipment: None  Prior Device Use: Indicate devices/aids used by the patient prior to current illness, exacerbation or injury? None of the above  Current Functional Level Cognition  Overall Cognitive Status: Within Functional Limits for tasks assessed Current Attention Level: Selective Orientation Level: Oriented X4 General Comments: Not specifically tested, conversation normal, good carry over of learned activities.  He did not remember meeting me in the ICU (but this would still be normal)    Extremity Assessment (includes Sensation/Coordination)  Upper Extremity Assessment: Generalized weakness  Lower Extremity Assessment: Generalized weakness, Defer to PT evaluation RLE Deficits / Details: left continues to be functionally weaker than right with gross in bed and EOB strength testing 3/5, pt with hyperextension moment on the left with WB and stepping.  RLE Sensation: decreased light touch LLE Deficits / Details: left continues to be functionally weaker than right with gross in bed and EOB strength testing 3/5, pt with hyperextension moment on the left with WB and stepping.  LLE Sensation:  decreased light touch LLE Coordination: decreased gross motor    ADLs  Overall ADL's : Needs assistance/impaired Eating/Feeding: Set up, Sitting Grooming: Wash/dry hands,  Wash/dry face, Oral care, Min guard, Standing Upper Body Bathing: Minimal assistance, Sitting Lower Body Bathing: Maximal assistance, Sit to/from stand Upper Body Dressing : Sitting, Moderate assistance Lower Body Dressing: Minimal assistance, Sitting/lateral leans, Sit to/from stand Lower Body Dressing Details (indicate cue type and reason): figure 4 technique  Toilet Transfer: Min guard, Ambulation, RW Toilet Transfer Details (indicate cue type and reason): simulated Toileting- Clothing Manipulation and Hygiene: Total assistance, Sit to/from stand Functional mobility during ADLs: Minimal assistance, Rolling walker, Cueing for safety General ADL Comments: set-upA for UB ADL and minA for LB ADL.    Mobility  Overal bed mobility: Needs Assistance Bed Mobility: Rolling, Sidelying to Sit Rolling: Supervision Sidelying to sit: Min guard Sit to sidelying: Min assist General bed mobility comments: Pt was OOB in the recliner chair.     Transfers  Overall transfer level: Needs assistance Equipment used: Rolling walker (2 wheeled) Transfers: Sit to/from Stand Sit to Stand: Min guard Stand pivot transfers: +2 physical assistance, Mod assist General transfer comment: minguardA for initial standing balance    Ambulation / Gait / Stairs / Wheelchair Mobility  Ambulation/Gait Ambulation/Gait assistance: Mod assist Gait Distance (Feet): 100 Feet Assistive device: Rolling walker (2 wheeled) Gait Pattern/deviations: Decreased stance time - left, Decreased stride length, Scissoring, Steppage, Decreased dorsiflexion - left, Step-through pattern General Gait Details: Pt did better keeping his feet apart today, slowing his speed and keeping turns wide at his BOS.  As he fatigued his steppage pattern increased and so did the  hyperextension moments at both knees during stance.  Gait velocity: decreased Gait velocity interpretation: 1.31 - 2.62 ft/sec, indicative of limited community ambulator    Posture / Balance Dynamic Sitting Balance Sitting balance - Comments: supervision EOB, difficulty due to pain with weight shifting.  Balance Overall balance assessment: Needs assistance Sitting-balance support: Feet supported, Bilateral upper extremity supported Sitting balance-Leahy Scale: Fair Sitting balance - Comments: supervision EOB, difficulty due to pain with weight shifting.  Standing balance support: Bilateral upper extremity supported Standing balance-Leahy Scale: Poor Standing balance comment: needs external support in standing.  High Level Balance Comments: able to maneuver and open door with backward stepping while opening door with minA for support.    Special needs/care consideration BiPAP/CPAP : no CPM : no Continuous Drip IV : no Dialysis : no     Days : no Life Vest : no Oxygen : no Special Bed : no Trach Size : no Wound Vac (area) : no      Location : no Skin: surgical incision to back, puncture to right upper vertebral column                          Bowel mgmt: continent, last BM: 04/26/19 Bladder mgmt: continent Diabetic mgmt: : no Behavioral consideration : no Chemo/radiation : pending radiation (per Dr. Zada Finders (attending), radiation would not occur until at least 2 week post up follow up to ensure incision site healed.    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  Sisters are taking care of her now  Discharge Living Setting Plans for Discharge Living Setting: Patient's  home, Lives with (comment)(his mother (her house). 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; pt's sister can do these things) Contact Information: sister: [name] [phone]) mother ([place] : [phone]) Anticipated Caregiver: mother and sister Anticipated Caregiver's Contact Information: see above Ability/Limitations of Caregiver: Supervision 24/7; Min A only 1x/day visit by sister who works during the day Caregiver Availability: 24/7 Discharge Plan Discussed with Primary Caregiver: Consulting civil engineer and mother) Is Caregiver In Agreement with Plan?: Yes Does Caregiver/Family have Issues with Lodging/Transportation while Pt is in Rehab?: No  Goals/Additional Needs Patient/Family Goal for Rehab: PT/OT: Supervision; SLP: NA Expected length of stay: 6-10 days Cultural Considerations: NA Dietary Needs: regular diet, thin liquids Equipment Needs: TBD Additional Information: possible radiation therapy in near future Pt/Family Agrees to Admission and willing to participate: Yes Program Orientation Provided & Reviewed with Pt/Caregiver Including Roles  & Responsibilities: Yes(pt and sister and mother)  Barriers to Discharge: Lack of/limited family support, Insurance for SNF coverage, Pending chemo/radiation(may have raditional soon post DC; mother only do Supervision)  Decrease burden of Care through IP rehab admission:   Possible need for SNF placement upon discharge: Not anticipated; Anticipate pt has ability to reach supervision level which is a level he will  be able to return home with his mother for support. Pt understands the goal is to return home.   Patient Condition: I have reviewed medical records from [facility], spoken with CM, and patient and family member. I met with patient at the bedside for inpatient rehabilitation assessment.  Patient will benefit from ongoing PT and OT, can actively participate in 3 hours of therapy a day 5 days of the week, and can make measurable gains during the admission.  Patient will also benefit from the coordinated team approach during an Inpatient Acute Rehabilitation admission.  The patient will receive intensive therapy as well as Rehabilitation physician, nursing, social worker, and care management interventions.  Due to bladder management, bowel management, safety, skin/wound care, disease management, medication administration, pain management and patient education the patient requires 24 hour a day rehabilitation nursing.  The patient is currently Mod A with mobility and basic ADLs.  Discharge setting and therapy post discharge at home with home health is anticipated.  Patient has agreed to participate in the Acute Inpatient Rehabilitation Program and will admit today.  Preadmission Screen Completed By:  Jhonnie Garner, 04/26/2019 4:09 PM ______________________________________________________________________   Discussed status with Dr. Posey Pronto on 04/26/19 at 4:09PM and received approval for admission today.  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 Does the need for close, 24 hr/day Medical supervision in concert with the patient's rehab needs make it unreasonable for this patient to be served in a less intensive setting? Yes  2. Co-Morbidities requiring supervision/potential complications: tobacco abuse, COPD, and GERD. Pt presented to the hospital  with increased SOB, wheezing and worsening coughing spells 3. Due to bladder management, bowel management, safety, skin/wound care, disease management, pain management and patient education, does the patient require 24 hr/day rehab nursing? Yes 4. Does the patient require coordinated care of a physician, rehab nurse, PT (1-2 hrs/day, 5 days/week) and OT (1-2 hrs/day, 5 days/week) to address physical  and functional deficits in the context of the above medical diagnosis(es)? Yes Addressing deficits in the following areas: balance, endurance, locomotion, strength, transferring, bathing, dressing, toileting and psychosocial support 5. Can the patient actively participate in an intensive therapy program of at least 3 hrs of therapy 5 days a week? Yes 6. The potential for patient to make measurable gains while on inpatient rehab is excellent 7. Anticipated functional outcomes upon discharge from inpatients are: supervision PT, supervision and min assist OT, n/a SLP 8. Estimated rehab length of stay to reach the above functional goals is: 10-14 days. 9. Anticipated D/C setting: Home 10. Anticipated post D/C treatments: HH therapy and Home excercise program 11. Overall Rehab/Functional Prognosis: good  MD Signature: Delice Lesch, MD, ABPMR

## 2019-04-26 NOTE — Progress Notes (Signed)
Physical Therapy Treatment Patient Details Name: Paul Thornton MRN: [id number] DOB: 07-Jun-1961 Today's Date: 04/26/2019    History of Present Illness This 58 y.o. male admitted with progressive back and chest wall pain as well as LE weakness and falls.  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   He underwent resection of tumor, laminectomy and decompression of T3-5 with fusion from T2-6.  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  No cranial metastatic dissease seen.  PMH includes COPD     PT Comments    Pt is progressing well with his gait stability, still requiring up to mod assist for turns and as he fatigues (did not have chair to follow today).  Education provided and pt demonstrated good understanding of safe hand placement during transitions from sit to stand.  He is eager to work hard and get better and remains an excellent rehab candidate.  PT will continue to follow acutely for safe mobility progression   Follow Up Recommendations  CIR     Equipment Recommendations  Rolling walker with 5" wheels;3in1 (PT);Wheelchair (measurements PT);Wheelchair cushion (measurements PT)    Recommendations for Other Services   NA     Precautions / Restrictions Precautions Precautions: Fall;Back Required Braces or Orthoses: Spinal Brace Spinal Brace: Thoracolumbosacral orthotic;Applied in sitting position    Mobility  Bed Mobility               General bed mobility comments: Pt was OOB in the recliner chair.   Transfers Overall transfer level: Needs assistance Equipment used: Rolling walker (2 wheeled) Transfers: Sit to/from Stand Sit to Stand: Min assist         General transfer comment: Min assist to support trunk during transitions.  We practiced  sit to stand twice to reinforce safe hand placement during transitions.  We will see if it carries over to OT's session.   Ambulation/Gait Ambulation/Gait assistance: Mod assist Gait Distance (Feet): 100 Feet Assistive device: Rolling walker (2 wheeled) Gait Pattern/deviations: Decreased stance time - left;Decreased stride length;Scissoring;Steppage;Decreased dorsiflexion - left;Step-through pattern Gait velocity: decreased Gait velocity interpretation: 1.31 - 2.62 ft/sec, indicative of limited community ambulator General Gait Details: Pt did better keeping his feet apart today, slowing his speed and keeping turns wide at his BOS.  As he fatigued his steppage pattern increased and so did the hyperextension moments at both knees during stance.           Balance Overall balance assessment: Needs assistance Sitting-balance support: Feet supported;Bilateral upper extremity supported Sitting balance-Leahy Scale: Fair     Standing balance support: Bilateral upper extremity supported Standing balance-Leahy Scale: Poor Standing balance comment: needs external support in standing.                             Cognition Arousal/Alertness: Awake/alert Behavior During Therapy: WFL for tasks assessed/performed Overall Cognitive Status: Within Functional Limits for tasks assessed                                 General Comments: Not specifically tested, conversation normal, good carry over of learned activities.  He did not remember meeting me in the ICU (but this  would still be normal)      Exercises Other Exercises Other Exercises: Pt reports compliance with ankle pumps, LAQs and seated marches throughout the day.         Pertinent Vitals/Pain Pain Assessment: Faces Faces Pain Scale: Hurts little more Pain Location: back, incisional Pain Descriptors / Indicators: Grimacing;Guarding Pain Intervention(s): Limited activity within patient's tolerance;Monitored  during session;Premedicated before session;Repositioned           PT Goals (current goals can now be found in the care plan section) Acute Rehab PT Goals Patient Stated Goal: to go fishing again Progress towards PT goals: Progressing toward goals    Frequency    Min 5X/week      PT Plan Current plan remains appropriate       AM-PAC PT "6 Clicks" Mobility   Outcome Measure  Help needed turning from your back to your side while in a flat bed without using bedrails?: A Little Help needed moving from lying on your back to sitting on the side of a flat bed without using bedrails?: A Little Help needed moving to and from a bed to a chair (including a wheelchair)?: A Little Help needed standing up from a chair using your arms (e.g., wheelchair or bedside chair)?: A Little Help needed to walk in hospital room?: A Lot Help needed climbing 3-5 steps with a railing? : Total 6 Click Score: 15    End of Session Equipment Utilized During Treatment: Gait belt;Back brace Activity Tolerance: Patient tolerated treatment well Patient left: in chair;with call bell/phone within reach   PT Visit Diagnosis: Muscle weakness (generalized) (M62.81);Difficulty in walking, not elsewhere classified (R26.2);Pain;Other symptoms and signs involving the nervous system (R29.898) Pain - Right/Left: (central) Pain - part of body: (mid back)     Time: 9030-0923 PT Time Calculation (min) (ACUTE ONLY): 15 min  Charges:  $Gait Training: 8-22 mins                    Rabiah Goeser B. Kadi Hession, PT, DPT  Acute Rehabilitation [phone] pager [phone] office  @ [facility]: [phone]   04/26/2019, 2:12 PM

## 2019-04-26 NOTE — Progress Notes (Signed)
Neurosurgery Service Progress Note  Subjective: No acute events overnight, had some agitation last night that was reportedly concerning for seizure-like activity, he recalls the entire event and the description sounds incompatible with a seizure. He seems more despondent today, says he's just tired.   Objective: Vitals:   04/26/19 0018 04/26/19 0339 04/26/19 0532 04/26/19 0856  BP: (!) 87/55 98/66 (!) 128/96 110/74  Pulse: 64 65 77 76  Resp: 19 17  15   Temp: 97.6 F (36.4 C) (!) 97.4 F (36.3 C)  97.8 F (36.6 C)  TempSrc: Oral Oral  Oral  SpO2: 98% 98% 97% 97%  Weight:      Height:       Temp (24hrs), Avg:97.8 F (36.6 C), Min:97.4 F (36.3 C), Max:98.1 F (36.7 C)  CBC Latest Ref Rng & Units 04/21/2019 04/21/2019 04/21/2019  WBC 4.0 - 10.5 K/uL 13.9(H) - -  Hemoglobin 13.0 - 17.0 g/dL 11.3(L) 10.9(L) 11.6(L)  Hematocrit 39.0 - 52.0 % 34.0(L) 32.0(L) 34.0(L)  Platelets 150 - 400 K/uL 276 - -   BMP Latest Ref Rng & Units 04/21/2019 04/21/2019 04/21/2019  Glucose 70 - 99 mg/dL 125(H) - -  BUN 6 - 20 mg/dL 15 - -  Creatinine 0.61 - 1.24 mg/dL 0.62 - -  Sodium 135 - 145 mmol/L 136 136 136  Potassium 3.5 - 5.1 mmol/L 3.9 3.7 4.4  Chloride 98 - 111 mmol/L 104 - -  CO2 22 - 32 mmol/L 22 - -  Calcium 8.9 - 10.3 mg/dL 8.8(L) - -    Intake/Output Summary (Last 24 hours) at 04/26/2019 1037 Last data filed at 04/26/2019 0852 Gross per 24 hour  Intake 462 ml  Output 1815 ml  Net -1353 ml    Current Facility-Administered Medications:  .  0.9 %  sodium chloride infusion (Manually program via Guardrails IV Fluids), , Intravenous, Once, Kristeen Miss, MD .  0.9 %  sodium chloride infusion, 250 mL, Intravenous, PRN, Kristeen Miss, MD .  0.9 %  sodium chloride infusion, 250 mL, Intravenous, Continuous, Elsner, Henry, MD .  acetaminophen (TYLENOL) tablet 650 mg, 650 mg, Oral, Q4H PRN, 650 mg at 04/25/19 1622 **OR** acetaminophen (TYLENOL) suppository 650 mg, 650 mg, Rectal, Q4H PRN, Kristeen Miss, MD .  albuterol (PROVENTIL) (2.5 MG/3ML) 0.083% nebulizer solution 2.5 mg, 2.5 mg, Nebulization, Q3H PRN, Kristeen Miss, MD .  alum & mag hydroxide-simeth (MAALOX/MYLANTA) 200-200-20 MG/5ML suspension 30 mL, 30 mL, Oral, Q6H PRN, Kristeen Miss, MD, 30 mL at 04/25/19 0841 .  aspirin EC tablet 325 mg, 325 mg, Oral, Daily, Rosalin Hawking, MD, 325 mg at 04/26/19 0906 .  bisacodyl (DULCOLAX) suppository 10 mg, 10 mg, Rectal, Daily PRN, Kristeen Miss, MD, 10 mg at 04/26/19 0305 .  budesonide (PULMICORT) nebulizer solution 0.5 mg, 0.5 mg, Nebulization, BID, Kristeen Miss, MD, 0.5 mg at 04/25/19 1938 .  docusate sodium (COLACE) capsule 100 mg, 100 mg, Oral, BID, Kristeen Miss, MD, Stopped at 04/25/19 2246 .  doxycycline (VIBRA-TABS) tablet 100 mg, 100 mg, Oral, Q12H, Kristeen Miss, MD, 100 mg at 04/26/19 0906 .  fentaNYL (SUBLIMAZE) bolus via infusion 50 mcg, 50 mcg, Intravenous, Q15 min PRN, Desai, Rahul P, PA-C .  heparin injection 5,000 Units, 5,000 Units, Subcutaneous, Q8H, Kristeen Miss, MD, 5,000 Units at 04/26/19 0620 .  HYDROcodone-acetaminophen (NORCO/VICODIN) 5-325 MG per tablet 1-2 tablet, 1-2 tablet, Oral, Q3H PRN, Kristeen Miss, MD, 2 tablet at 04/25/19 2035 .  insulin aspart (novoLOG) injection 1-3 Units, 1-3 Units, Subcutaneous, Q4H, Agarwala, Ravi,  MD, 1 Units at 04/25/19 1759 .  ipratropium-albuterol (DUONEB) 0.5-2.5 (3) MG/3ML nebulizer solution 3 mL, 3 mL, Nebulization, Q6H PRN, Kristeen Miss, MD, 3 mL at 04/21/19 1949 .  lactated ringers infusion, , Intravenous, Continuous, Kristeen Miss, MD, Stopped at 04/23/19 [phone] .  menthol-cetylpyridinium (CEPACOL) lozenge 3 mg, 1 lozenge, Oral, PRN **OR** phenol (CHLORASEPTIC) mouth spray 1 spray, 1 spray, Mouth/Throat, PRN, Kristeen Miss, MD .  midazolam (VERSED) injection 2 mg, 2 mg, Intravenous, Q15 min PRN, Desai, Rahul P, PA-C .  midazolam (VERSED) injection 2 mg, 2 mg, Intravenous, Q2H PRN, Desai, Rahul P, PA-C .  morphine 2 MG/ML  injection 2-4 mg, 2-4 mg, Intravenous, Q2H PRN, Kristeen Miss, MD, 2 mg at 04/26/19 0907 .  nicotine (NICODERM CQ - dosed in mg/24 hours) patch 14 mg, 14 mg, Transdermal, Daily, Kristeen Miss, MD, 14 mg at 04/26/19 0912 .  ondansetron (ZOFRAN) tablet 4 mg, 4 mg, Oral, Q6H PRN **OR** ondansetron (ZOFRAN) injection 4 mg, 4 mg, Intravenous, Q6H PRN, Kristeen Miss, MD, 4 mg at 04/26/19 0549 .  pantoprazole (PROTONIX) EC tablet 40 mg, 40 mg, Oral, Daily, Kristeen Miss, MD, 40 mg at 04/26/19 0906 .  polyethylene glycol (MIRALAX / GLYCOLAX) packet 17 g, 17 g, Oral, Daily PRN, Kristeen Miss, MD, 17 g at 04/24/19 1002 .  polyvinyl alcohol (LIQUIFILM TEARS) 1.4 % ophthalmic solution 1 drop, 1 drop, Both Eyes, PRN, Margot Oriordan, Joyice Faster, MD .  senna (SENOKOT) tablet 8.6 mg, 1 tablet, Oral, BID, Kristeen Miss, MD, Stopped at 04/25/19 2247 .  sodium chloride flush (NS) 0.9 % injection 3 mL, 3 mL, Intravenous, Q12H, Kristeen Miss, MD, 3 mL at 04/26/19 0914 .  sodium chloride flush (NS) 0.9 % injection 3 mL, 3 mL, Intravenous, PRN, Kristeen Miss, MD .  sodium chloride flush (NS) 0.9 % injection 3 mL, 3 mL, Intravenous, Q12H, Kristeen Miss, MD, 3 mL at 04/26/19 0914 .  sodium chloride flush (NS) 0.9 % injection 3 mL, 3 mL, Intravenous, PRN, Kristeen Miss, MD .  sodium phosphate (FLEET) 7-19 GM/118ML enema 1 enema, 1 enema, Rectal, Once PRN, Kristeen Miss, MD   Physical Exam: AOx3, PERRL, EOMI, FS, Strength diffusely 4+/5 in BLE, thoracic sensory level Incision c/d/i  Assessment & Plan: 58 y.o. 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  -continue PT/OT, CIR transfer pending -will monitor mood to see if he would benefit from an SNRI -SCDs/TEDs, SQH  Judith Part  04/26/19 10:37 AM

## 2019-04-26 NOTE — Progress Notes (Signed)
Patient arrived to unit from [facility] via wheelchair at [facility]. No complaints of pain at this time.

## 2019-04-26 NOTE — Progress Notes (Signed)
Occupational Therapy Treatment Patient Details Name: Paul Thornton MRN: [id number] DOB: Aug 27, 1961 Today's Date: 04/26/2019    History of present illness This 58 y.o. male admitted with progressive back and chest wall pain as well as LE weakness and falls.  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   He underwent resection of tumor, laminectomy and decompression of T3-5 with fusion from T2-6.  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  No cranial metastatic dissease seen.  PMH includes COPD    OT comments  Pt performing LB dressing with figure 4 technique with set-upA. Pt grooming at sink with set-upA to minguardA for stability- pt requiring 1 UE supported as pt LOB without 1 UE supported. Pt transferring onto/off of commode with minguardA. Pt with decreased mobility and poor dorsiflexion for mobility using RW. Pt with poor strength in BLEs, but no LOB episodes. Pt tolerating session well. Pt would greatly benefit from continued OT skilled services for ADL, energy conservation and safety in CIR. OT following acutely.      Follow Up Recommendations  CIR;Supervision/Assistance - 24 hour    Equipment Recommendations  None recommended by OT    Recommendations for Other Services      Precautions / Restrictions Precautions Precautions: Fall;Back Required Braces or Orthoses: Spinal Brace Spinal Brace: Thoracolumbosacral orthotic;Applied in sitting position Restrictions Weight Bearing Restrictions: No       Mobility Bed Mobility Overal bed mobility: Needs Assistance             General bed mobility comments: Pt was OOB in the recliner chair.   Transfers Overall transfer level: Needs assistance Equipment used: Rolling walker (2 wheeled) Transfers: Sit  to/from Stand Sit to Stand: Min guard         General transfer comment: minguardA for initial standing balance    Balance Overall balance assessment: Needs assistance Sitting-balance support: Feet supported;Bilateral upper extremity supported Sitting balance-Leahy Scale: Fair     Standing balance support: Bilateral upper extremity supported Standing balance-Leahy Scale: Poor Standing balance comment: needs external support in standing.                High Level Balance Comments: able to maneuver and open door with backward stepping while opening door with minA for support.           ADL either performed or assessed with clinical judgement   ADL Overall ADL's : Needs assistance/impaired Eating/Feeding: Set up;Sitting   Grooming: Wash/dry hands;Wash/dry face;Oral care;Min guard;Standing               Lower Body Dressing: Minimal assistance;Sitting/lateral leans;Sit to/from stand Lower Body Dressing Details (indicate cue type and reason): figure 4 technique  Toilet Transfer: Min guard;Ambulation;RW           Functional mobility during ADLs: Minimal assistance;Rolling walker;Cueing for safety General ADL Comments: set-upA for UB ADL and minA for LB ADL.     Vision   Vision Assessment?: No apparent visual deficits   Perception     Praxis      Cognition Arousal/Alertness: Awake/alert Behavior During Therapy: WFL for tasks assessed/performed Overall Cognitive Status: Within Functional Limits for tasks assessed  General Comments: Not specifically tested, conversation normal, good carry over of learned activities.  He did not remember meeting me in the ICU (but this would still be normal)        Exercises Exercises: Other exercises Other Exercises Other Exercises: Pt reports compliance with ankle pumps, LAQs and seated marches throughout the day.    Shoulder Instructions       General Comments       Pertinent Vitals/ Pain       Pain Assessment: Faces Faces Pain Scale: Hurts a little bit Pain Location: back, incisional Pain Descriptors / Indicators: Grimacing;Discomfort Pain Intervention(s): Limited activity within patient's tolerance;Premedicated before session;Monitored during session  Home Living                                          Prior Functioning/Environment              Frequency  Min 2X/week        Progress Toward Goals  OT Goals(current goals can now be found in the care plan section)  Progress towards OT goals: Progressing toward goals  Acute Rehab OT Goals Patient Stated Goal: to go fishing again OT Goal Formulation: With patient Time For Goal Achievement: 05/05/19 Potential to Achieve Goals: Good ADL Goals Pt Will Perform Lower Body Bathing: with min assist;with adaptive equipment;sit to/from stand Pt Will Perform Lower Body Dressing: with min assist;sit to/from stand;with adaptive equipment Pt Will Transfer to Toilet: with min assist;ambulating;regular height toilet;bedside commode;grab bars Pt Will Perform Toileting - Clothing Manipulation and hygiene: with min assist;sit to/from stand Additional ADL Goal #1: Pt will recall events of day with use of external cues and min A  Plan Discharge plan remains appropriate    Co-evaluation                 AM-PAC OT "6 Clicks" Daily Activity     Outcome Measure   Help from another person eating meals?: None Help from another person taking care of personal grooming?: A Little Help from another person toileting, which includes using toliet, bedpan, or urinal?: A Little Help from another person bathing (including washing, rinsing, drying)?: A Little Help from another person to put on and taking off regular upper body clothing?: A Little Help from another person to put on and taking off regular lower body clothing?: A Little 6 Click Score: 19    End of Session Equipment  Utilized During Treatment: Gait belt;Rolling walker;Back brace  OT Visit Diagnosis: Unsteadiness on feet (R26.81);Muscle weakness (generalized) (M62.81);Pain   Activity Tolerance Patient tolerated treatment well   Patient Left in chair;with call bell/phone within reach;with chair alarm set   Nurse Communication Mobility status        Time: 1007-1219 OT Time Calculation (min): 15 min  Charges: OT General Charges $OT Visit: 1 Visit OT Treatments $Self Care/Home Management : 8-22 mins  Darryl Nestle) Marsa Aris OTR/L Acute Rehabilitation Services Pager: [phone] Office: [phone]   Jenene Slicker Aslin Farinas 04/26/2019, 3:09 PM

## 2019-04-26 NOTE — Progress Notes (Addendum)
Nutrition Follow-up   DOCUMENTATION CODES:   Severe malnutrition in context of chronic illness, Underweight  INTERVENTION:  Provide Ensure Enlive po TID, each supplement provides 350 kcal and 20 grams of protein  Encourage adequate PO intake.   NUTRITION DIAGNOSIS:   Severe Malnutrition related to cancer and cancer related treatments as evidenced by severe fat depletion, severe muscle depletion  GOAL:   Patient will meet greater than or equal to 90% of their needs; met  MONITOR:   PO intake, Supplement acceptance, Skin, Weight trends, Labs, I & O's  REASON FOR ASSESSMENT:   Consult Assessment of nutrition requirement/status  ASSESSMENT:   Pt with PMH of tobacco abuse, COPD, GERD admitted to [facility] with cough, weight loss and leg weakness. He was found to have a RUL mass with mets to the ribs and spine.  Pt is currently on a regular diet with thin liquids. Meal completion has been 75-100%. Pt reports having a good appetite currently and PTA with usual consumption of at least 2-3 meals a day. Usual body weight reported to be ~130-140 lbs. RD to order nutritional supplements to aid in increased caloric and protein needs. Labs and medications reviewed.   NUTRITION - FOCUSED PHYSICAL EXAM:    Most Recent Value  Orbital Region  Unable to assess  Upper Arm Region  Severe depletion  Thoracic and Lumbar Region  Unable to assess  Buccal Region  Unable to assess  Temple Region  Unable to assess  Clavicle Bone Region  Severe depletion  Clavicle and Acromion Bone Region  Severe depletion  Scapular Bone Region  Severe depletion  Dorsal Hand  Unable to assess  Patellar Region  Severe depletion  Anterior Thigh Region  Severe depletion  Posterior Calf Region  Severe depletion  Edema (RD Assessment)  None  Hair  Reviewed  Eyes  Reviewed  Mouth  Reviewed  Skin  Reviewed  Nails  Reviewed       Diet Order:   Diet Order            Diet regular Room service appropriate? Yes; Fluid  consistency: Thin  Diet effective now              EDUCATION NEEDS:   Not appropriate for education at this time  Skin:  Skin Assessment: Reviewed RN Assessment  Last BM:  7/1  Height:   Ht Readings from Last 1 Encounters:  04/21/19 5' 9"  (1.753 m)    Weight:   Wt Readings from Last 1 Encounters:  04/21/19 46.8 kg    Ideal Body Weight:  73 kg  BMI:  Body mass index is 15.24 kg/m.  Estimated Nutritional Needs:   Kcal:  1600-1850  Protein:  74-82 gr  Fluid:  >/= 1.6 L/day    Corrin Parker, MS, RD, LDN Pager # [phone] After hours/ weekend pager # [phone]

## 2019-04-26 NOTE — Progress Notes (Signed)
Inpatient Rehabilitation-Admissions Coordinator   [facility] met with pt who states the financial counselor did answer all his questions. It appears as though an application has been stated for Medicaid. AC contacted pt's sister per his request to review all information regarding cost and program details. All her questions answered via phone. The pt and his sister would like to discuss all the information again tonight and will have a decision tomorrow.   AC will follow up tomorrow.   Jhonnie Garner, OTR/L  Rehab Admissions Coordinator  [phone] 04/26/2019 1:09 PM

## 2019-04-26 NOTE — H&P (Signed)
Physical Medicine and Rehabilitation Admission H&P    Chief Complaint  Patient presents with  . 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     HPI:  Paul Thornton is a 58 year old male with history of COPD, tobacco abuse who was admitted via ED on 04/19/2019 with 5-day history of wheezing, cough, chest wall pain, bilateral lower extremity weakness.  History taken from chart review and patient.  He revealed a 20-month history of weight loss.  Work up revealed elevated d-dimer, tachypneia with mild hypoxic and audible wheezing. CT chest done for work up and revealed large 7.2 X 6.2 X 6.9 cm RUL neoplasm invading adjacent paratracheal region on right as well as right posterior pleura, right 4 th and 5th ribs as T4 and T5 vertebral bodies with marked collapse of T4 vertebral body with invasion of T5 elements on the right. Incidental findings of extensive emphysematous changes and hepatic steatosis noted. He was started on doxycycline due to concerns of COPD exacerbation/bronchitis. IR consulted biopsy of right lung mass and thoracic tumor which was positive for adenocarcinoma.   MRI brain done, reviewed, showing right frontal infarct.  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  Neurology consulted for input and recommended full work-up to rule out embolic source.  BLE Dopplers done for work-up and negative for DVT.  TCD negative for HITS.   2D echo with ejection fraction of 55-60%.  Aspirin recommended for secondary stroke prevention and stroke felt to be due to atherosclerosis versus cancer related hypercoagulability.  Dr. Ellene Route consulted for input and patient underwent laminectomy with decompression of thoracic spine from T3-T5 and vertebrectomies T3-T4 with iliac crest allograft and fixation on 6/26.  Postprocedure tolerated  extubation without difficulty and has had improvement in BLE strength. Has been treated with IV steroids from 6/26- 6/30.  Nursing swallow evaluation showed no signs of dysphagia dysphagia. Steroids discontinued today and  His case was presented at CNS tumor board on 6/29 and Dr. Mohamed/lung cancer team to follow with input.  Therapy ongoing and patient continues to be limited by bilateral lower extremity weakness with unsteady gait. CIR recommended due to functional deficits.  Please see preadmission assessment from earlier today as well.   Review of Systems  Constitutional: Negative for chills and fever.  HENT: Negative for hearing loss and tinnitus.   Eyes: Negative for blurred vision and double vision.  Respiratory: Positive for cough and shortness of breath.   Cardiovascular: Negative for chest pain and palpitations.  Gastrointestinal: Positive for abdominal pain, diarrhea and nausea. Negative for heartburn.  Musculoskeletal: Positive for back pain and myalgias.  Skin: Negative for itching and rash.  Neurological: Positive for focal weakness (LLE>RLE).  Psychiatric/Behavioral: The patient is nervous/anxious and has insomnia.   All other systems reviewed and are negative.     Past Medical History:  Diagnosis Date  . Bronchitis   . COPD (chronic obstructive pulmonary disease) ([place])     Past Surgical History:  Procedure Laterality Date  . POSTERIOR LUMBAR FUSION 4 LEVEL N/A 04/20/2019   Procedure: POSTERIOR THORACIC FUSION 5 LEVEL;  Surgeon: Kristeen Miss, MD;  Location: [facility];  Service: Neurosurgery;  Laterality: N/A;  POSTERIOR THORACIC FUSION 5 LEVEL    Family History  Problem Relation Age of Onset  . High blood pressure Mother   . Lung cancer Father  Social History:  Lives with mother. Independent PTA. He  reports that he has been smoking. He has been smoking about one pack per day. He has never used smokeless tobacco. He reports that he does not drink alcohol or use  drugs.   Allergies  Allergen Reactions  . Amoxicillin Hives    All "cillins"  . Penicillins Hives    Did it involve swelling of the face/tongue/throat, SOB, or low BP? No Did it involve sudden or severe rash/hives, skin peeling, or any reaction on the inside of your mouth or nose? No Did you need to seek medical attention at a hospital or doctor's office? No When did it last happen? If all above answers are "NO", may proceed with cephalosporin use.    No medications prior to admission.    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  Sisters are taking care of her now   Functional History: Prior Function Level of Independence: Independent Comments: Pt enjoys fishing.  Pt independent in ADLs, drives, community ambulator(takes mother to her appointments, shops for her)  Functional Status:  Mobility: Bed Mobility Overal bed mobility: Needs Assistance Bed Mobility: Rolling, Sidelying to Sit Rolling: Supervision Sidelying to sit: Min guard Sit to sidelying: Min assist General bed mobility comments: Pt was OOB in the recliner chair.  Transfers Overall transfer level: Needs assistance Equipment used: Rolling walker (2 wheeled) Transfers: Sit to/from Stand Sit to Stand: Min guard Stand pivot transfers: +2 physical assistance, Mod assist General transfer comment: [email or web address] for initial standing balance Ambulation/Gait Ambulation/Gait assistance: Mod assist Gait Distance (Feet): 100 Feet Assistive device: Rolling walker (2 wheeled)  Gait Pattern/deviations: Decreased stance time - left, Decreased stride length, Scissoring, Steppage, Decreased dorsiflexion - left, Step-through pattern General Gait Details: Pt did better keeping his feet apart today, slowing his speed and keeping turns wide at his BOS.  As he fatigued his steppage pattern increased and so did the hyperextension moments at both knees during stance.  Gait velocity: decreased Gait velocity interpretation: 1.31 - 2.62 ft/sec, indicative of limited community ambulator    ADL: ADL Overall ADL's : Needs assistance/impaired Eating/Feeding: Set up, Sitting Grooming: Wash/dry hands, Wash/dry face, Oral care, Min guard, Standing Upper Body Bathing: Minimal assistance, Sitting Lower Body Bathing: Maximal assistance, Sit to/from stand Upper Body Dressing : Sitting, Moderate assistance Lower Body Dressing: Minimal assistance, Sitting/lateral leans, Sit to/from stand Lower Body Dressing Details (indicate cue type and reason): figure 4 technique  Toilet Transfer: Min guard, Ambulation, RW Toilet Transfer Details (indicate cue type and reason): simulated Toileting- Clothing Manipulation and Hygiene: Total assistance, Sit to/from stand Functional mobility during ADLs: Minimal assistance, Rolling walker, Cueing for safety General ADL Comments: set-upA for UB ADL and minA for LB ADL.  Cognition: Cognition Overall Cognitive Status: Within Functional Limits for tasks assessed Orientation Level: Oriented X4 Cognition Arousal/Alertness: Awake/alert Behavior During Therapy: WFL for tasks assessed/performed Overall Cognitive Status: Within Functional Limits for tasks assessed Area of Impairment: Memory Orientation Level: Disoriented to, Time Current Attention Level: Selective Memory: Decreased short-term memory Awareness: Intellectual Problem Solving: Requires verbal cues General Comments: Not specifically tested, conversation normal, good carry over  of learned  activities.  He did not remember meeting me in the ICU (but this would still be normal)   Blood pressure (!) 89/65, pulse 81, temperature 97.6 F (36.4 C), temperature source Oral, resp. rate 18, height 5\' 9"  (1.753 m), weight 46.8 kg, SpO2 97 %. Physical Exam  Nursing note and vitals reviewed. Constitutional: He is oriented to person, place, and time. He appears well-developed. He appears cachectic. No distress.  Cachectic  HENT:  Head: Normocephalic and atraumatic.  Eyes: EOM are normal.  Respiratory: No stridor. No respiratory distress. He has no wheezes.  GI: He exhibits distension. Bowel sounds are decreased. There is no rebound.  Musculoskeletal:     Comments: No edema or tenderness in extremities  Neurological: He is alert and oriented to person, place, and time.  Motor: Bilateral upper extremities: 5/5 proximal distal Right lower extremity: 5/5 proximal and distal Left lower extremity: 4/5 proximal to distal  Skin: Skin is warm.  Thoracic incision C/D/I with staples in place.   Psychiatric: He has a normal mood and affect. His behavior is normal.    Results for orders placed or performed during the hospital encounter of 04/19/19 (from the past 48 hour(s))  Glucose, capillary     Status: Abnormal   Collection Time: 04/24/19  8:00 PM  Result Value Ref Range   Glucose-Capillary 107 (H) 70 - 99 mg/dL  Glucose, capillary     Status: Abnormal   Collection Time: 04/25/19 12:27 AM  Result Value Ref Range   Glucose-Capillary 153 (H) 70 - 99 mg/dL  Glucose, capillary     Status: Abnormal   Collection Time: 04/25/19  4:09 AM  Result Value Ref Range   Glucose-Capillary 157 (H) 70 - 99 mg/dL  Glucose, capillary     Status: None   Collection Time: 04/25/19  8:14 AM  Result Value Ref Range   Glucose-Capillary 89 70 - 99 mg/dL   Comment 1 Notify RN    Comment 2 Document in Chart   Glucose, capillary     Status: Abnormal   Collection Time: 04/25/19 11:51 AM  Result Value Ref  Range   Glucose-Capillary 110 (H) 70 - 99 mg/dL   Comment 1 Notify RN    Comment 2 Document in Chart   Glucose, capillary     Status: Abnormal   Collection Time: 04/25/19  5:24 PM  Result Value Ref Range   Glucose-Capillary 130 (H) 70 - 99 mg/dL  Glucose, capillary     Status: None   Collection Time: 04/25/19  7:43 PM  Result Value Ref Range   Glucose-Capillary 81 70 - 99 mg/dL   Comment 1 Notify RN    Comment 2 Document in Chart   Glucose, capillary     Status: None   Collection Time: 04/26/19 12:24 AM  Result Value Ref Range   Glucose-Capillary 94 70 - 99 mg/dL   Comment 1 Notify RN    Comment 2 Document in Chart   Glucose, capillary     Status: None   Collection Time: 04/26/19  4:19 AM  Result Value Ref Range   Glucose-Capillary 80 70 - 99 mg/dL   Comment 1 Notify RN    Comment 2 Document in Chart   Glucose, capillary     Status: Abnormal   Collection Time: 04/26/19  8:59 AM  Result Value Ref Range   Glucose-Capillary 103 (H) 70 - 99 mg/dL   Comment 1 Notify RN    Comment 2 Document in Chart  Glucose, capillary     Status: None   Collection Time: 04/26/19 11:52 AM  Result Value Ref Range   Glucose-Capillary 82 70 - 99 mg/dL   Comment 1 Notify RN    Comment 2 Document in Chart   Glucose, capillary     Status: Abnormal   Collection Time: 04/26/19  4:13 PM  Result Value Ref Range   Glucose-Capillary 115 (H) 70 - 99 mg/dL   Comment 1 Notify RN    Comment 2 Document in Chart    No results found.     Medical Problem List and Plan: 1.  Deficits with mobility, endurance, self-care, balance secondary to right MCA infarct superimposed on lung CA.  Admit to CIR 2.  Antithrombotics: -DVT/anticoagulation:  Pharmaceutical: Heparin  -antiplatelet therapy: ASA 3. Pain Management:  Hydrocodone prn 4. Mood: Team to provide ego support.  Will add xanax prn anxiety.   -antipsychotic agents: N/A 5. Neuropsych: This patient is capable of making decisions on his own behalf.  6. Skin/Wound Care: Routine pressure relief measures. Monitor wound daily.  7. Fluids/Electrolytes/Nutrition: Monitor I/O.  CMP ordered for tomorrow a.m. 8. Lung cancer with bony mets: Follow-up heme/Onc recs. 9. Abdominal pain: Worse since admission and has had diarrhea since AM contributing to anxiety. Will order KUB as well as enema--question need for disimpaction.  10.  Leucocytosis: Monitor for signs of infection.  CBC ordered for tomorrow a.m. 11. Hypotension:  Monitor BP tid. Monitor for orthostatic symptoms--SBP occasionally in 80's. Monitor with increased mobility 12. COPD with SOB:  Continue Pulmicort nebs. D/ced doxycyline as completed Day #8 for question of bronchitis v/s exacerbation.  13. Diarrhea: D/c Senna and colace for now.  Enema today due to reports of constipation PTA and worsening abdominal pain.    Bary Leriche, PA-C 04/26/2019

## 2019-04-26 NOTE — Progress Notes (Signed)
Inpatient Rehabilitation-Admissions Coordinator   Pt has agreed to pursue CIR. Dr. Zada Finders has provided medical approval for admit to CIR today. AC has updated pt, his family, RN, and CM/SW regarding plan.   Please call if questions.   Jhonnie Garner, OTR/L  Rehab Admissions Coordinator  [phone] 04/26/2019 4:01 PM

## 2019-04-26 NOTE — TOC Transition Note (Signed)
Transition of Care [facility]) - CM/SW Discharge Note   Patient Details  Name: Paul Thornton MRN: [id number] Date of Birth: 01/24/1961  [facility] CM/SW Contact:  Pollie Friar, RN Phone Number: 04/26/2019, 5:20 PM   Clinical Narrative:    Pt is discharging to CIR today. CM signing off.   Final next level of care: IP Rehab Facility Barriers to Discharge: Inadequate or no insurance, Barriers Unresolved (comment)   Patient Goals and CMS Choice Patient states their goals for this hospitalization and ongoing recovery are:: "Get back home." CMS Medicare.gov Compare Post Acute Care list provided to:: Patient Choice offered to / list presented to : Patient  Discharge Placement                       Discharge Plan and Services                                     Social Determinants of Health (SDOH) Interventions     Readmission Risk Interventions No flowsheet data found.

## 2019-04-26 NOTE — H&P (Addendum)
Physical Medicine and Rehabilitation Admission H&P    Chief Complaint  Patient presents with   Functional deficits due to stroke and cord compression from metastatic cancer.     HPI:  Paul Thornton is a 58 year old male with history of COPD, tobacco abuse who was admitted via ED on 04/19/2019 with 5-day history of wheezing, cough, chest wall pain, bilateral lower extremity weakness.  History taken from chart review and patient.  He revealed a 71-month history of weight loss.  Work up revealed elevated d-dimer, tachypneia with mild hypoxic and audible wheezing. CT chest done for work up and revealed large 7.2 X 6.2 X 6.9 cm RUL neoplasm invading adjacent paratracheal region on right as well as right posterior pleura, right 4 th and 5th ribs as T4 and T5 vertebral bodies with marked collapse of T4 vertebral body with invasion of T5 elements on the right. Incidental findings of extensive emphysematous changes and hepatic steatosis noted. He was started on doxycycline due to concerns of COPD exacerbation/bronchitis. IR consulted biopsy of right lung mass and thoracic tumor which was positive for adenocarcinoma.   MRI brain done, reviewed, showing right frontal infarct.  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  Neurology consulted for input and recommended full work-up to rule out embolic source.  BLE Dopplers done for work-up and negative for DVT.  TCD negative for HITS.   2D echo with ejection fraction of 55-60%.  Aspirin recommended for secondary stroke prevention and stroke felt to be due to atherosclerosis versus cancer related hypercoagulability.  Dr. Ellene Route consulted for input and patient underwent laminectomy with decompression of thoracic spine from T3-T5 and vertebrectomies T3-T4 with iliac crest allograft and fixation on 6/26.  Postprocedure  tolerated extubation without difficulty and has had improvement in BLE strength. Has been treated with IV steroids from 6/26- 6/30.  Nursing swallow evaluation showed no signs of dysphagia dysphagia. Steroids discontinued today and  His case was presented at CNS tumor board on 6/29 and Dr. Mohamed/lung cancer team to follow with input.  Therapy ongoing and patient continues to be limited by bilateral lower extremity weakness with unsteady gait. CIR recommended due to functional deficits.  Please see preadmission assessment from earlier today as well.   Review of Systems  Constitutional: Negative for chills and fever.  HENT: Negative for hearing loss and tinnitus.   Eyes: Negative for blurred vision and double vision.  Respiratory: Positive for cough and shortness of breath.   Cardiovascular: Negative for chest pain and palpitations.  Gastrointestinal: Positive for abdominal pain, diarrhea and nausea. Negative for heartburn.  Musculoskeletal: Positive for back pain and myalgias.  Skin: Negative for itching and rash.  Neurological: Positive for focal weakness (LLE>RLE).  Psychiatric/Behavioral: The patient is nervous/anxious and has insomnia.   All other systems reviewed and are negative.     Past Medical History:  Diagnosis Date   Bronchitis    COPD (chronic obstructive pulmonary disease) ([place])     Past Surgical History:  Procedure Laterality Date   POSTERIOR LUMBAR FUSION 4 LEVEL N/A 04/20/2019   Procedure: POSTERIOR THORACIC FUSION 5 LEVEL;  Surgeon: Kristeen Miss, MD;  Location: [facility];  Service: Neurosurgery;  Laterality: N/A;  POSTERIOR THORACIC FUSION 5 LEVEL    Family History  Problem Relation Age of Onset   High blood pressure Mother    Lung cancer  Father      Social History:  Lives with mother. Independent PTA. He  reports that he has been smoking. He has been smoking about one pack per day. He has never used smokeless tobacco. He reports that he does not drink alcohol  or use drugs.   Allergies  Allergen Reactions   Amoxicillin Hives    All "cillins"   Penicillins Hives    Did it involve swelling of the face/tongue/throat, SOB, or low BP? No Did it involve sudden or severe rash/hives, skin peeling, or any reaction on the inside of your mouth or nose? No Did you need to seek medical attention at a hospital or doctor's office? No When did it last happen? If all above answers are NO, may proceed with cephalosporin use.    No medications prior to admission.    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  Sisters are taking care of her now   Functional History: Prior Function Level of Independence: Independent Comments: Pt enjoys fishing.  Pt independent in ADLs, drives, community ambulator(takes mother to her appointments, shops for her)  Functional Status:  Mobility: Bed Mobility Overal bed mobility: Needs Assistance Bed Mobility: Rolling, Sidelying to Sit Rolling: Supervision Sidelying to sit: Min guard Sit to sidelying: Min assist General bed mobility comments: Pt was OOB in the recliner chair.  Transfers Overall transfer level: Needs assistance Equipment used: Rolling walker (2 wheeled) Transfers: Sit to/from Stand Sit to Stand: Min guard Stand pivot transfers: +2 physical assistance, Mod assist General transfer comment: [email or web address] for initial standing balance Ambulation/Gait Ambulation/Gait assistance: Mod assist Gait Distance (Feet): 100 Feet Assistive device: Rolling walker (2  wheeled) Gait Pattern/deviations: Decreased stance time - left, Decreased stride length, Scissoring, Steppage, Decreased dorsiflexion - left, Step-through pattern General Gait Details: Pt did better keeping his feet apart today, slowing his speed and keeping turns wide at his BOS.  As he fatigued his steppage pattern increased and so did the hyperextension moments at both knees during stance.  Gait velocity: decreased Gait velocity interpretation: 1.31 - 2.62 ft/sec, indicative of limited community ambulator    ADL: ADL Overall ADL's : Needs assistance/impaired Eating/Feeding: Set up, Sitting Grooming: Wash/dry hands, Wash/dry face, Oral care, Min guard, Standing Upper Body Bathing: Minimal assistance, Sitting Lower Body Bathing: Maximal assistance, Sit to/from stand Upper Body Dressing : Sitting, Moderate assistance Lower Body Dressing: Minimal assistance, Sitting/lateral leans, Sit to/from stand Lower Body Dressing Details (indicate cue type and reason): figure 4 technique  Toilet Transfer: Min guard, Ambulation, RW Toilet Transfer Details (indicate cue type and reason): simulated Toileting- Clothing Manipulation and Hygiene: Total assistance, Sit to/from stand Functional mobility during ADLs: Minimal assistance, Rolling walker, Cueing for safety General ADL Comments: set-upA for UB ADL and minA for LB ADL.  Cognition: Cognition Overall Cognitive Status: Within Functional Limits for tasks assessed Orientation Level: Oriented X4 Cognition Arousal/Alertness: Awake/alert Behavior During Therapy: WFL for tasks assessed/performed Overall Cognitive Status: Within Functional Limits for tasks assessed Area of Impairment: Memory Orientation Level: Disoriented to, Time Current Attention Level: Selective Memory: Decreased short-term memory Awareness: Intellectual Problem Solving: Requires verbal cues General Comments: Not specifically  tested, conversation normal, good carry over of  learned activities.  He did not remember meeting me in the ICU (but this would still be normal)   Blood pressure (!) 89/65, pulse 81, temperature 97.6 F (36.4 C), temperature source Oral, resp. rate 18, height 5\' 9"  (1.753 m), weight 46.8 kg, SpO2 97 %. Physical Exam  Nursing note and vitals reviewed. Constitutional: He is oriented to person, place, and time. He appears well-developed. He appears cachectic. No distress.  Cachectic  HENT:  Head: Normocephalic and atraumatic.  Eyes: EOM are normal.  Respiratory: No stridor. No respiratory distress. He has no wheezes.  GI: He exhibits distension. Bowel sounds are decreased. There is no rebound.  Musculoskeletal:     Comments: No edema or tenderness in extremities  Neurological: He is alert and oriented to person, place, and time.  Motor: Bilateral upper extremities: 5/5 proximal distal Right lower extremity: 5/5 proximal and distal Left lower extremity: 4/5 proximal to distal  Skin: Skin is warm.  Thoracic incision C/D/I with staples in place.   Psychiatric: He has a normal mood and affect. His behavior is normal.    Results for orders placed or performed during the hospital encounter of 04/19/19 (from the past 48 hour(s))  Glucose, capillary     Status: Abnormal   Collection Time: 04/24/19  8:00 PM  Result Value Ref Range   Glucose-Capillary 107 (H) 70 - 99 mg/dL  Glucose, capillary     Status: Abnormal   Collection Time: 04/25/19 12:27 AM  Result Value Ref Range   Glucose-Capillary 153 (H) 70 - 99 mg/dL  Glucose, capillary     Status: Abnormal   Collection Time: 04/25/19  4:09 AM  Result Value Ref Range   Glucose-Capillary 157 (H) 70 - 99 mg/dL  Glucose, capillary     Status: None   Collection Time: 04/25/19  8:14 AM  Result Value Ref Range   Glucose-Capillary 89 70 - 99 mg/dL   Comment 1 Notify RN    Comment 2 Document in Chart   Glucose, capillary     Status: Abnormal   Collection Time: 04/25/19 11:51 AM  Result Value  Ref Range   Glucose-Capillary 110 (H) 70 - 99 mg/dL   Comment 1 Notify RN    Comment 2 Document in Chart   Glucose, capillary     Status: Abnormal   Collection Time: 04/25/19  5:24 PM  Result Value Ref Range   Glucose-Capillary 130 (H) 70 - 99 mg/dL  Glucose, capillary     Status: None   Collection Time: 04/25/19  7:43 PM  Result Value Ref Range   Glucose-Capillary 81 70 - 99 mg/dL   Comment 1 Notify RN    Comment 2 Document in Chart   Glucose, capillary     Status: None   Collection Time: 04/26/19 12:24 AM  Result Value Ref Range   Glucose-Capillary 94 70 - 99 mg/dL   Comment 1 Notify RN    Comment 2 Document in Chart   Glucose, capillary     Status: None   Collection Time: 04/26/19  4:19 AM  Result Value Ref Range   Glucose-Capillary 80 70 - 99 mg/dL   Comment 1 Notify RN    Comment 2 Document in Chart   Glucose, capillary     Status: Abnormal   Collection Time: 04/26/19  8:59 AM  Result Value Ref Range   Glucose-Capillary 103 (H) 70 - 99 mg/dL   Comment 1 Notify RN  Comment 2 Document in Chart   Glucose, capillary     Status: None   Collection Time: 04/26/19 11:52 AM  Result Value Ref Range   Glucose-Capillary 82 70 - 99 mg/dL   Comment 1 Notify RN    Comment 2 Document in Chart   Glucose, capillary     Status: Abnormal   Collection Time: 04/26/19  4:13 PM  Result Value Ref Range   Glucose-Capillary 115 (H) 70 - 99 mg/dL   Comment 1 Notify RN    Comment 2 Document in Chart    No results found.     Medical Problem List and Plan: 1.  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  Admit to CIR 2.  Antithrombotics: -DVT/anticoagulation:  Pharmaceutical: Heparin  -antiplatelet therapy: ASA 3. Pain Management:  Hydrocodone prn 4. Mood: Team to provide ego support.  Will add xanax prn anxiety.   -antipsychotic agents: N/A 5. Neuropsych: This patient is capable  of making decisions on his own behalf. 6. Skin/Wound Care: Routine pressure relief measures. Monitor wound daily.  7. Fluids/Electrolytes/Nutrition: Monitor I/O.  CMP ordered for tomorrow a.m. 8. Lung cancer with bony mets: Follow-up heme/Onc recs. 9. Abdominal pain: Worse since admission and has had diarrhea since AM contributing to anxiety. Will order KUB as well as enema--question need for disimpaction.  10.  Leucocytosis: Monitor for signs of infection.  CBC ordered for tomorrow a.m. 11. Hypotension:  Monitor BP tid. Monitor for orthostatic symptoms--SBP occasionally in 80's. Monitor with increased mobility 12. COPD with SOB:  Continue Pulmicort nebs. D/ced doxycyline as completed Day #8 for question of bronchitis v/s exacerbation.  13. Diarrhea: D/c Senna and colace for now.  Enema today due to reports of constipation PTA and worsening abdominal pain.   Post Admission Physician Evaluation: 1. Preadmission assessment reviewed and changes made below. 2. 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 3. Patient is admitted to receive collaborative, interdisciplinary care between the physiatrist, rehab nursing staff, and therapy team. 4. Patient's level of medical complexity and substantial therapy needs in context of that medical necessity cannot be provided at a lesser intensity of care such as a SNF. 5. Patient has experienced substantial functional loss from his/her baseline which was documented above under the "Functional History" and "Functional Status" headings.  Judging by the patient's diagnosis, physical exam, and functional history, the patient has potential for functional progress which will result in measurable gains while on inpatient rehab.  These gains will be of substantial and practical use upon discharge  in facilitating mobility and self-care at the household level. 6. Physiatrist will provide 24 hour management  of medical needs as well as oversight of the therapy plan/treatment and provide guidance as appropriate regarding the interaction of the two. 7. 24 hour rehab nursing will assist with bowel management, safety, skin/wound care, disease management, pain management and patient education  and help integrate therapy concepts, techniques,education, etc. 8. PT will assess and treat for/with: Lower extremity strength, range of motion, stamina, balance, functional mobility, safety, adaptive techniques and equipment, wound care, coping skills, pain control, education. Goals are: Supervision/mod I. 9. OT will assess and treat for/with: ADL's, functional mobility, safety, upper extremity strength, adaptive techniques and equipment, wound mgt, ego support, and community reintegration.   Goals are: Supervision/mod I. Therapy may not proceed with showering this patient. 10. Case Management  and Education officer, museum will assess and treat for psychological issues and discharge planning. 11. Team conference will be held weekly to assess progress toward goals and to determine barriers to discharge. 12. Patient will receive at least 3 hours of therapy per day at least 5 days per week. 13. ELOS: 10-15 days.       14. Prognosis:  fair  I have personally performed a face to face diagnostic evaluation, including, but not limited to relevant history and physical exam findings, of this patient and developed relevant assessment and plan.  Additionally, I have reviewed and concur with the physician assistant's documentation above.  Delice Lesch, MD, ABPMR Bary Leriche, PA-C 04/26/2019

## 2019-04-26 NOTE — IPOC Note (Signed)
Individualized overall Plan of Care [facility]) Patient Details Name: Paul Thornton MRN: [id number] DOB: 01/14/1961  Admitting Diagnosis: Right MCA infarct superimposed on lung CA  Hospital Problems: Active Problems:   Metastatic cancer to spine ([place])   Pain   Acute blood loss anemia   Hypoalbuminemia due to protein-calorie malnutrition ([facility])   Drug induced constipation     Functional Problem List: Nursing Bladder, Bowel, Endurance, Medication Management, Motor, Nutrition, Pain, Perception, Safety, Skin Integrity  PT Balance, Endurance, Motor, Skin Integrity, Safety  OT Balance, Endurance, Motor  SLP    TR         Basic ADL's: OT Grooming, Bathing, Dressing, Toileting     Advanced  ADL's: OT Simple Meal Preparation, Light Housekeeping     Transfers: PT Bed Mobility, Bed to Chair, Car, Manufacturing systems engineer, Metallurgist: PT Ambulation, Emergency planning/management officer, Stairs     Additional Impairments: OT    SLP        TR      Anticipated Outcomes Item Anticipated Outcome  Self Feeding independent  Swallowing      Basic self-care  mod I  Toileting  mod I   Bathroom Transfers mod I/ supervision  Bowel/Bladder  patient will be continent of bowel and bladder with min assist  Transfers  Supervision with AD  Locomotion  S with AD for household distances, S level for w/c in community  Communication     Cognition     Pain  pain less than or equal to 4/10 with min assist  Safety/Judgment  free from falls/injury and making appropriate safety decisions   Therapy Plan: PT Intensity: Minimum of 1-2 x/day ,45 to 90 minutes PT Frequency: 5 out of 7 days PT Duration Estimated Length of Stay: 12-14 days OT Intensity: Minimum of 1-2 x/day, 45 to 90 minutes OT Frequency: 5 out of 7 days OT Duration/Estimated Length of Stay: 12-14 days      Team Interventions: Nursing Interventions Patient/Family Education, Bladder Management, Bowel Management, Disease  Management/Prevention, Pain Management, Medication Management, Skin Care/Wound Management, Cognitive Remediation/Compensation, Discharge Planning  PT interventions Ambulation/gait training, Balance/vestibular training, Community reintegration, Discharge planning, Disease management/prevention, DME/adaptive equipment instruction, Functional mobility training, Neuromuscular re-education, Patient/family education, Psychosocial support, Skin care/wound management, Stair training, Therapeutic Activities, Splinting/orthotics, Therapeutic Exercise, UE/LE Strength taining/ROM, UE/LE Coordination activities, Wheelchair propulsion/positioning  OT Interventions Training and development officer, Self Care/advanced ADL retraining, Therapeutic Exercise, Wheelchair propulsion/positioning, DME/adaptive equipment instruction, UE/LE Strength taining/ROM, Patient/family education, Discharge planning, Functional mobility training, Therapeutic Activities  SLP Interventions    TR Interventions    SW/CM Interventions Discharge Planning, Psychosocial Support, Patient/Family Education   Barriers to Discharge MD  Medical stability and Pending chemo/radiation  Nursing      PT Medical stability decreased endurance, BLE weakness, high fall risk  OT      SLP      SW       Team Discharge Planning: Destination: PT-Home ,OT- Home , SLP-  Projected Follow-up: PT-Home health PT, Outpatient PT, OT-  Home health OT, SLP-  Projected Equipment Needs: PT-Rolling walker with 5" wheels, Wheelchair cushion (measurements), OT- Tub/shower bench, To be determined, SLP-  Equipment Details: PT-would need 16x16 w/c with ROHO cushion, OT-  Patient/family involved in discharge planning: PT- Patient,  OT-Patient, SLP-   MD ELOS: 10-14 days. Medical Rehab Prognosis:  Fair Assessment: 58 year old male with history of COPD, tobacco abuse who was admitted via ED on 04/19/2019 with 5-day history of wheezing, cough,  chest wall pain, bilateral lower  extremity weakness.  History taken from chart review and patient.  He revealed a 11-month history of weight loss.  Work up revealed elevated d-dimer, tachypneia with mild hypoxic and audible wheezing. CT chest done for work up and revealed large 7.2 X 6.2 X 6.9 cm RUL neoplasm invading adjacent paratracheal region on right as well as right posterior pleura, right 4 th and 5th ribs as T4 and T5 vertebral bodies with marked collapse of T4 vertebral body with invasion of T5 elements on the right. Incidental findings of extensive emphysematous changes and hepatic steatosis noted. He was started on doxycycline due to concerns of COPD exacerbation/bronchitis. IR consulted biopsy of right lung mass and thoracic tumor which was positive for adenocarcinoma.   MRI brain done, reviewed, showing right frontal infarct.  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  Neurology consulted for input and recommended full work-up to rule out embolic source.  BLE Dopplers done for work-up and negative for DVT.  TCD negative for HITS.   2D echo with ejection fraction of 55-60%.  Aspirin recommended for secondary stroke prevention and stroke felt to be due to atherosclerosis versus cancer related hypercoagulability. Dr. Ellene Route consulted for input and patient underwent laminectomy with decompression of thoracic spine from T3-T5 and vertebrectomies T3-T4 with iliac crest allograft and fixation on 6/26.  Postprocedure tolerated extubation without difficulty and has had improvement in BLE strength. Has been treated with IV steroids from 6/26- 6/30.  Nursing swallow evaluation showed no signs of dysphagia dysphagia. Steroids discontinued and his case was presented at CNS tumor board on 6/29 and Dr. Mohamed/lung cancer team to follow with input.  Therapy ongoing and patient continues to be limited by  bilateral lower extremity weakness with unsteady gait.  We will set goals for Mod I/Supervision with PT/OT.   Due to the current state of emergency, patients may not be receiving their 3-hours of Medicare-mandated therapy.  See Team Conference Notes for weekly updates to the plan of care

## 2019-04-26 NOTE — Discharge Instructions (Signed)
Discharge Instructions  No restriction in activities, slowly increase your activity back to normal.   Your incision is closed with staples or non-absorbable suture. We will remove these in clinic in 2 weeks at your follow up appointment.   Okay to shower on the day of discharge. Use regular soap and water and try to be gentle when cleaning your incision. No need for a dressing on your incision. In the first few days after surgery, there may be some bloody drainage from your wound. If this happens, you can cover your incision with a gauze dressing to prevent it from staining your clothes or bed linens. If your incision begins to itch, rub some bacitracin or neosporin ointment on it instead of scratching it.  Follow up with Dr. Ellene Route in 2 weeks after discharge. If you do not already have a discharge appointment, please call his office at [phone] to schedule a follow up appointment. If you have any concerns or questions, please call the office and let us know.

## 2019-04-26 NOTE — Progress Notes (Signed)
Called to come assess Paul Thornton for possible seizure activity. Upon arrival, Paul Thornton was thrashing side to side with movement in all 4 extremities in an atypical uncoordinated fashion. When I spoke to him, he was able to answer questions.  I asked him to stop moving so we could check his vital signs. He stopped moving and started to get emotional and pleading for help. He stated he was afraid and asking why he was alone. We consoled him. We contacted his family over the phone for him and Murray Hodgkins RN contact NS.

## 2019-04-26 NOTE — Progress Notes (Addendum)
Jamse Arn, MD  Physician  Physical Medicine and Rehabilitation  PMR Pre-admission  Signed  Date of Service:  04/26/2019 3:14 PM      Related encounter: ED to Hosp-Admission (Current) from 04/19/2019 in [facility]      Signed         PMR Admission Coordinator Pre-Admission Assessment  Patient: Paul Thornton is an 58 y.o., male MRN: [id number] DOB: 03/14/1961 Height: 5' 9"  (175.3 cm) Weight: 46.8 kg  Insurance Information HMO:     PPO:      PCP:      IPA:      80/20:      OTHER:  PRIMARY: None (pt is uninsured)      Policy#:       Subscriber:  CM Name:       Phone#:      Fax#:  Pre-Cert#:       Employer:  Benefits:  Phone #:      Name:  Eff. Date:      Deduct:       Out of Pocket Max:       Life Max:  CIR: Pt is aware of the daily estimated cost of care ($3,500). He has been put in contact with financial counselor who has submitted an application for Medicaid.       SNF:  Outpatient:      Co-Pay:  Home Health:       Co-Pay:  DME:      Co-Pay:  Providers:  SECONDARY: None      Policy#:       Subscriber:  CM Name:       Phone#:      Fax#:  Pre-Cert#:       Employer:  Benefits:  Phone #:      Name:  Eff. Date:      Deduct:       Out of Pocket Max:       Life Max:  CIR:       SNF:  Outpatient:      Co-Pay: Home Health:       Co-Pay:  DME:      Co-Pay:   Medicaid Application Date: 7/1 MAD application faxed out by Carmelina Noun      Case Manager: Disability Application Date:       Case Worker:   The Data Collection Information Summary for patients in Inpatient Rehabilitation Facilities with attached Privacy Act Page Records was provided and verbally reviewed with: Patient  Emergency Contact Information         Contact Information    Name Relation Home Work Mobile   [place] Mother [phone]     [name]   [phone]      Current Medical History  Patient Admitting Diagnosis:  thoracic decompression and instrumented fusion for 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 Pt presented to the hospital with increased SOB, wheezing and worsening coughing spells. Pt also reported right sided chest discomfort that radiated to his back. Pt was found to be tachypneic, borderline hypoxic, and wheezing. CT of chest shows right lung mass with possible metastatic lung cancer involving the pleura, ribs, and T4-5 vertebral bodies. An MRI of the thoracic spine demonsrates a large mass in the right chest cavity eroding through the vertebrae from T3-5. At T4 and T5 there is erosion of the  pedicles and compression of the spinal cord. Due to bilatearl leg weakness secondary to spinal cord compression, pt underwent a laminectomy and decompression of the thoracic spinal canal from T3-T5 with partial greater than 70% vertebrectomies of T3 and T4. Reconstruction with iliac crest allograft strut and fixation with pedicle screws from T2-T6. 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 Pt was seen by therapies with recommendation for CIR due to functional deficits. Pt is to be admitted to CIR on 04/26/19.   Complete NIHSS TOTAL: 0  Patient's medical record from [facility] has been reviewed by the rehabilitation admission coordinator and physician.  Past Medical History      Past Medical History:  Diagnosis Date   Bronchitis    COPD (chronic obstructive pulmonary disease) ([place])     Family History   family history is not on file.  Prior Rehab/Hospitalizations Has the patient had prior rehab or hospitalizations prior to admission? No  Has the patient had major surgery during 100 days prior to admission? Yes             Current Medications  Current Facility-Administered Medications:    0.9 %  sodium chloride infusion  (Manually program via Guardrails IV Fluids), , Intravenous, Once, Kristeen Miss, MD   0.9 %  sodium chloride infusion, 250 mL, Intravenous, PRN, Kristeen Miss, MD   0.9 %  sodium chloride infusion, 250 mL, Intravenous, Continuous, Kristeen Miss, MD   acetaminophen (TYLENOL) tablet 650 mg, 650 mg, Oral, Q4H PRN, 650 mg at 04/25/19 1622 **OR** acetaminophen (TYLENOL) suppository 650 mg, 650 mg, Rectal, Q4H PRN, Kristeen Miss, MD   albuterol (PROVENTIL) (2.5 MG/3ML) 0.083% nebulizer solution 2.5 mg, 2.5 mg, Nebulization, Q3H PRN, Kristeen Miss, MD   alum & mag hydroxide-simeth (MAALOX/MYLANTA) 200-200-20 MG/5ML suspension 30 mL, 30 mL, Oral, Q6H PRN, Kristeen Miss, MD, 30 mL at 04/25/19 0841   aspirin EC tablet 325 mg, 325 mg, Oral, Daily, Rosalin Hawking, MD, 325 mg at 04/26/19 2836   bisacodyl (DULCOLAX) suppository 10 mg, 10 mg, Rectal, Daily PRN, Kristeen Miss, MD, 10 mg at 04/26/19 0305   budesonide (PULMICORT) nebulizer solution 0.5 mg, 0.5 mg, Nebulization, BID, Kristeen Miss, MD, 0.5 mg at 04/25/19 1938   docusate sodium (COLACE) capsule 100 mg, 100 mg, Oral, BID, Kristeen Miss, MD, Stopped at 04/25/19 2246   doxycycline (VIBRA-TABS) tablet 100 mg, 100 mg, Oral, Q12H, Kristeen Miss, MD, 100 mg at 04/26/19 0906   feeding supplement (ENSURE ENLIVE) (ENSURE ENLIVE) liquid 237 mL, 237 mL, Oral, TID BM, Kristeen Miss, MD, 237 mL at 04/26/19 1532   fentaNYL (SUBLIMAZE) bolus via infusion 50 mcg, 50 mcg, Intravenous, Q15 min PRN, Desai, Rahul P, PA-C   heparin injection 5,000 Units, 5,000 Units, Subcutaneous, Q8H, Elsner, Mallie Mussel, MD, 5,000 Units at 04/26/19 1533   HYDROcodone-acetaminophen (NORCO/VICODIN) 5-325 MG per tablet 1-2 tablet, 1-2 tablet, Oral, Q3H PRN, Kristeen Miss, MD, 1 tablet at 04/26/19 1533   insulin aspart (novoLOG) injection 1-3 Units, 1-3 Units, Subcutaneous, Q4H, Agarwala, Ravi, MD, 1 Units at 04/25/19 1759   ipratropium-albuterol (DUONEB) 0.5-2.5 (3) MG/3ML nebulizer  solution 3 mL, 3 mL, Nebulization, Q6H PRN, Kristeen Miss, MD, 3 mL at 04/21/19 1949   lactated ringers infusion, , Intravenous, Continuous, Kristeen Miss, MD, Stopped at 04/23/19 0529   menthol-cetylpyridinium (CEPACOL) lozenge 3 mg, 1 lozenge, Oral, PRN **OR** phenol (CHLORASEPTIC) mouth spray 1 spray, 1 spray, Mouth/Throat, PRN, Kristeen Miss, MD   midazolam (VERSED) injection  2 mg, 2 mg, Intravenous, Q15 min PRN, Desai, Rahul P, PA-C   midazolam (VERSED) injection 2 mg, 2 mg, Intravenous, Q2H PRN, Desai, Rahul P, PA-C   morphine 2 MG/ML injection 2-4 mg, 2-4 mg, Intravenous, Q2H PRN, Kristeen Miss, MD, 2 mg at 04/26/19 0907   nicotine (NICODERM CQ - dosed in mg/24 hours) patch 14 mg, 14 mg, Transdermal, Daily, Kristeen Miss, MD, 14 mg at 04/26/19 0912   ondansetron (ZOFRAN) tablet 4 mg, 4 mg, Oral, Q6H PRN **OR** ondansetron (ZOFRAN) injection 4 mg, 4 mg, Intravenous, Q6H PRN, Kristeen Miss, MD, 4 mg at 04/26/19 0549   pantoprazole (PROTONIX) EC tablet 40 mg, 40 mg, Oral, Daily, Kristeen Miss, MD, 40 mg at 04/26/19 0906   polyethylene glycol (MIRALAX / GLYCOLAX) packet 17 g, 17 g, Oral, Daily PRN, Kristeen Miss, MD, 17 g at 04/24/19 1002   polyvinyl alcohol (LIQUIFILM TEARS) 1.4 % ophthalmic solution 1 drop, 1 drop, Both Eyes, PRN, Ostergard, Joyice Faster, MD   senna (SENOKOT) tablet 8.6 mg, 1 tablet, Oral, BID, Kristeen Miss, MD, Stopped at 04/25/19 2247   sodium chloride flush (NS) 0.9 % injection 3 mL, 3 mL, Intravenous, Q12H, Kristeen Miss, MD, 3 mL at 04/26/19 0914   sodium chloride flush (NS) 0.9 % injection 3 mL, 3 mL, Intravenous, PRN, Kristeen Miss, MD   sodium chloride flush (NS) 0.9 % injection 3 mL, 3 mL, Intravenous, Q12H, Elsner, Henry, MD, 3 mL at 04/26/19 0914   sodium chloride flush (NS) 0.9 % injection 3 mL, 3 mL, Intravenous, PRN, Kristeen Miss, MD   sodium phosphate (FLEET) 7-19 GM/118ML enema 1 enema, 1 enema, Rectal, Once PRN, Kristeen Miss, MD  Patients  Current Diet:     Diet Order                  Diet regular Room service appropriate? Yes; Fluid consistency: Thin  Diet effective now               Precautions / Restrictions Precautions Precautions: Fall, Back Precaution Comments: cues for back precautions with bed mobility Spinal Brace: Thoracolumbosacral orthotic, Applied in sitting position Restrictions Weight Bearing Restrictions: No   Has the patient had 2 or more falls or a fall with injury in the past year? Yes  Prior Activity Level Community (5-7x/wk): very active doing odd jobs; drove PTA: independent PTA  Prior Functional Level Self Care: Did the patient need help bathing, dressing, using the toilet or eating? Independent  Indoor Mobility: Did the patient need assistance with walking from room to room (with or without device)? Independent  Stairs: Did the patient need assistance with internal or external stairs (with or without device)? Independent  Functional Cognition: Did the patient need help planning regular tasks such as shopping or remembering to take medications? Independent  Home Assistive Devices / Equipment Home Assistive Devices/Equipment: None Home Equipment: None  Prior Device Use: Indicate devices/aids used by the patient prior to current illness, exacerbation or injury? None of the above  Current Functional Level Cognition  Overall Cognitive Status: Within Functional Limits for tasks assessed Current Attention Level: Selective Orientation Level: Oriented X4 General Comments: Not specifically tested, conversation normal, good carry over of learned activities.  He did not remember meeting me in the ICU (but this would still be normal)    Extremity Assessment (includes Sensation/Coordination)  Upper Extremity Assessment: Generalized weakness  Lower Extremity Assessment: Generalized weakness, Defer to PT evaluation RLE Deficits / Details: left continues to be functionally  weaker than right with gross in bed and EOB strength testing 3/5, pt with hyperextension moment on the left with WB and stepping.  RLE Sensation: decreased light touch LLE Deficits / Details: left continues to be functionally weaker than right with gross in bed and EOB strength testing 3/5, pt with hyperextension moment on the left with WB and stepping.  LLE Sensation: decreased light touch LLE Coordination: decreased gross motor    ADLs  Overall ADL's : Needs assistance/impaired Eating/Feeding: Set up, Sitting Grooming: Wash/dry hands, Wash/dry face, Oral care, Min guard, Standing Upper Body Bathing: Minimal assistance, Sitting Lower Body Bathing: Maximal assistance, Sit to/from stand Upper Body Dressing : Sitting, Moderate assistance Lower Body Dressing: Minimal assistance, Sitting/lateral leans, Sit to/from stand Lower Body Dressing Details (indicate cue type and reason): figure 4 technique  Toilet Transfer: Min guard, Ambulation, RW Toilet Transfer Details (indicate cue type and reason): simulated Toileting- Clothing Manipulation and Hygiene: Total assistance, Sit to/from stand Functional mobility during ADLs: Minimal assistance, Rolling walker, Cueing for safety General ADL Comments: set-upA for UB ADL and minA for LB ADL.    Mobility  Overal bed mobility: Needs Assistance Bed Mobility: Rolling, Sidelying to Sit Rolling: Supervision Sidelying to sit: Min guard Sit to sidelying: Min assist General bed mobility comments: Pt was OOB in the recliner chair.     Transfers  Overall transfer level: Needs assistance Equipment used: Rolling walker (2 wheeled) Transfers: Sit to/from Stand Sit to Stand: Min guard Stand pivot transfers: +2 physical assistance, Mod assist General transfer comment: minguardA for initial standing balance    Ambulation / Gait / Stairs / Wheelchair Mobility  Ambulation/Gait Ambulation/Gait assistance: Mod assist Gait Distance (Feet): 100  Feet Assistive device: Rolling walker (2 wheeled) Gait Pattern/deviations: Decreased stance time - left, Decreased stride length, Scissoring, Steppage, Decreased dorsiflexion - left, Step-through pattern General Gait Details: Pt did better keeping his feet apart today, slowing his speed and keeping turns wide at his BOS.  As he fatigued his steppage pattern increased and so did the hyperextension moments at both knees during stance.  Gait velocity: decreased Gait velocity interpretation: 1.31 - 2.62 ft/sec, indicative of limited community ambulator    Posture / Balance Dynamic Sitting Balance Sitting balance - Comments: supervision EOB, difficulty due to pain with weight shifting.  Balance Overall balance assessment: Needs assistance Sitting-balance support: Feet supported, Bilateral upper extremity supported Sitting balance-Leahy Scale: Fair Sitting balance - Comments: supervision EOB, difficulty due to pain with weight shifting.  Standing balance support: Bilateral upper extremity supported Standing balance-Leahy Scale: Poor Standing balance comment: needs external support in standing.  High Level Balance Comments: able to maneuver and open door with backward stepping while opening door with minA for support.    Special needs/care consideration BiPAP/CPAP : no CPM : no Continuous Drip IV : no Dialysis : no     Days : no Life Vest : no Oxygen : no Special Bed : no Trach Size : no Wound Vac (area) : no      Location : no Skin: surgical incision to back, puncture to right upper vertebral column                          Bowel mgmt: continent, last BM: 04/26/19 Bladder mgmt: continent Diabetic mgmt: : no Behavioral consideration : no Chemo/radiation : pending radiation (per Dr. Zada Finders (attending), radiation would not occur until at least 2 week post up follow up to ensure incision site  healed.    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  Sisters are taking care of her now  Discharge Living Setting Plans for Discharge Living Setting: Patient's home, Lives with (comment)(his mother (her house). 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; pt's sister can do these things) Contact Information: sister: [name] [phone]) mother ([place] : [phone]) Anticipated Caregiver: mother and sister Anticipated Caregiver's Contact Information: see above Ability/Limitations of Caregiver: Supervision 24/7; Min A only 1x/day visit by sister who works during the day Caregiver Availability: 24/7 Discharge Plan Discussed with Primary Caregiver: Consulting civil engineer and mother) Is Caregiver In Agreement with Plan?: Yes Does Caregiver/Family have Issues with Lodging/Transportation while Pt is in Rehab?: No  Goals/Additional Needs Patient/Family Goal for Rehab: PT/OT: Supervision; SLP: NA Expected length of stay: 6-10 days Cultural Considerations: NA Dietary Needs: regular diet, thin liquids Equipment Needs: TBD Additional Information: possible radiation therapy in near future Pt/Family Agrees to Admission and willing to participate: Yes Program Orientation Provided & Reviewed with Pt/Caregiver Including Roles  & Responsibilities: Yes(pt  and sister and mother)  Barriers to Discharge: Lack of/limited family support, Insurance for SNF coverage, Pending chemo/radiation(may have raditional soon post DC; mother only do Supervision)  Decrease burden of Care through IP rehab admission:   Possible need for SNF placement upon discharge: Not anticipated; Anticipate pt has ability to reach supervision level which is a level he will be able to return home with his mother for support. Pt understands the goal is to return home.   Patient Condition: I have reviewed medical records from [facility], spoken with CM, and patient and family member. I met with patient at the bedside for inpatient rehabilitation assessment.  Patient will benefit from ongoing PT and OT, can actively participate in 3 hours of therapy a day 5 days of the week, and can make measurable gains during the admission.  Patient will also benefit from the coordinated team approach during an Inpatient Acute Rehabilitation admission.  The patient will receive intensive therapy as well as Rehabilitation physician, nursing, social worker, and care management interventions.  Due to bladder management, bowel management, safety, skin/wound care, disease management, medication administration, pain management and patient education the patient requires 24 hour a day rehabilitation nursing.  The patient is currently Mod A with mobility and basic ADLs.  Discharge setting and therapy post discharge at home with home health is anticipated.  Patient has agreed to participate in the Acute Inpatient Rehabilitation Program and will admit today.  Preadmission Screen Completed By:  Paul Thornton, 04/26/2019 4:09 PM ______________________________________________________________________   Discussed status with Dr. Posey Pronto on 04/26/19 at 4:09PM and received approval for admission today.  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 Does the need for close, 24 hr/day Medical supervision in concert with the patient's rehab needs make it  unreasonable for this patient to be served in a less intensive setting? Yes  2. Co-Morbidities requiring supervision/potential complications: tobacco abuse, COPD, and GERD. Pt presented to the hospital with increased SOB, wheezing and worsening coughing spells 3. Due to bladder management, bowel management, safety, skin/wound care, disease management, pain management and patient education, does the patient require 24 hr/day rehab nursing? Yes 4. Does the patient require coordinated care of a physician, rehab nurse, PT (1-2 hrs/day, 5 days/week) and OT (1-2 hrs/day, 5 days/week) to address physical and functional deficits in the context of the above medical diagnosis(es)? Yes Addressing deficits in the following areas: balance, endurance, locomotion, strength, transferring, bathing, dressing, toileting and psychosocial support 5. Can the patient actively participate in an intensive therapy program of at least 3 hrs of therapy 5 days a week? Yes 6. The potential for patient to make measurable gains while on inpatient rehab is excellent 7. Anticipated functional outcomes upon discharge from inpatients are: supervision PT, supervision and min assist OT, n/a SLP 8. Estimated rehab length of stay to reach the above functional goals is: 10-14 days. 9. Anticipated D/C setting: Home 10. Anticipated post D/C treatments: HH therapy and Home excercise program 11. Overall Rehab/Functional Prognosis: good  MD Signature: Delice Lesch, MD, ABPMR        Revision History

## 2019-04-26 NOTE — Discharge Summary (Signed)
Discharge Summary  Date of Admission: 04/19/2019  Date of Discharge: 04/26/19  Attending Physician: Emelda Brothers, MD  Hospital Course: Patient was admitted for progressive back pain and lower extremity weakness with shortness of breath. He was diagnosed with a COPD exacerbation, but workup also discovered bronchiectasis due to a lung mass. MRI brain also showed small punctate areas of ischemic stroke, for which neurology recommended [id number], which was started while inpatient.He had a CT guided biopsy of the lung mass. Due to his leg weakness, an MRI was obtained, which showed epidural spinal cord compression. He was taken to the OR with Dr. Ellene Route for T3-5 decpmression and instrumented fusion from T2-T6.  He remained intubated post-op and was transferred to the ICU for recovery. TCDs showed some HITS, clinically insignificant PFO, echo showed no significantly concerning acute findings, BLE DVT US was negative, carotid US was unremarkable. He was extubated and did not require reintubation. His hospital course was otherwise uncomplicated and the patient was discharged to CIR on 04/26/2019. He will follow up in clinic with Dr. Ellene Route in 2 weeks as well as med onc, rad onc, and vascular neurology. 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  Neurologic exam at discharge:  AOx3, PERRL, EOMI, FS, TM Strength 5/5 in BUE, 4/5 in BLE with thoracic sensory level  Discharge Diagnosis: Spinal metastases with spinal cord compression  Diet: Resume regular diet  Discharge Medications: Allergies as of 04/26/2019      Reactions   Amoxicillin Hives   All "cillins"   Penicillins Hives   Did it involve swelling of the face/tongue/throat, SOB, or low BP? No Did it involve sudden or severe rash/hives, skin peeling, or any reaction on the inside of your mouth or nose? No Did you need to seek medical attention at a hospital or doctor's office? No When did it last happen? If all  above answers are "NO", may proceed with cephalosporin use.      Medication List    STOP taking these medications   predniSONE 50 MG tablet Commonly known as: DELTASONE     TAKE these medications   albuterol 108 (90 Base) MCG/ACT inhaler Commonly known as: VENTOLIN HFA Inhale 1-2 puffs into the lungs every 6 (six) hours as needed for wheezing or shortness of breath.   aspirin 325 MG EC tablet Take 1 tablet (325 mg total) by mouth daily. Start taking on: April 27, 2019   feeding supplement (ENSURE ENLIVE) Liqd Take 237 mLs by mouth 3 (three) times daily between meals.       Judith Part, MD 04/26/19 3:22 PM

## 2019-04-27 ENCOUNTER — Encounter (HOSPITAL_COMMUNITY): Payer: Self-pay

## 2019-04-27 ENCOUNTER — Other Ambulatory Visit: Payer: Self-pay | Admitting: *Deleted

## 2019-04-27 ENCOUNTER — Other Ambulatory Visit: Payer: Self-pay

## 2019-04-27 ENCOUNTER — Inpatient Hospital Stay (HOSPITAL_COMMUNITY): Payer: Self-pay | Admitting: Rehabilitation

## 2019-04-27 ENCOUNTER — Inpatient Hospital Stay (HOSPITAL_COMMUNITY): Payer: Self-pay | Admitting: Physical Therapy

## 2019-04-27 ENCOUNTER — Inpatient Hospital Stay (HOSPITAL_COMMUNITY): Payer: Self-pay | Admitting: Occupational Therapy

## 2019-04-27 DIAGNOSIS — R52 Pain, unspecified: Secondary | ICD-10-CM

## 2019-04-27 DIAGNOSIS — D72829 Elevated white blood cell count, unspecified: Secondary | ICD-10-CM

## 2019-04-27 DIAGNOSIS — I959 Hypotension, unspecified: Secondary | ICD-10-CM

## 2019-04-27 DIAGNOSIS — E46 Unspecified protein-calorie malnutrition: Secondary | ICD-10-CM

## 2019-04-27 DIAGNOSIS — D62 Acute posthemorrhagic anemia: Secondary | ICD-10-CM

## 2019-04-27 DIAGNOSIS — E8809 Other disorders of plasma-protein metabolism, not elsewhere classified: Secondary | ICD-10-CM

## 2019-04-27 DIAGNOSIS — C7951 Secondary malignant neoplasm of bone: Secondary | ICD-10-CM

## 2019-04-27 DIAGNOSIS — K5903 Drug induced constipation: Secondary | ICD-10-CM

## 2019-04-27 LAB — COMPREHENSIVE METABOLIC PANEL
ALT: 36 U/L (ref 0–44)
AST: 18 U/L (ref 15–41)
Albumin: 2.3 g/dL — ABNORMAL LOW (ref 3.5–5.0)
Alkaline Phosphatase: 88 U/L (ref 38–126)
Anion gap: 8 (ref 5–15)
BUN: 14 mg/dL (ref 6–20)
CO2: 31 mmol/L (ref 22–32)
Calcium: 8.5 mg/dL — ABNORMAL LOW (ref 8.9–10.3)
Chloride: 96 mmol/L — ABNORMAL LOW (ref 98–111)
Creatinine, Ser: 0.7 mg/dL (ref 0.61–1.24)
GFR calc Af Amer: >60 mL/min (ref >60)
GFR calc non Af Amer: >60 mL/min (ref >60)
Glucose, Bld: 99 mg/dL (ref 70–99)
Potassium: 3.5 mmol/L (ref 3.5–5.1)
Sodium: 135 mmol/L (ref 135–145)
Total Bilirubin: 0.6 mg/dL (ref 0.3–1.2)
Total Protein: 5.6 g/dL — ABNORMAL LOW (ref 6.5–8.1)

## 2019-04-27 LAB — CBC WITH DIFFERENTIAL/PLATELET
Abs Immature Granulocytes: 0.21 10*3/uL — ABNORMAL HIGH (ref 0.00–0.07)
Basophils Absolute: 0 10*3/uL (ref 0.0–0.1)
Basophils Relative: 0 %
Eosinophils Absolute: 0.1 10*3/uL (ref 0.0–0.5)
Eosinophils Relative: 1 %
HCT: 33.7 % — ABNORMAL LOW (ref 39.0–52.0)
Hemoglobin: 10.9 g/dL — ABNORMAL LOW (ref 13.0–17.0)
Immature Granulocytes: 2 %
Lymphocytes Relative: 13 %
Lymphs Abs: 1.5 10*3/uL (ref 0.7–4.0)
MCH: 29.2 pg (ref 26.0–34.0)
MCHC: 32.3 g/dL (ref 30.0–36.0)
MCV: 90.3 fL (ref 80.0–100.0)
Monocytes Absolute: 1.1 10*3/uL — ABNORMAL HIGH (ref 0.1–1.0)
Monocytes Relative: 10 %
Neutro Abs: 8.5 10*3/uL — ABNORMAL HIGH (ref 1.7–7.7)
Neutrophils Relative %: 74 %
Platelets: 475 10*3/uL — ABNORMAL HIGH (ref 150–400)
RBC: 3.73 MIL/uL — ABNORMAL LOW (ref 4.22–5.81)
RDW: 15.2 % (ref 11.5–15.5)
WBC: 11.4 10*3/uL — ABNORMAL HIGH (ref 4.0–10.5)
nRBC: 0 % (ref 0.0–0.2)

## 2019-04-27 MED ORDER — ENSURE ENLIVE PO LIQD
237.0000 mL | Freq: Three times a day (TID) | ORAL | Status: DC
  Administered 2019-04-27 – 2019-05-02 (×9): 237 mL via ORAL

## 2019-04-27 MED ORDER — BISACODYL 10 MG RE SUPP
10.0000 mg | Freq: Once | RECTAL | Status: AC
  Administered 2019-04-27: 10 mg via RECTAL
  Filled 2019-04-27: qty 1

## 2019-04-27 MED ORDER — MAGNESIUM HYDROXIDE 400 MG/5ML PO SUSP
30.0000 mL | Freq: Once | ORAL | Status: AC
  Administered 2019-04-27: 30 mL via ORAL
  Filled 2019-04-27: qty 30

## 2019-04-27 MED ORDER — PRO-STAT SUGAR FREE PO LIQD
30.0000 mL | Freq: Two times a day (BID) | ORAL | Status: DC
  Administered 2019-04-27 – 2019-05-06 (×19): 30 mL via ORAL
  Filled 2019-04-27 (×18): qty 30

## 2019-04-27 NOTE — Evaluation (Signed)
Occupational Therapy Assessment and Plan  Patient Details  Name: NUMAN ZYLSTRA MRN: [id number] Date of Birth: [birth year]  OT Diagnosis: muscle weakness (generalized) Rehab Potential: Rehab Potential (ACUTE ONLY): Good ELOS: 12-14 days   Today's Date: 04/27/2019 OT Individual Time: 1400-1500 OT Individual Time Calculation (min): 60 min     Problem List:  Patient Active Problem List   Diagnosis Date Noted  . Pain   . Acute blood loss anemia   . Hypoalbuminemia due to protein-calorie malnutrition ([place])   . Drug induced constipation   . Protein-calorie malnutrition, severe 04/26/2019  . Metastatic cancer to spine ([place]) 04/26/2019  . Acute exacerbation of chronic obstructive pulmonary disease (COPD) ([place])   . Diarrhea due to malabsorption   . Chronic obstructive pulmonary disease ([place])   . Hypotension   . Leukocytosis   . Primary malignant neoplasm of right lung metastatic to other site [facility])   . Cerebral embolism with cerebral infarction 04/21/2019  . 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 COPD exacerbation ([place]) 04/19/2019  . Tobacco abuse 04/19/2019  . Mass of right lung 04/19/2019  . GERD (gastroesophageal reflux disease) 04/19/2019  . Bronchitis 04/19/2019    Past Medical History:  Past Medical History:  Diagnosis Date  . Bronchitis   . Cancer ([place])   . COPD (chronic obstructive pulmonary disease) ([place])   . GERD (gastroesophageal reflux disease)   . Stroke [facility])    Past Surgical History:  Past Surgical History:  Procedure Laterality Date  . POSTERIOR LUMBAR FUSION 4 LEVEL N/A 04/20/2019   Procedure: POSTERIOR THORACIC FUSION 5 LEVEL;  Surgeon: Kristeen Miss, MD;  Location: [place];  Service: Neurosurgery;  Laterality: N/A;  POSTERIOR THORACIC FUSION 5 LEVEL    Assessment & Plan Clinical Impression: Patient is a 58 y.o. year old male with recent admission to the hospital on with history of COPD, tobacco  abuse who was admitted via ED on 04/19/2019 with 5-day history of wheezing, cough, chest wall pain, bilateral lower extremity weakness.  History taken from chart review and patient.  He revealed a 63-monthhistory of weight loss.  Work up revealed elevated d-dimer, tachypneia with mild hypoxic and audible wheezing. CT chest done for work up and revealed large 7.2 X 6.2 X 6.9 cm RUL neoplasm invading adjacent paratracheal region on right as well as right posterior pleura, right 4 th and 5th ribs as T4 and T5 vertebral bodies with marked collapse of T4 vertebral body with invasion of T5 elements on the right. Incidental findings of extensive emphysematous changes and hepatic steatosis noted. He was started on doxycycline due to concerns of COPD exacerbation/bronchitis. IR consulted biopsy of right lung mass and thoracic tumor which was positive for adenocarcinoma.   MRI brain done, reviewed, showing right frontal infarct.  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  Neurology consulted for input and recommended full work-up to rule out embolic source.  BLE Dopplers done for work-up and negative for DVT.  TCD negative for HITS.   2D echo with ejection fraction of 55-60%.  Aspirin recommended for secondary stroke prevention and stroke felt to be due to atherosclerosis versus cancer related hypercoagulability.  Dr. EEllene Routeconsulted for input and patient underwent laminectomy with decompression of thoracic spine from T3-T5 and vertebrectomies T3-T4 with iliac crest allograft and fixation on 6/26.  Postprocedure  tolerated extubation without difficulty and has had improvement in BLE strength. Has been treated with IV steroids from 6/26- 6/30.  Nursing swallow evaluation showed no signs of dysphagia dysphagia. Steroids discontinued today and  His case was presented at CNS  tumor board on 6/29 and Dr. Mohamed/lung cancer team to follow with input.  Therapy ongoing and patient continues to be limited by bilateral lower extremity weakness with unsteady gait. Patient transferred to CIR on 04/26/2019 .    Patient currently requires mod with basic self-care skills secondary to muscle weakness and muscle paralysis and decreased sitting balance, decreased standing balance, decreased postural control and decreased balance strategies.  Prior to hospitalization, patient could complete self care with independent .  Patient will benefit from skilled intervention to decrease level of assist with basic self-care skills and increase independence with basic self-care skills prior to discharge home with care partner.  Anticipate patient will require intermittent supervision and follow up home health.  OT - End of Session Activity Tolerance: Tolerates 10 - 20 min activity with multiple rests Endurance Deficit: Yes Endurance Deficit Description: fatigue noted with basic ADL tasks OT Assessment Rehab Potential (ACUTE ONLY): Good OT Patient demonstrates impairments in the following area(s): Balance;Endurance;Motor OT Basic ADL's Functional Problem(s): Grooming;Bathing;Dressing;Toileting OT Advanced ADL's Functional Problem(s): Simple Meal Preparation;Light Housekeeping OT Transfers Functional Problem(s): Toilet;Tub/Shower OT Plan OT Intensity: Minimum of 1-2 x/day, 45 to 90 minutes OT Frequency: 5 out of 7 days OT Duration/Estimated Length of Stay: 12-14 days OT Treatment/Interventions: Balance/vestibular training;Self Care/advanced ADL retraining;Therapeutic Exercise;Wheelchair propulsion/positioning;DME/adaptive equipment instruction;UE/LE Strength taining/ROM;Patient/family education;Discharge planning;Functional mobility training;Therapeutic Activities OT Self Feeding Anticipated Outcome(s): independent OT Basic Self-Care Anticipated Outcome(s): mod I OT Toileting Anticipated  Outcome(s): mod I OT Bathroom Transfers Anticipated Outcome(s): mod I/ supervision OT Recommendation Patient destination: Home Follow Up Recommendations: Home health OT Equipment Recommended: Tub/shower bench;To be determined   Skilled Therapeutic Intervention Patient in bed, ready for therapy session.  He is pleasant and cooperative, aware of current needs.  Evaluation completed as documented below.  Reviewed role of OT, therapy schedule, plan of care, DME and therapy goals.  He presents with significant deconditioning and LE weakness limiting mobility and independence with self care and HM.  Functional transfers with min/mod A using RW.  Adl performed at w/c level at sink - details below.  He returned to bed at close of session noting significant fatigue.  Bed alarm set and call bell in reach.    OT Evaluation Precautions/Restrictions  Precautions Precautions: Back;Fall Precaution Comments: cues for back precautions during self care tasks Required Braces or Orthoses: Spinal Brace Spinal Brace: Thoracolumbosacral orthotic;Applied in sitting position Restrictions Weight Bearing Restrictions: No General Chart Reviewed: Yes Vital Signs Therapy Vitals Temp: 99.1 F (37.3 C) Temp Source: Oral Pulse Rate: 89 Resp: 18 BP: 108/63 Patient Position (if appropriate): Lying Oxygen Therapy SpO2: 98 % O2 Device: Room Air Pain Pain Assessment Pain Scale: 0-10 Pain Score: 0-No pain Home Living/Prior Functioning Home Living Family/patient expects to be discharged to:: Private residence Living Arrangements: Parent, Other relatives Available Help at Discharge: Family, Available 24 hours/day Type of Home: House Home Access: Level entry Home Layout: One level Bathroom Shower/Tub: Chiropodist: Handicapped height Additional Comments: 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  Pt independent in ADLs, drives, community ambulator(takes mother to her appointments, shops for her) ADL ADL Eating: Independent Where Assessed-Eating: Bed level Grooming: Setup Where Assessed-Grooming: Sitting at sink Upper Body Bathing: Setup Where Assessed-Upper Body Bathing: Sitting at sink Lower Body Bathing: Moderate assistance Where Assessed-Lower Body Bathing: Sitting at sink Upper Body Dressing: Setup Where Assessed-Upper Body Dressing: Sitting at sink Lower Body Dressing: Moderate assistance Where Assessed-Lower Body Dressing: Wheelchair Toileting: Moderate assistance Where Assessed-Toileting: Glass blower/designer: Moderate assistance Toilet Transfer Method: Stand pivot Toilet Transfer Equipment: Grab bars ADL Comments: not cleared to shower at this time Vision Baseline Vision/History: Wears glasses Wears Glasses: At all times(glasses at home - will ask sister to send them in) Patient Visual Report: No change from baseline Vision Assessment?: No apparent visual deficits Additional Comments: no deficits noted with gross assessment of ROM and fields Perception  Perception: Within Functional Limits Praxis Praxis: Intact Cognition Overall Cognitive Status: Within Functional Limits for tasks assessed Arousal/Alertness: Awake/alert Orientation Level: Person;Place;Situation Person: Oriented Place: Oriented Situation: Oriented Year: 2020 Month: July Day of Week: Incorrect Memory: Appears intact Immediate Memory Recall: Sock;Blue;Bed Memory Recall Sock: Without Cue Memory Recall Blue: Without Cue Memory Recall Bed: Without Cue Attention: Sustained Sustained Attention: Appears  intact Selective Attention: Appears intact Awareness: Appears intact Problem Solving: Appears intact Safety/Judgment: Appears intact Comments: Pt aware that he moves quickly Sensation Sensation Additional Comments: UB light touch and hot/cold intact Coordination Finger Nose Finger Test: [facility] Motor  Motor Motor - Skilled Clinical Observations: BLE weakness, L>R Mobility  Bed Mobility Rolling Left: Independent Left Sidelying to Sit: Minimal Assistance - Patient >75% Transfers Sit to Stand: Moderate Assistance - Patient 50-74% Stand to Sit: Minimal Assistance - Patient > 75%  Trunk/Postural Assessment     Balance Static Sitting Balance Static Sitting - Level of Assistance: 6: Modified independent (Device/Increase time) Dynamic Sitting Balance Dynamic Sitting - Level of Assistance: 5: Stand by assistance Static Standing Balance Static Standing - Level of Assistance: 4: Min assist Dynamic Standing Balance Dynamic Standing - Level of Assistance: 3: Mod assist Extremity/Trunk Assessment RUE Assessment RUE Assessment: Within Functional Limits General Strength Comments: 4+/5 LUE Assessment LUE Assessment: Within Functional Limits General Strength Comments: 4+/5     Refer to Care Plan for Long Term Goals  Recommendations for other services: None    Discharge Criteria: Patient will be discharged from OT if patient refuses treatment 3 consecutive times without medical reason, if treatment goals not met, if there is a change in medical status, if patient makes no progress towards goals or if patient is discharged from hospital.  The above assessment, treatment plan, treatment alternatives and goals were discussed and mutually agreed upon: by patient  Carlos Levering 04/27/2019, 4:16 PM

## 2019-04-27 NOTE — Progress Notes (Addendum)
[place] PHYSICAL MEDICINE & REHABILITATION PROGRESS NOTE  Subjective/Complaints: Patient seen sitting up in bed this morning.  He states he slept well overnight.  He states he is ready begin therapies today.  ROS: Denies CP, shortness of breath, nausea, vomiting, diarrhea.  Objective: Vital Signs: Blood pressure 98/65, pulse 70, temperature 97.6 F (36.4 C), temperature source Oral, resp. rate 18, height 5\' 9"  (1.753 m), weight 41.1 kg, SpO2 100 %. Dg Abd 1 View  Result Date: 04/26/2019 CLINICAL DATA:  Epigastric pain x3 days EXAM: ABDOMEN - 1 VIEW COMPARISON:  April 21, 2019 FINDINGS: There is a large amount of stool throughout the colon. The liver is somewhat enlarged. The bowel gas pattern is nonobstructive and nonspecific. There is no acute osseous abnormality. There are degenerative changes throughout the lumbar spine and hips. The enteric tube has been removed. IMPRESSION: 1. Nonobstructive bowel gas pattern. 2. Above average amount of stool throughout the colon. Electronically Signed   By: Constance Holster M.D.   On: 04/26/2019 20:43   Recent Labs    04/27/19 0625  WBC 11.4*  HGB 10.9*  HCT 33.7*  PLT 475*   Recent Labs    04/27/19 0625  NA 135  K 3.5  CL 96*  CO2 31  GLUCOSE 99  BUN 14  CREATININE 0.70  CALCIUM 8.5*    Physical Exam: BP 98/65 (BP Location: Left Arm)   Pulse 70   Temp 97.6 F (36.4 C) (Oral)   Resp 18   Ht 5\' 9"  (1.753 m)   Wt 41.1 kg   SpO2 100%   BMI 13.38 kg/m  Constitutional: No distress . Vital signs reviewed.  Cachectic. HENT: Normocephalic.  Atraumatic. Eyes: EOMI. No discharge. Cardiovascular: No JVD. Respiratory: Normal effort. GI: Non-distended. Musc: No edema or tenderness in extremities. Neurological: Alert and oriented. Motor: Bilateral upper extremities: 5/5 proximal distal Right lower extremity: 4+/5 proximal and distal Left lower extremity: 4-/5 proximal to distal  Skin: Skin is warm.  Thoracic incision  C/D/I Psychiatric: He has a normal mood and affect. His behavior is normal.   Assessment/Plan: 1. 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  Physiatrist is providing close team supervision and 24 hour management of active medical problems listed below.  Physiatrist and rehab team continue to assess barriers to discharge/monitor patient progress toward functional and medical goals  Care Tool:  Bathing              Bathing assist       Upper Body Dressing/Undressing Upper body dressing        Upper body assist      Lower Body Dressing/Undressing Lower body dressing            Lower body assist       Toileting Toileting    Toileting assist       Transfers Chair/bed transfer  Transfers assist           Locomotion Ambulation   Ambulation assist              Walk 10 feet activity   Assist           Walk 50 feet activity   Assist           Walk 150 feet activity   Assist           Walk 10  feet on uneven surface  activity   Assist           Wheelchair     Assist               Wheelchair 50 feet with 2 turns activity    Assist            Wheelchair 150 feet activity     Assist            Medical Problem List and Plan: 1.  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  Begin CIR evaluations 2.  Antithrombotics: -DVT/anticoagulation:  Pharmaceutical: Heparin             -antiplatelet therapy: ASA 3. Pain Management:  Hydrocodone prn 4. Mood: Team to provide ego support.    Added Xanax prn anxiety.              -antipsychotic agents: N/A 5. Neuropsych: This patient is capable of making decisions on his own behalf. 6.  Skin/Wound Care: Routine pressure relief measures. Monitor wound daily.  7. Fluids/Electrolytes/Nutrition: Monitor I/O.   8. Lung cancer with bony mets: Follow-up heme/Onc recs. 9. Abdominal pain:   KUB personally reviewed, showing constipation  See #13 10.  Leucocytosis: Monitor for signs of infection.    WBCs 11.4 on 7/2  Continue to monitor 11. Hypotension:  Monitor BP tid. Monitor for orthostatic symptoms  Monitor with increased mobility 12. COPD with SOB:  Continue Pulmicort nebs.  13.  Bowels:   Diarrhea likely related to constipation  MiraLAX twice daily ordered on 7/2  1 time bowel regimen given on 7/2 14.  Hypokalemia  Potassium 3.5 on 7/2 and trending down  Continue to monitor 15.  Hypoalbuminemia  Supplement initiated on 7/2 16.  Acute blood loss anemia  Hemoglobin 10.9 on 7/2  Continue to monitor  LOS: 1 days A FACE TO FACE EVALUATION WAS PERFORMED  Juston Goheen Lorie Phenix 04/27/2019, 8:31 AM

## 2019-04-27 NOTE — Plan of Care (Signed)
PT evaluation complete and goals set.

## 2019-04-27 NOTE — Progress Notes (Signed)
Initial Nutrition Assessment  DOCUMENTATION CODES:   Severe malnutrition in context of chronic illness, Underweight  INTERVENTION:   - Recommend ordering daily weights  - Ensure Enlive po TID, each supplement provides 350 kcal and 20 grams of protein  - Snacks TID between meals, RD ordered via [employer] diet software  - Continue Pro-stat 30 ml BID, each supplement provides 100 kcal and 15 grams of protein  NUTRITION DIAGNOSIS:   Severe Malnutrition related to chronic illness (lung cancer with mets to spine s/p decompression) as evidenced by severe fat depletion, severe muscle depletion, percent weight loss (12.2% weight loss in less than 1 month).  GOAL:   Patient will meet greater than or equal to 90% of their needs  MONITOR:   PO intake, Supplement acceptance, Labs, Weight trends, I & O's, Skin  REASON FOR ASSESSMENT:   Malnutrition Screening Tool    ASSESSMENT:   58 year old male with PMH of COPD, tobacco abuse who was admitted on 04/19/19 with wheezing, cough, chest wall pain, and bilateral lower extremity weakness. CT chest done for work up and revealed large RUL neoplasm invading adjacent paratracheal region on right as well as right posterior pleura, right 4th and 5th ribs as T4 and T5 vertebral bodies with marked collapse of T4 vertebral body with invasion of T5 elements on the right. IR completed biopsy of right lung mass and thoracic tumor which was positive for adenocarcinoma. MRI brain done, reviewed, showing right frontal infarct. Pt underwent laminectomy with decompression of thoracic spine from T3-T5 and vertebrectomies T3-T4 with iliac crest allograft and fixation on 6/26. Pt admitted to CIR on 7/01.  Spoke with pt at bedside. Noted ~90% completed lunch meal tray.  Pt states that he has a great appetite and was ready for lunch when it came. Pt states that his appetite and eating habits had not really changed but that he had been losing weight and didn't know  why. Pt reports that PTA, he was eating "all through the day but smaller portions" and still losing weight.  Pt reports his UBW as 130-140 lbs and that he last weighed this about 3 months ago. Pt states that he has always been on the thinner side. Weight history in chart is limited. Last available weights prior to June 2020 are from 2014 and 2015. Noted pt has experienced a 5.7 kg weight loss in less than 1 month. This is a 12.2% weight loss which is significant for timeframe.  RD discussed the importance of adequate PO intake and increasing kcal and protein intake. Pt expresses understanding and states that he does want to gain weight. Pt willing to drink 2-3 Ensure Enlive supplements daily in addition to snacks TID between meals. RD ordered snacks based on pt's preferences via [place].  Meal Completion: 40% x 1 meal  Medications reviewed and include: Dulcolax, Pro-stat 30 ml BID, Pepcid, milk of magnesia once  Labs reviewed. CBG's: 82, 115 x 24 hours  UOP: 1250 ml x 24 hours  NUTRITION - FOCUSED PHYSICAL EXAM:    Most Recent Value  Orbital Region  Severe depletion  Upper Arm Region  Severe depletion  Thoracic and Lumbar Region  Severe depletion  Buccal Region  Severe depletion  Temple Region  Severe depletion  Clavicle Bone Region  Severe depletion  Clavicle and Acromion Bone Region  Severe depletion  Scapular Bone Region  Severe depletion  Dorsal Hand  Moderate depletion  Patellar Region  Severe depletion  Anterior Thigh Region  Severe depletion  Posterior Calf Region  Severe depletion  Edema (RD Assessment)  None  Hair  Reviewed  Eyes  Reviewed  Mouth  Reviewed  Skin  Reviewed  Nails  Reviewed       Diet Order:   Diet Order            Diet regular Room service appropriate? Yes; Fluid consistency: Thin  Diet effective now              EDUCATION NEEDS:   Education needs have been addressed  Skin:  Skin Assessment: Skin Integrity  Issues: Incisions: closed incision to back  Last BM:  04/26/19 small type 3  Height:   Ht Readings from Last 1 Encounters:  04/26/19 5\' 9"  (1.753 m)    Weight:   Wt Readings from Last 1 Encounters:  04/26/19 41.1 kg    Ideal Body Weight:  73 kg  BMI:  Body mass index is 13.38 kg/m.  Estimated Nutritional Needs:   Kcal:  1650-1850  Protein:  80-90 grams  Fluid:  >/= 1.6 L    Gaynell Face, MS, RD, LDN Inpatient Clinical Dietitian Pager: [phone] Weekend/After Hours: [phone]

## 2019-04-27 NOTE — Progress Notes (Signed)
Physical Therapy Session Note  Patient Details  Name: Paul Thornton MRN: [id number] Date of Birth: 1961-05-07  Today's Date: 04/27/2019 PT Individual Time: 0950-1045 PT Individual Time Calculation (min): 55 min   Short Term Goals: Week 1:  PT Short Term Goal 1 (Week 1): Pt will perform bed mobility at S level with cues for maintaining precautions <25% of time. PT Short Term Goal 2 (Week 1): Pt will perform sit<>stand at min A level. PT Short Term Goal 3 (Week 1): Pt will perform stand pivot transfers at min A level w/ RW. PT Short Term Goal 4 (Week 1): Pt will ambulate x 100' with RW at min A consistently.    Skilled Therapeutic Interventions/Progress Updates:   Pt received sitting in WC and agreeable to PT. WC mobility 3x 260f with supervision assist from PT, cues for safety in tight space of BI gym and pt room.   Gait   trainnig with RW 2 x 82fand min-mod assist from PT with fatigue. Pt noted to have significant SOB upon completion of first bout, SpO2 assessed at 97%. Continuous SpO2 assessment on second bout, HR increase to 120bpm and 94% SpO2. Increasing ataxia on the L>R LE as pt fatigues.   Dynamic balance training with RW to performed reciprocal forward foot taps 2 x15 BLE. And lateral foot taps 2 x 10 with BUE support min assist overall form PT. Dynavision with 1 UE support on RW, 2 x 65m20mwith each UE; program A. Min cues to prevent GR on the LLE and and to maintain back precautions and prevent trunk rotation.   Throughout treatment, pt performed sit<>stand x 15 with min assist and RW for safety. Min cues for proper UE and LE placement to improve safety Patient returned to room and [facility] and all needs met.    Therapy Documentation Precautions:  Precautions Precautions: Fall, Back Precaution Comments: cues for back precautions with bed mobility Required Braces or Orthoses: Spinal Brace Spinal Brace: Thoracolumbosacral orthotic, Applied in  sitting position Restrictions Weight Bearing Restrictions: No General: Chart Reviewed: Yes Vital Signs:   SpO2: 94 % with ambulation.  Pain: Pain Assessment Pain Scale: 0-10 Pain Score: 4  Pain Type: Surgical pain Pain Location: Back Pain Orientation: Mid Pain Descriptors / Indicators: Aching Pain Onset: On-going Pain Intervention(s): Medication (See eMAR)(had gotten medication prior to session) Mobility: Bed Mobility Bed Mobility: Rolling Right;Rolling Left;Left Sidelying to Sit Rolling Right: Independent Rolling Left: Independent Left Sidelying to Sit: Supervision/Verbal cueing Transfers Transfers: Sit to Stand;Stand to Sit;Stand Pivot Transfers Sit to Stand: Moderate Assistance - Patient 50-74% Stand to Sit: Minimal Assistance - Patient > 75% Stand Pivot Transfers: Moderate Assistance - Patient 50 - 74% Stand Pivot Transfer Details: Verbal cues for sequencing;Verbal cues for technique;Verbal cues for precautions/safety;Verbal cues for safe use of DME/AE;Manual facilitation for weight shifting Stand Pivot Transfer Details (indicate cue type and reason): Cues for safety especially when turning Transfer (Assistive device): Rolling walker Locomotion : Gait Ambulation: Yes Gait Assistance: Moderate Assistance - Patient 50-74% Gait Distance (Feet): 20 Feet(assessed without RW, did not have time to assess with RW) Assistive device: 1 person hand held assist Gait Assistance Details: Verbal cues for technique;Verbal cues for precautions/safety;Manual facilitation for weight shifting Gait Assistance Details: Pt with narrow BOS, slight scissoring with LLE, better with increased walking.  Cues for posture and to increase BOS. Gait Gait: Yes Gait Pattern: Impaired Gait Pattern: Ataxic;Trunk flexed;Step-through pattern;Decreased stride length;Decreased hip/knee flexion -  right;Decreased hip/knee flexion - left;Narrow base of support Gait velocity: decreased Stairs / Additional  Locomotion Stairs: Yes Stairs Assistance: Moderate Assistance - Patient 50 - 74% Stair Management Technique: Two rails Number of Stairs: 4 Height of Stairs: 6 Wheelchair Mobility Wheelchair Mobility: Yes Wheelchair Assistance: Chartered loss adjuster: Both upper extremities Wheelchair Parts Management: Needs assistance Distance: 100'  Trunk/Postural Assessment : Cervical Assessment Cervical Assessment: Exceptions to [facility] Cervical AROM Overall Cervical AROM Comments: Forward head Thoracic Assessment Thoracic Assessment: Exceptions to [facility] Thoracic AROM Overall Thoracic AROM Comments: Forward/rounded shoulders  Balance: Balance Balance Assessed: Yes Static Sitting Balance Static Sitting - Level of Assistance: 7: Independent Dynamic Sitting Balance Dynamic Sitting - Level of Assistance: 5: Stand by assistance Sitting balance - Comments: S for safety Static Standing Balance Static Standing - Level of Assistance: 4: Min assist;5: Stand by assistance Dynamic Standing Balance Dynamic Standing - Level of Assistance: 3: Mod assist    Therapy/Group: Individual Therapy  Lorie Phenix 04/27/2019, 10:53 AM

## 2019-04-27 NOTE — Evaluation (Signed)
Physical Therapy Assessment and Plan  Patient Details  Name: Paul Thornton MRN: [id number] Date of Birth: 10-23-1961  PT Diagnosis: Abnormal posture, Abnormality of gait, Ataxia, Coordination disorder, Hemiparesis non-dominant and Muscle weakness Rehab Potential: Good ELOS: 12-14 days   Today's Date: 04/27/2019 PT Individual Time: 6440-3474 PT Individual Time Calculation (min): 59 min    Problem List:  Patient Active Problem List   Diagnosis Date Noted  . Pain   . Acute blood loss anemia   . Hypoalbuminemia due to protein-calorie malnutrition ([place])   . Drug induced constipation   . Protein-calorie malnutrition, severe 04/26/2019  . Metastatic cancer to spine ([place]) 04/26/2019  . Acute exacerbation of chronic obstructive pulmonary disease (COPD) ([place])   . Diarrhea due to malabsorption   . Chronic obstructive pulmonary disease ([place])   . Hypotension   . Leukocytosis   . Primary malignant neoplasm of right lung metastatic to other site [facility])   . Cerebral embolism with cerebral infarction 04/21/2019  . 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 COPD exacerbation ([place]) 04/19/2019  . Tobacco abuse 04/19/2019  . Mass of right lung 04/19/2019  . GERD (gastroesophageal reflux disease) 04/19/2019  . Bronchitis 04/19/2019    Past Medical History:  Past Medical History:  Diagnosis Date  . Bronchitis   . Cancer ([place])   . COPD (chronic obstructive pulmonary disease) ([place])   . GERD (gastroesophageal reflux disease)   . Stroke [facility])    Past Surgical History:  Past Surgical History:  Procedure Laterality Date  . POSTERIOR LUMBAR FUSION 4 LEVEL N/A 04/20/2019   Procedure: POSTERIOR THORACIC FUSION 5 LEVEL;  Surgeon: Kristeen Miss, MD;  Location: [place];  Service: Neurosurgery;  Laterality: N/A;  POSTERIOR THORACIC FUSION 5 LEVEL    Assessment & Plan Clinical Impression: Patient is a 58 y.o. year old male with recent  admission to the hospital with history of COPD, tobacco abuse who was admitted via ED on 04/19/2019 with 5-day history of wheezing, cough, chest wall pain, bilateral lower extremity weakness.  History taken from chart review and patient.  He revealed a 32-monthhistory of weight loss.  Work up revealed elevated d-dimer, tachypneia with mild hypoxic and audible wheezing. CT chest done for work up and revealed large 7.2 X 6.2 X 6.9 cm RUL neoplasm invading adjacent paratracheal region on right as well as right posterior pleura, right 4 th and 5th ribs as T4 and T5 vertebral bodies with marked collapse of T4 vertebral body with invasion of T5 elements on the right. Incidental findings of extensive emphysematous changes and hepatic steatosis noted. He was started on doxycycline due to concerns of COPD exacerbation/bronchitis. IR consulted biopsy of right lung mass and thoracic tumor which was positive for adenocarcinoma.   MRI brain done, reviewed, showing right frontal infarct.  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  Neurology consulted for input and recommended full work-up to rule out embolic source.  BLE Dopplers done for work-up and negative for DVT.  TCD negative for HITS.   2D echo with ejection fraction of 55-60%.  Aspirin recommended for secondary stroke prevention and stroke felt to be due to atherosclerosis versus cancer related hypercoagulability.  Dr. EEllene Routeconsulted for input and patient underwent laminectomy with decompression of thoracic spine from T3-T5 and vertebrectomies T3-T4 with iliac crest allograft and fixation  on 6/26.  Postprocedure tolerated extubation without difficulty and has had improvement in BLE strength. Has been treated with IV steroids from 6/26- 6/30.  Nursing swallow evaluation showed no signs of dysphagia dysphagia. Steroids  discontinued today and  His case was presented at CNS tumor board on 6/29 and Dr. Mohamed/lung cancer team to follow with input.  Therapy ongoing and patient continues to be limited by bilateral lower extremity weakness with unsteady gait. CIR recommended due to functional deficits.  Please see preadmission assessment from earlier today as well. .  Patient transferred to CIR on 04/26/2019 .   Patient currently requires mod with mobility secondary to muscle weakness, decreased cardiorespiratoy endurance and ataxia and decreased coordination.  Prior to hospitalization, patient was independent  with mobility and lived with Family in a House home.  Home access is  Level entry.  Patient will benefit from skilled PT intervention to maximize safe functional mobility, minimize fall risk and decrease caregiver burden for planned discharge home with 24 hour supervision.  Anticipate patient will benefit from follow up [place] at discharge.  PT - End of Session Activity Tolerance: Tolerates < 10 min activity, no significant change in vital signs Endurance Deficit: Yes Endurance Deficit Description: Pt gets SOB with very short distance ambulation and stairs PT Assessment Rehab Potential (ACUTE/IP ONLY): Good PT Barriers to Discharge: Medical stability PT Barriers to Discharge Comments: decreased endurance, BLE weakness, high fall risk PT Patient demonstrates impairments in the following area(s): Balance;Endurance;Motor;Skin Integrity;Safety PT Transfers Functional Problem(s): Bed Mobility;Bed to Chair;Car;Furniture PT Locomotion Functional Problem(s): Ambulation;Wheelchair Mobility;Stairs PT Plan PT Intensity: Minimum of 1-2 x/day ,45 to 90 minutes PT Frequency: 5 out of 7 days PT Duration Estimated Length of Stay: 12-14 days PT Treatment/Interventions: Ambulation/gait training;Balance/vestibular training;Community reintegration;Discharge planning;Disease management/prevention;DME/adaptive equipment  instruction;Functional mobility training;Neuromuscular re-education;Patient/family education;Psychosocial support;Skin care/wound management;Stair training;Therapeutic Activities;Splinting/orthotics;Therapeutic Exercise;UE/LE Strength taining/ROM;UE/LE Coordination activities;Wheelchair propulsion/positioning PT Transfers Anticipated Outcome(s): Supervision with AD PT Locomotion Anticipated Outcome(s): S with AD for household distances, S level for w/c in community PT Recommendation Recommendations for Other Services: Therapeutic Recreation consult Follow Up Recommendations: Home health PT;Outpatient PT Patient destination: Home Equipment Recommended: Rolling walker with 5" wheels;Wheelchair cushion (measurements) Equipment Details: would need 16x16 w/c with ROHO cushion  Skilled Therapeutic Intervention   PT Evaluation Precautions/Restrictions Precautions Precautions: Fall;Back Precaution Comments: cues for back precautions with bed mobility Required Braces or Orthoses: Spinal Brace Spinal Brace: Thoracolumbosacral orthotic;Applied in sitting position Restrictions Weight Bearing Restrictions: No General Chart Reviewed: Yes Vital Signs  Pain Pain Assessment Pain Scale: 0-10 Pain Score: 4  Pain Type: Surgical pain Pain Location: Back Pain Orientation: Mid Pain Descriptors / Indicators: Aching Pain Onset: On-going Pain Intervention(s): Medication (See eMAR)(had gotten medication prior to session) Home Living/Prior Functioning Home Living Available Help at Discharge: Family;Available 24 hours/day Type of Home: House Home Access: Level entry Bathroom Shower/Tub: Tub/shower unit Bathroom Toilet: Standard(does have elevated height in another bathroom) Additional Comments: 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  Pt independent in ADLs, drives, community ambulator(takes mother to her appointments, shops for her) Vision/Perception     Cognition Overall Cognitive Status: Within Functional Limits for tasks assessed Arousal/Alertness: Awake/alert  Orientation Level: Oriented X4 Attention: Selective Selective Attention: Appears intact Memory: Appears intact Comments: Pt aware that he moves quickly Sensation Sensation Light Touch: Appears Intact Hot/Cold: Appears Intact Proprioception: Impaired by gross assessment Coordination Gross Motor Movements are Fluid and Coordinated: No Fine Motor Movements are Fluid and Coordinated: No(in LEs) Coordination and Movement Description: Pt with decreased coordination in LLE, esp evident when making turns and doing stairs. Heel Shin Test: Slight dysmetria on LLE Motor  Motor Motor: Hemiplegia Motor - Skilled Clinical Observations: BLE weakness, L>R  Mobility Bed Mobility Bed Mobility: Rolling Right;Rolling Left;Left Sidelying to Sit Rolling Right: Independent Rolling Left: Independent Left Sidelying to Sit: Supervision/Verbal cueing Transfers Transfers: Sit to Stand;Stand to Sit;Stand Pivot Transfers Sit to Stand: Moderate Assistance - Patient 50-74% Stand to Sit: Minimal Assistance - Patient > 75% Stand Pivot Transfers: Moderate Assistance - Patient 50 - 74% Stand Pivot Transfer Details: Verbal cues for sequencing;Verbal cues for technique;Verbal cues for precautions/safety;Verbal cues for safe use of DME/AE;Manual facilitation for weight shifting Stand Pivot Transfer Details (indicate cue type and reason): Cues for safety especially when turning Transfer (Assistive device): Rolling walker Locomotion  Gait Ambulation: Yes Gait Assistance:  Moderate Assistance - Patient 50-74% Gait Distance (Feet): 20 Feet(assessed without RW, did not have time to assess with RW) Assistive device: 1 person hand held assist Gait Assistance Details: Verbal cues for technique;Verbal cues for precautions/safety;Manual facilitation for weight shifting Gait Assistance Details: Pt with narrow BOS, slight scissoring with LLE, better with increased walking.  Cues for posture and to increase BOS. Gait Gait: Yes Gait Pattern: Impaired Gait Pattern: Ataxic;Trunk flexed;Step-through pattern;Decreased stride length;Decreased hip/knee flexion - right;Decreased hip/knee flexion - left;Narrow base of support Gait velocity: decreased Stairs / Additional Locomotion Stairs: Yes Stairs Assistance: Moderate Assistance - Patient 50 - 74% Stair Management Technique: Two rails Number of Stairs: 4 Height of Stairs: 6 Wheelchair Mobility Wheelchair Mobility: Yes Wheelchair Assistance: Chartered loss adjuster: Both upper extremities Wheelchair Parts Management: Needs assistance Distance: 100'  Trunk/Postural Assessment  Cervical Assessment Cervical Assessment: Exceptions to [facility] Cervical AROM Overall Cervical AROM Comments: Forward head Thoracic Assessment Thoracic Assessment: Exceptions to [facility] Thoracic AROM Overall Thoracic AROM Comments: Forward/rounded shoulders  Balance Balance Balance Assessed: Yes Static Sitting Balance Static Sitting - Level of Assistance: 7: Independent Dynamic Sitting Balance Dynamic Sitting - Level of Assistance: 5: Stand by assistance Sitting balance - Comments: S for safety Static Standing Balance Static Standing - Level of Assistance: 4: Min assist;5: Stand by assistance Dynamic Standing Balance Dynamic Standing - Level of Assistance: 3: Mod assist Extremity Assessment      RLE Assessment RLE Assessment: Exceptions to [facility] Strength Comments: Pt with poor hip flex, knee flex and ankle DF  (these grossly 3 to 3+/5), all others 4/5 LLE Assessment LLE Assessment: Exceptions to [facility] Strength Comments: Pt with poor hip flex, knee flex and ankle DF (grossly 3/5), all others 3+/5    Refer to Care Plan for Long Term Goals  Recommendations for other services: None  and Therapeutic Recreation  Outing/community reintegration  Discharge Criteria: Patient will be discharged from PT if patient refuses treatment 3 consecutive times without medical reason, if treatment goals not met, if there is a change in medical status, if patient makes no progress towards goals or if patient is discharged from hospital.  The above assessment, treatment plan, treatment alternatives and goals were discussed and mutually agreed upon: by patient  Denice Bors 04/27/2019, 10:44 AM

## 2019-04-27 NOTE — Progress Notes (Signed)
Patient information reviewed and entered into eRehab System by Becky Shelsey Rieth, PPS coordinator. Information including medical coding, function ability, and quality indicators will be reviewed and updated through discharge.   

## 2019-04-27 NOTE — Progress Notes (Signed)
The proposed treatment discussed in cancer conference 04/27/2019 is for discussion purpose only and is not a binding recommendation.  The patient was not physically examined nor present for their treatment options.  Therefore, final treatment plans cannot be decided.

## 2019-04-28 ENCOUNTER — Inpatient Hospital Stay (HOSPITAL_COMMUNITY): Payer: Self-pay | Admitting: Occupational Therapy

## 2019-04-28 ENCOUNTER — Inpatient Hospital Stay (HOSPITAL_COMMUNITY): Payer: Self-pay | Admitting: Physical Therapy

## 2019-04-28 DIAGNOSIS — I639 Cerebral infarction, unspecified: Secondary | ICD-10-CM

## 2019-04-28 DIAGNOSIS — E876 Hypokalemia: Secondary | ICD-10-CM

## 2019-04-28 LAB — POTASSIUM: Potassium: 4.1 mmol/L (ref 3.5–5.1)

## 2019-04-28 NOTE — Progress Notes (Signed)
Patient slept for a brief amount of time this shift. Given prn trazodone for sleep with minimal effects. Medicated x2 with morphine po for c/o pain to surgical site to back. Vital signs stable. Pt voiding throughout the night, no catheterization required.

## 2019-04-28 NOTE — Plan of Care (Signed)
  Problem: Consults Goal: RH SPINAL CORD INJURY PATIENT EDUCATION Description:  See Patient Education module for education specifics.  Outcome: Progressing Goal: Skin Care Protocol Initiated - if Braden Score 18 or less Description: If consults are not indicated, leave blank or document N/A Outcome: Progressing   Problem: SCI BOWEL ELIMINATION Goal: RH STG MANAGE BOWEL WITH ASSISTANCE Description: STG Manage Bowel with Assistance. Outcome: Progressing Goal: RH STG SCI MANAGE BOWEL WITH MEDICATION WITH ASSISTANCE Description: STG SCI Manage bowel with medication with assistance. Outcome: Progressing Goal: RH STG MANAGE BOWEL W/EQUIPMENT W/ASSISTANCE Description: STG Manage Bowel With Equipment With Assistance Outcome: Progressing Goal: RH STG SCI MANAGE BOWEL PROGRAM W/ASSIST OR AS APPROPRIATE Description: STG SCI Manage bowel program w/assist or as appropriate. Outcome: Progressing   Problem: RH SAFETY Goal: RH STG ADHERE TO SAFETY PRECAUTIONS W/ASSISTANCE/DEVICE Description: STG Adhere to Safety Precautions With Assistance/Device. Outcome: Progressing Goal: RH STG DECREASED RISK OF FALL WITH ASSISTANCE Description: STG Decreased Risk of Fall With Assistance. Outcome: Progressing

## 2019-04-28 NOTE — Plan of Care (Signed)
  Problem: Consults Goal: RH SPINAL CORD INJURY PATIENT EDUCATION Description:  See Patient Education module for education specifics.  Outcome: Progressing Goal: Skin Care Protocol Initiated - if Braden Score 18 or less Description: If consults are not indicated, leave blank or document N/A Outcome: Progressing Goal: Nutrition Consult-if indicated Outcome: Progressing   Problem: SCI BOWEL ELIMINATION Goal: RH STG MANAGE BOWEL WITH ASSISTANCE Description: STG Manage Bowel with Assistance. Outcome: Progressing Goal: RH STG SCI MANAGE BOWEL WITH MEDICATION WITH ASSISTANCE Description: STG SCI Manage bowel with medication with assistance. Outcome: Progressing Goal: RH STG MANAGE BOWEL W/EQUIPMENT W/ASSISTANCE Description: STG Manage Bowel With Equipment With Assistance Outcome: Progressing Goal: RH STG SCI MANAGE BOWEL PROGRAM W/ASSIST OR AS APPROPRIATE Description: STG SCI Manage bowel program w/assist or as appropriate. Outcome: Progressing Goal: RH OTHER STG BOWEL ELIMINATION GOALS W/ASSIST Description: Other STG Bowel Elimination Goals With Assistance. Outcome: Progressing   Problem: SCI BLADDER ELIMINATION Goal: RH STG MANAGE BLADDER WITH ASSISTANCE Description: STG Manage Bladder With Assistance Outcome: Progressing Goal: RH STG MANAGE BLADDER WITH MEDICATION WITH ASSISTANCE Description: STG Manage Bladder With Medication With Assistance. Outcome: Progressing Goal: RH STG MANAGE BLADDER WITH EQUIPMENT WITH ASSISTANCE Description: STG Manage Bladder With Equipment With Assistance Outcome: Progressing Goal: RH STG SCI MANAGE BLADDER PROGRAM W/ASSISTANCE Outcome: Progressing Goal: RH OTHER STG BLADDER ELIMINATION GOALS W/ASSIST Description: Other STG Bladder Elimination Goals With Assistance Outcome: Progressing   Problem: RH SKIN INTEGRITY Goal: RH STG SKIN FREE OF INFECTION/BREAKDOWN Outcome: Progressing Goal: RH STG MAINTAIN SKIN INTEGRITY WITH ASSISTANCE Description:  STG Maintain Skin Integrity With Assistance. Outcome: Progressing Goal: RH STG ABLE TO PERFORM INCISION/WOUND CARE W/ASSISTANCE Description: STG Able To Perform Incision/Wound Care With Assistance. Outcome: Progressing   Problem: RH SAFETY Goal: RH STG ADHERE TO SAFETY PRECAUTIONS W/ASSISTANCE/DEVICE Description: STG Adhere to Safety Precautions With Assistance/Device. Outcome: Progressing Goal: RH STG DECREASED RISK OF FALL WITH ASSISTANCE Description: STG Decreased Risk of Fall With Assistance. Outcome: Progressing   Problem: RH PAIN MANAGEMENT Goal: RH STG PAIN MANAGED AT OR BELOW PT'S PAIN GOAL Outcome: Progressing   Problem: RH KNOWLEDGE DEFICIT SCI Goal: RH STG INCREASE KNOWLEDGE OF SELF CARE AFTER SCI Outcome: Progressing

## 2019-04-28 NOTE — Progress Notes (Signed)
Physical Therapy Session Note  Patient Details  Name: Paul Thornton MRN: [id number] Date of Birth: 12/28/1960  Today's Date: 04/28/2019 PT Individual Time: 1103-1202 PT Individual Time Calculation (min): 59 min   Short Term Goals: Week 1:  PT Short Term Goal 1 (Week 1): Pt will perform bed mobility at S level with cues for maintaining precautions <25% of time. PT Short Term Goal 2 (Week 1): Pt will perform sit<>stand at min A level. PT Short Term Goal 3 (Week 1): Pt will perform stand pivot transfers at min A level w/ RW. PT Short Term Goal 4 (Week 1): Pt will ambulate x 100' with RW at min A consistently.  Skilled Therapeutic Interventions/Progress Updates:   Pt received sitting in WC and agreeable to PT. WC mobility 2 x 327f with supervision assist and cues  For safety in room. Nustep reciprocal movement training/endurance x 8 min with BLE cues of proper speed and full ROM intermittent. Gait training ewith RW 288f+ 3033f. Min  Assist for safety with min cues for heel toe gait pattern as well as decreased step length and increased step width. Dynamic balance training to preform reciprocal foot taps on 4 inch step 2 x 10 BLE, lateral reaches with 1 UE support 2 x 10 bil. Min assist from PT to prevent lateral LOB with cues for improved weight shift and neutral knee position. Pt returned to room and performed stand transfer to bed with RW and Min assist for safety. Sit>supine completed with supervision assist through long sitting, and left supine in bed with call bell in reach and all needs met.        Therapy Documentation Precautions:  Precautions Precautions: Back, Fall Precaution Comments: cues for back precautions during self care tasks Required Braces or Orthoses: Spinal Brace Spinal Brace: Thoracolumbosacral orthotic, Applied in sitting position Restrictions Weight Bearing Restrictions: No    Vital Signs: Oxygen Therapy O2 Device: Room Air 96% Pain: Pain Assessment Pain  Scale: 0-10 Pain Score: 7  Pain Type: Acute pain;Surgical pain Pain Location: Back Pain Orientation: Lower Pain Descriptors / Indicators: Aching Pain Frequency: Constant Pain Onset: On-going Patients Stated Pain Goal: 2 Pain Intervention(s): Medication (See eMAR)(tylenol given) Multiple Pain Sites: No    Therapy/Group: Individual Therapy  AusLorie Phenix3/2020, 12:15 PM

## 2019-04-28 NOTE — Progress Notes (Signed)
[place] PHYSICAL MEDICINE & REHABILITATION PROGRESS NOTE  Subjective/Complaints: Patient seen sitting up in bed this morning.  He states he slept well overnight.  He states he had a good first day of therapies yesterday.  ROS: Denies CP, shortness of breath, nausea, vomiting, diarrhea.  Objective: Vital Signs: Blood pressure 108/60, pulse 72, temperature 98.4 F (36.9 C), temperature source Oral, resp. rate 14, height 5\' 9"  (1.753 m), weight 41.1 kg, SpO2 98 %. Dg Abd 1 View  Result Date: 04/26/2019 CLINICAL DATA:  Epigastric pain x3 days EXAM: ABDOMEN - 1 VIEW COMPARISON:  April 21, 2019 FINDINGS: There is a large amount of stool throughout the colon. The liver is somewhat enlarged. The bowel gas pattern is nonobstructive and nonspecific. There is no acute osseous abnormality. There are degenerative changes throughout the lumbar spine and hips. The enteric tube has been removed. IMPRESSION: 1. Nonobstructive bowel gas pattern. 2. Above average amount of stool throughout the colon. Electronically Signed   By: Constance Holster M.D.   On: 04/26/2019 20:43   Recent Labs    04/27/19 0625  WBC 11.4*  HGB 10.9*  HCT 33.7*  PLT 475*   Recent Labs    04/27/19 0625  NA 135  K 3.5  CL 96*  CO2 31  GLUCOSE 99  BUN 14  CREATININE 0.70  CALCIUM 8.5*    Physical Exam: BP 108/60 (BP Location: Left Leg)   Pulse 72   Temp 98.4 F (36.9 C) (Oral)   Resp 14   Ht 5\' 9"  (1.753 m)   Wt 41.1 kg   SpO2 98%   BMI 13.38 kg/m  Constitutional: No distress . Vital signs reviewed.  Cachectic. HENT: Normocephalic.  Atraumatic. Eyes: EOMI.  No discharge. Cardiovascular: No JVD. Respiratory: Normal effort. GI: Non--distended. Musc: No edema or tenderness in extremities. Neurological: Alert and oriented. Motor: Bilateral upper extremities: 5/5 proximal distal Right lower extremity: 4+/5 proximal and distal Left lower extremity: 4/5 proximal to distal  Skin: Thoracic incision  C/D/I Psychiatric: He has a normal mood and affect. His behavior is normal.   Assessment/Plan: 1. 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  Physiatrist is providing close team supervision and 24 hour management of active medical problems listed below.  Physiatrist and rehab team continue to assess barriers to discharge/monitor patient progress toward functional and medical goals  Care Tool:  Bathing    Body parts bathed by patient: Right arm, Left arm, Chest, Abdomen, Front perineal area, Right upper leg, Left upper leg, Face   Body parts bathed by helper: Buttocks, Right lower leg, Left lower leg     Bathing assist Assist Level: Moderate Assistance - Patient 50 - 74%     Upper Body Dressing/Undressing Upper body dressing   What is the patient wearing?: Pull over shirt    Upper body assist Assist Level: Supervision/Verbal cueing    Lower Body Dressing/Undressing Lower body dressing      What is the patient wearing?: Pants     Lower body assist Assist for lower body dressing: Minimal Assistance - Patient > 75%     Toileting Toileting    Toileting assist Assist for toileting: Minimal Assistance - Patient > 75%     Transfers Chair/bed transfer  Transfers assist     Chair/bed transfer assist level: Minimal Assistance - Patient > 75%  Locomotion Ambulation   Ambulation assist      Assist level: Moderate Assistance - Patient 50 - 74% Assistive device: Walker-rolling Max distance: 80   Walk 10 feet activity   Assist     Assist level: Minimal Assistance - Patient > 75% Assistive device: Walker-rolling   Walk 50 feet activity   Assist Walk 50 feet with 2 turns activity did not occur: Safety/medical concerns  Assist level: Moderate Assistance - Patient - 50 - 74%      Walk 150 feet  activity   Assist Walk 150 feet activity did not occur: Safety/medical concerns         Walk 10 feet on uneven surface  activity   Assist Walk 10 feet on uneven surfaces activity did not occur: Safety/medical concerns         Wheelchair     Assist Will patient use wheelchair at discharge?: Yes Type of Wheelchair: Manual    Wheelchair assist level: Supervision/Verbal cueing Max wheelchair distance: 150    Wheelchair 50 feet with 2 turns activity    Assist        Assist Level: Supervision/Verbal cueing   Wheelchair 150 feet activity     Assist Wheelchair 150 feet activity did not occur: Safety/medical concerns   Assist Level: Supervision/Verbal cueing      Medical Problem List and Plan: 1.  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  Continue CIR 2.  Antithrombotics: -DVT/anticoagulation:  Pharmaceutical: Heparin             -antiplatelet therapy: ASA 3. Pain Management:  Hydrocodone prn 4. Mood: Team to provide ego support.    Added Xanax prn anxiety.              -antipsychotic agents: N/A 5. Neuropsych: This patient is capable of making decisions on his own behalf. 6. Skin/Wound Care: Routine pressure relief measures. Monitor wound daily.  7. Fluids/Electrolytes/Nutrition: Monitor I/O.   8. Lung cancer with bony mets: Follow-up heme/Onc recs. 9. Abdominal pain: Improving  KUB personally reviewed, showing constipation  See #13 10.  Leucocytosis: Monitor for signs of infection.    WBCs 11.4 on 7/2, labs ordered for Monday  Continue to monitor 11. Hypotension:  Monitor BP tid. Monitor for orthostatic symptoms  Controlled on 7/3  Monitor with increased mobility 12. COPD with SOB:  Continue Pulmicort nebs.  13.  Bowels:   MiraLAX twice daily ordered on 7/2  1 time bowel regimen given on 7/2  Improving on 7/3 14.  Hypokalemia  Potassium  3.5 on 7/2 and trending down, labs ordered  Continue to monitor 15.  Hypoalbuminemia  Supplement initiated on 7/2 16.  Acute blood loss anemia  Hemoglobin 10.9 on 7/2, labs ordered for Monday  Continue to monitor  LOS: 2 days A FACE TO FACE EVALUATION WAS PERFORMED  Raylei Losurdo Lorie Phenix 04/28/2019, 8:55 AM

## 2019-04-28 NOTE — Progress Notes (Signed)
Occupational Therapy Session Note  Patient Details  Name: Paul Thornton MRN: [id number] Date of Birth: 12-Apr-1961  Today's Date: 04/28/2019  Session 1 OT Individual Time: 9794-8016 OT Individual Time Calculation (min): 41 min   Session 2 OT Individual Time: 1410-1519 OT Individual Time Calculation (min): 69 min   Short Term Goals: Week 1:  OT Short Term Goal 1 (Week 1): patient will complete sit to stand and SPT with RW  CG/CS OT Short Term Goal 2 (Week 1): patient will complete dressing and bathing tasks at S level with good carryover of back precautions OT Short Term Goal 3 (Week 1): patient with complete basic HM with RW in stance with CG/CS  Skilled Therapeutic Interventions/Progress Updates:  Session 1   Pt greeted semi-reclined in bed and agreeable to OT treatment session. Pt reports just receiving pain medications and ready to participate. OT reviewed back precautions with pt with min verbal cues. He then came to sitting EOB with supervision. Pt able to don TLSO with min A. Squat-pivot bed>wc with min A. Bathing/dressing completed wc at the sink. Pt washed and dressed upper body with set-up A, then donned TLSO again prior to standing for LB bathing/dressing. Pt able to achieve figure 4 position to thread pant legs, and min A to stand at the sink to pull pants up. Figure 4 again to don socks and shoes with min A. Pt left seated in wc at end of session with alarm belt on and needs met.  Session 2 Pt greeted semi-reclined in bed and agreeable to OT treatment session. Pt donned shoes seated EOB with set-up A and verbal cues to maintain back precautions. Reviewed back precautions again with patient. Pt completed squat-pivot to wc with CGA. Pt propelled wc to therapy gym for UB strengthening. Stand-step-turn to NuStep with CGA and verbal cues for hand placement with sit<>stand. Pt completed 6 mins on level 3 of LEs only. Pt ambulated 10 feet to wc with min A, RW, and mod A to correct LOB when  turning to sit in wc. Pt breathing heavy s/p ambulation and reports feeling dizzy. HR 110, SpO2 92. Pt brought back to room and BP 99/68. Pt stood with min A and RW and BP taken again-76/64 in standing. Pt began breathing heavy again stating he couldn't breathe in the brace-looked like patient was about to have panic attack. Stand-pivot back to bed min A and brace immediately taken off. Pt reports history of anxiety and claustrophobia- exacerbated when wearing TLSO. Discussed taking breaks from TLSO when sitting up in wc with back supported but need to have brace on anytime he is getting up.  Continued working on LB strengthening at EOB after supine rest break. OT administered level 1 theraband. 3 sets of 10 hip adduction/abduction, knee flexion, seated hip extension. Pt returned to supine in bed with supervision. Nursing notified of patient status and request for anxiety medications. Pt left with bed alarm on and needs met.   Therapy Documentation Precautions:  Precautions Precautions: Back, Fall Precaution Comments: cues for back precautions during self care tasks Required Braces or Orthoses: Spinal Brace Spinal Brace: Thoracolumbosacral orthotic, Applied in sitting position Restrictions Weight Bearing Restrictions: No    04/28/19 1526  Therapy Vitals  BP (!) 76/64  Patient Position (if appropriate) Standing  Oxygen Therapy  SpO2 94 %   Pain: Pain Assessment Pain Scale: 0-10 Pain Score: 4  Pain Type: Surgical pain Pain Location: Back Pain Orientation: Lower Pain Descriptors / Indicators: Aching Pain  Onset: On-going Pain Intervention(s): Repositioned;Rest  Therapy/Group: Individual Therapy  Valma Cava 04/28/2019, 3:26 PM

## 2019-04-28 NOTE — Progress Notes (Signed)
Social Work Assessment and Plan   Patient Details  Name: Paul Thornton MRN: [id number] Date of Birth: 11-Jul-1961  Today's Date: 04/28/2019  Problem List:  Patient Active Problem List   Diagnosis Date Noted  . Sleep disturbance   . Acute cerebral infarction ([place])   . Hypokalemia   . Pain   . Acute blood loss anemia   . Hypoalbuminemia due to protein-calorie malnutrition ([place])   . Drug induced constipation   . Protein-calorie malnutrition, severe 04/26/2019  . Metastatic cancer to spine ([place]) 04/26/2019  . Acute exacerbation of chronic obstructive pulmonary disease (COPD) ([place])   . Diarrhea due to malabsorption   . Chronic obstructive pulmonary disease ([place])   . Hypotension   . Leukocytosis   . Primary malignant neoplasm of right lung metastatic to other site [facility]   . Cerebral embolism with cerebral infarction 04/21/2019  . 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 COPD exacerbation ([place]) 04/19/2019  . Tobacco abuse 04/19/2019  . Mass of right lung 04/19/2019  . GERD (gastroesophageal reflux disease) 04/19/2019  . Bronchitis 04/19/2019   Past Medical History:  Past Medical History:  Diagnosis Date  . Bronchitis   . Cancer ([place])   . COPD (chronic obstructive pulmonary disease) ([place])   . GERD (gastroesophageal reflux disease)   . Stroke [facility])    Past Surgical History:  Past Surgical History:  Procedure Laterality Date  . POSTERIOR LUMBAR FUSION 4 LEVEL N/A 04/20/2019   Procedure: POSTERIOR THORACIC FUSION 5 LEVEL;  Surgeon: Kristeen Miss, MD;  Location: [facility];  Service: Neurosurgery;  Laterality: N/A;  POSTERIOR THORACIC FUSION 5 LEVEL   Social History:  reports that he has quit smoking. He smoked 0.25 packs per day. He has never used smokeless tobacco. He reports that he does not drink alcohol or use drugs.  Family / Support Systems Marital Status: Single Patient Roles: Other (Comment)(provides support to  mother;  has siblings) Other Supports: sister, Paul Thornton @ [phone];  mother, Paul Thornton 56 yo- pt lives with her) @ [phone] Anticipated Caregiver: mother and sister Ability/Limitations of Caregiver: Supervision 24/7; Min A only 1x/day visit by sister who works during the day Caregiver Availability: 24/7 Family Dynamics: Pt describes very close relationship with his family and notes "it's been really hard not being able to see them."  Sister very concerned and involved in pt's care.  Social History Preferred language: English Religion:  Cultural Background: NA Read: Yes Write: Yes Employment Status: Unemployed Public relations account executive Issues: None Guardian/Conservator: None - per MD, pt is capable of making decisions on his own behalf.   Abuse/Neglect Abuse/Neglect Assessment Can Be Completed: Yes Physical Abuse: Denies Verbal Abuse: Denies Sexual Abuse: Denies Exploitation of patient/patient's resources: Denies Self-Neglect: Denies  Emotional Status Pt's affect, behavior and adjustment status: Pt very pleasant and completes assessment without any difficulty.  He does talk openly about feeling very "alone" since no visitors are allowed.  He also describes himself as "anxious" even PTA and "it's worse now."  Pt reports he is uncertain about further treatment plans "which doesn't make it any better."  Have referred for neuropsychology for additional emotional/ coping support. Recent Psychosocial Issues: None Psychiatric History: As noted, pt reports he has "had anxiety for a long time."  He was not taking any medications PTA due to no insurance.  Patient / Family Perceptions, Expectations & Goals Pt/Family understanding of illness & functional limitations: Pt and family with basic understanding  of his diagnosis, surgery performed and current functional limitations/ need for CIR.  They are not clear on what the ongoing treatment plans will be. Premorbid pt/family  roles/activities: Pt was independent overall PTA and assisting mother with home management. Anticipated changes in roles/activities/participation: Mother to provide supervision and sister/ other family will increase amount of support as well. Pt/family expectations/goals: "I just hope I can get around ok."  [employer]: None Premorbid Home Care/DME Agencies: None Transportation available at discharge: yes Resource referrals recommended: Neuropsychology  Discharge Planning Living Arrangements: Parent Support Systems: Parent, Other relatives Type of Residence: Private residence Insurance Resources: Teacher, adult education Resources: Family Support Financial Screen Referred: Previously completed(Financial Counselor - Counsellor) Living Expenses: Lives with family Money Management: Patient Does the patient have any problems obtaining your medications?: Yes (Describe)(uninsured) Home Management: Pt and mother Patient/Family Preliminary Plans: Pt to return home with elderly mother who can offer supervision only.  Sister, Paul Thornton, very supportive and will help as she can. Social Work Anticipated Follow Up Needs: HH/OP Expected length of stay: 12-14 days  Clinical Impression Frail appearing, unfortunate gentleman here following surgery for newly found metastatic CA.  Motivated for therapies but reports feeling very anxious about his diagnosis and "lonely" since visitors are not allowed. Family very supportive, however, elderly mother can provide only supervision. Will follow for support and d/c planning needs.  [place], [place] 04/28/2019, 4:21 PM

## 2019-04-29 ENCOUNTER — Inpatient Hospital Stay (HOSPITAL_COMMUNITY): Payer: Self-pay

## 2019-04-29 NOTE — Progress Notes (Signed)
Occupational Therapy Session Note  Patient Details  Name: MARLON SULEIMAN MRN: [id number] Date of Birth: December 10, 1960  Today's Date: 04/29/2019 OT Individual Time: 0762-2633 OT Individual Time Calculation (min): 15 min  and Today's Date: 04/29/2019 OT Missed Time: 31 Minutes Missed Time Reason: Nursing care   Short Term Goals: Week 1:  OT Short Term Goal 1 (Week 1): patient will complete sit to stand and SPT with RW  CG/CS OT Short Term Goal 2 (Week 1): patient will complete dressing and bathing tasks at S level with good carryover of back precautions OT Short Term Goal 3 (Week 1): patient with complete basic HM with RW in stance with CG/CS  Skilled Therapeutic Interventions/Progress Updates:    Pt resting in bed upon arrival with RN attending.  Attempted to engage pt in self care tasks but pt continued having difficulty with breathing and stating that he has gas and "can't get it up." Pt unable to continue with therapy.  RN remained in room with pt.   Therapy Documentation Precautions:  Precautions Precautions: Back, Fall Precaution Comments: cues for back precautions during self care tasks Required Braces or Orthoses: Spinal Brace Spinal Brace: Thoracolumbosacral orthotic, Applied in sitting position Restrictions Weight Bearing Restrictions: No General: General OT Amount of Missed Time: 45 Minutes Pain:  No c/o pain but pt anxious and states he has gas (needs to burp) and unable to; RN attended to pt     Therapy/Group: Individual Therapy  Leroy Libman 04/29/2019, 1:53 PM

## 2019-04-29 NOTE — Progress Notes (Signed)
Occupational Therapy Session Note  Patient Details  Name: Paul Thornton MRN: [id number] Date of Birth: 06/01/1961  Today's Date: 04/29/2019 OT Individual Time: 0700-0755 OT Individual Time Calculation (min): 55 min    Short Term Goals: Week 1:  OT Short Term Goal 1 (Week 1): patient will complete sit to stand and SPT with RW  CG/CS OT Short Term Goal 2 (Week 1): patient will complete dressing and bathing tasks at S level with good carryover of back precautions OT Short Term Goal 3 (Week 1): patient with complete basic HM with RW in stance with CG/CS  Skilled Therapeutic Interventions/Progress Updates:    Pt resting in bed upon arrival eating breakfast.  Pt agreeable to getting OOB to bathe and wash.  Pt sat EOB with supervision and donned TLSO with supervision.  Stand pivot transfer to w/c with supervision.  Pt completed all bathing/dressing tasks at supervisoin level with multiple rest breaks.  Pt SOB X 4 during session.  Pt remembered to don TLSO again when standing for LB bathing/dressing tasks.  Pt requested to return to bed for comfort at end of session.  SPT with supervison.  Pt remained in bed with all needs within reach and bed alarm activated.   Therapy Documentation Precautions:  Precautions Precautions: Back, Fall Precaution Comments: cues for back precautions during self care tasks Required Braces or Orthoses: Spinal Brace Spinal Brace: Thoracolumbosacral orthotic, Applied in sitting position Restrictions Weight Bearing Restrictions: No   Pain:  Pt c/o back discomfort (unrated) relieved with activity and repositioning   Therapy/Group: Individual Therapy  Leroy Libman 04/29/2019, 7:55 AM

## 2019-04-29 NOTE — Progress Notes (Signed)
[place] PHYSICAL MEDICINE & REHABILITATION PROGRESS NOTE  Subjective/Complaints: Patient seen sitting up in bed this AM.  He states she slept well overnight and states therapies are going well.   ROS: Denies CP, shortness of breath, nausea, vomiting, diarrhea.  Objective: Vital Signs: Blood pressure (!) 82/60, pulse (!) 103, temperature 98.9 F (37.2 C), temperature source Oral, resp. rate 18, height 5\' 9"  (1.753 m), weight 41.1 kg, SpO2 94 %. No results found. Recent Labs    04/27/19 0625  WBC 11.4*  HGB 10.9*  HCT 33.7*  PLT 475*   Recent Labs    04/27/19 0625 04/28/19 1224  NA 135  --   K 3.5 4.1  CL 96*  --   CO2 31  --   GLUCOSE 99  --   BUN 14  --   CREATININE 0.70  --   CALCIUM 8.5*  --     Physical Exam: BP (!) 82/60 (BP Location: Left Arm)   Pulse (!) 103   Temp 98.9 F (37.2 C) (Oral)   Resp 18   Ht 5\' 9"  (1.753 m)   Wt 41.1 kg   SpO2 94%   BMI 13.38 kg/m  Constitutional: No distress . Vital signs reviewed.  Cachectic. HENT: Normocephalic. Atraumatic. Eyes: EOMI. No discharge. Cardiovascular: No JVD. Respiratory: Normal effort. GI: Non-distended. Musc: No edema or tenderness in extremities. Neurological: Alert and oriented. Motor: Bilateral upper extremities: 5/5 proximal distal Right lower extremity: 4/5 proximal and distal (stronger than left) Left lower extremity: 4/5 proximal to distal  Skin: Thoracic incision C/D/I Psychiatric: He has a normal mood and affect. His behavior is normal.   Assessment/Plan: 1. 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  Physiatrist is providing close team supervision and 24 hour management of active medical problems listed below.  Physiatrist and rehab team continue to assess barriers to discharge/monitor patient progress toward functional and  medical goals  Care Tool:  Bathing    Body parts bathed by patient: Right arm, Left arm, Chest, Abdomen, Front perineal area, Right upper leg, Left upper leg, Face, Buttocks, Right lower leg, Left lower leg   Body parts bathed by helper: Buttocks, Right lower leg, Left lower leg     Bathing assist Assist Level: Contact Guard/Touching assist     Upper Body Dressing/Undressing Upper body dressing   What is the patient wearing?: Pull over shirt, Orthosis Orthosis activity level: Performed by patient  Upper body assist Assist Level: Supervision/Verbal cueing    Lower Body Dressing/Undressing Lower body dressing      What is the patient wearing?: Underwear/pull up, Pants     Lower body assist Assist for lower body dressing: Dependent - Patient 0%     Toileting Toileting    Toileting assist Assist for toileting: Minimal Assistance - Patient > 75%     Transfers Chair/bed transfer  Transfers assist     Chair/bed transfer assist level: Contact Guard/Touching assist Chair/bed transfer assistive device: Programmer, multimedia   Ambulation assist      Assist level: Contact Guard/Touching assist Assistive device: Walker-rolling Max distance: 35'   Walk 10 feet activity   Assist     Assist level: Contact Guard/Touching assist Assistive device: Walker-rolling   Walk 50 feet activity   Assist Walk 50 feet with 2 turns activity did not occur: Safety/medical concerns  Assist level:  Moderate Assistance - Patient - 50 - 74%      Walk 150 feet activity   Assist Walk 150 feet activity did not occur: Safety/medical concerns         Walk 10 feet on uneven surface  activity   Assist Walk 10 feet on uneven surfaces activity did not occur: Safety/medical concerns         Wheelchair     Assist Will patient use wheelchair at discharge?: Yes Type of Wheelchair: Manual    Wheelchair assist level: Supervision/Verbal cueing Max wheelchair  distance: 300'    Wheelchair 50 feet with 2 turns activity    Assist        Assist Level: Supervision/Verbal cueing   Wheelchair 150 feet activity     Assist Wheelchair 150 feet activity did not occur: Safety/medical concerns   Assist Level: Supervision/Verbal cueing      Medical Problem List and Plan: 1.  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  Continue CIR 2.  Antithrombotics: -DVT/anticoagulation:  Pharmaceutical: Heparin             -antiplatelet therapy: ASA 3. Pain Management:  Hydrocodone prn 4. Mood: Team to provide ego support.    Added Xanax prn anxiety.              -antipsychotic agents: N/A 5. Neuropsych: This patient is capable of making decisions on his own behalf. 6. Skin/Wound Care: Routine pressure relief measures. Monitor wound daily.  7. Fluids/Electrolytes/Nutrition: Monitor I/Os.   8. Lung cancer with bony mets: Follow-up heme/Onc recs. 9. Abdominal pain: Improved  KUB personally reviewed, showing constipation  See #13 10.  Leucocytosis: Monitor for signs of infection.    WBCs 11.4 on 7/2, labs ordered for Monday  Continue to monitor 11. Hypotension:  Monitor BP tid. Monitor for orthostatic symptoms  Hypotensive on 7/4  Monitor with increased mobility 12. COPD with SOB:  Continue Pulmicort nebs.  13.  Bowels:   MiraLAX twice daily ordered on 7/2  1 time bowel regimen given on 7/2  Improving on 7/3 14.  Hypokalemia  Potassium 4.1 on 7/3, labs ordered for Monday  Continue to monitor 15.  Hypoalbuminemia  Supplement initiated on 7/2 16.  Acute blood loss anemia  Hemoglobin 10.9 on 7/2, labs ordered for Monday  Continue to monitor  LOS: 3 days A FACE TO FACE EVALUATION WAS PERFORMED  Paul Thornton Paul Thornton 04/29/2019, 2:53 PM

## 2019-04-29 NOTE — Progress Notes (Signed)
Physical Therapy Session Note  Patient Details  Name: Paul Thornton MRN: [id number] Date of Birth: January 11, 1961  Today's Date: 04/29/2019 PT Individual Time: 0930-1030 PT Individual Time Calculation (min): 60 min   Short Term Goals: Week 1:  PT Short Term Goal 1 (Week 1): Pt will perform bed mobility at S level with cues for maintaining precautions <25% of time. PT Short Term Goal 2 (Week 1): Pt will perform sit<>stand at min A level. PT Short Term Goal 3 (Week 1): Pt will perform stand pivot transfers at min A level w/ RW. PT Short Term Goal 4 (Week 1): Pt will ambulate x 100' with RW at min A consistently.  Skilled Therapeutic Interventions/Progress Updates:     Patient in bed upon PT arrival. Patient alert and agreeable to PT session.  Therapeutic Activity: Bed Mobility: Patient performed supine to/from sit with supervision with bed flat and without use of rail. Provided verbal cues for log rolling to maintain spinal precautions. Patient don. Transfers: Patient performed sit to/from stand x6 with CGA-close supervision using a RW. Provided verbal cues for .  Gait Training:  Patient ambulated 15 feet, 25 feet and 35 feet using RW with CGA. Ambulated with decreased BOS, decreased step height and toe clearance L>R, intermittent scissoring gait, and downward head gaze. Provided verbal cues for looking ahead, increased hip flexion to compensate for toe drag, and wide BOS.  Wheelchair Mobility:  Patient propelled wheelchair 250 feet and 300 feet x2 with supervision for improved UE strength and activity tolerance. Provided verbal cues for donning/doffing leg rests and use of breaks throughout session.  Therapeutic Exercise: Patient performed the following exercises with verbal and tactile cues for proper technique. -NuStep workload 3 for 3 min, then on 4 for 30 second intervals and back down to three for 1 min intervals. HR 100, O2 92%, RPE 0/10 at start, HR 112, O2 93%, RPE 5/10 at minute 6,  HR 113, O2 98%, RPE 7/10 at end.  -standing heel raised x10 with B UE support -standing toe raises x10 with B UE support -B Standing hamstring curls x10 with B UE support. -Sit to/from stands x5 for LE strengthening from mat table  Patient in bed at end of session with breaks locked, bed alarm set, and all needs within reach.    Therapy Documentation Precautions:  Precautions Precautions: Back, Fall Precaution Comments: cues for back precautions during self care tasks Required Braces or Orthoses: Spinal Brace Spinal Brace: Thoracolumbosacral orthotic, Applied in sitting position Restrictions Weight Bearing Restrictions: No General:   Vital Signs: Therapy Vitals Pulse Rate: 91 BP: 94/60 Patient Position (if appropriate): Lying Pain: Pain Assessment Pain Scale: 0-10 Pain Score: 7  Pain Type: Acute pain Pain Location: Back Pain Orientation: Lower Pain Descriptors / Indicators: Aching;Sharp Pain Frequency: Intermittent Pain Onset: On-going Pain Intervention(s): repositioning;distraction   Therapy/Group: Individual Therapy  Sabiha Sura L Sora Vrooman PT, DPT  04/29/2019, 12:54 PM

## 2019-04-29 NOTE — Plan of Care (Signed)
  Problem: Consults Goal: RH SPINAL CORD INJURY PATIENT EDUCATION Description:  See Patient Education module for education specifics.  Outcome: Progressing Goal: Skin Care Protocol Initiated - if Braden Score 18 or less Description: If consults are not indicated, leave blank or document N/A Outcome: Progressing Goal: Nutrition Consult-if indicated Outcome: Progressing   Problem: SCI BOWEL ELIMINATION Goal: RH STG MANAGE BOWEL WITH ASSISTANCE Description: STG Manage Bowel with Assistance. Outcome: Progressing Goal: RH STG SCI MANAGE BOWEL WITH MEDICATION WITH ASSISTANCE Description: STG SCI Manage bowel with medication with assistance. Outcome: Progressing Goal: RH STG MANAGE BOWEL W/EQUIPMENT W/ASSISTANCE Description: STG Manage Bowel With Equipment With Assistance Outcome: Progressing Goal: RH STG SCI MANAGE BOWEL PROGRAM W/ASSIST OR AS APPROPRIATE Description: STG SCI Manage bowel program w/assist or as appropriate. Outcome: Progressing Goal: RH OTHER STG BOWEL ELIMINATION GOALS W/ASSIST Description: Other STG Bowel Elimination Goals With Assistance. Outcome: Progressing   Problem: SCI BLADDER ELIMINATION Goal: RH STG MANAGE BLADDER WITH ASSISTANCE Description: STG Manage Bladder With Assistance Outcome: Progressing Goal: RH STG MANAGE BLADDER WITH MEDICATION WITH ASSISTANCE Description: STG Manage Bladder With Medication With Assistance. Outcome: Progressing Goal: RH STG MANAGE BLADDER WITH EQUIPMENT WITH ASSISTANCE Description: STG Manage Bladder With Equipment With Assistance Outcome: Progressing Goal: RH STG SCI MANAGE BLADDER PROGRAM W/ASSISTANCE Outcome: Progressing Goal: RH OTHER STG BLADDER ELIMINATION GOALS W/ASSIST Description: Other STG Bladder Elimination Goals With Assistance Outcome: Progressing   Problem: RH SKIN INTEGRITY Goal: RH STG SKIN FREE OF INFECTION/BREAKDOWN Outcome: Progressing Goal: RH STG MAINTAIN SKIN INTEGRITY WITH ASSISTANCE Description:  STG Maintain Skin Integrity With Assistance. Outcome: Progressing Goal: RH STG ABLE TO PERFORM INCISION/WOUND CARE W/ASSISTANCE Description: STG Able To Perform Incision/Wound Care With Assistance. Outcome: Progressing   Problem: RH SAFETY Goal: RH STG ADHERE TO SAFETY PRECAUTIONS W/ASSISTANCE/DEVICE Description: STG Adhere to Safety Precautions With Assistance/Device. Outcome: Progressing Goal: RH STG DECREASED RISK OF FALL WITH ASSISTANCE Description: STG Decreased Risk of Fall With Assistance. Outcome: Progressing   Problem: RH PAIN MANAGEMENT Goal: RH STG PAIN MANAGED AT OR BELOW PT'S PAIN GOAL Outcome: Progressing   Problem: RH KNOWLEDGE DEFICIT SCI Goal: RH STG INCREASE KNOWLEDGE OF SELF CARE AFTER SCI Outcome: Progressing

## 2019-04-30 NOTE — Progress Notes (Signed)
[place] PHYSICAL MEDICINE & REHABILITATION PROGRESS NOTE  Subjective/Complaints: Patient seen sitting up in bed this morning.  He states he slept well overnight.  He has questions about deseeding his staples.  ROS: Denies CP, shortness of breath, nausea, vomiting, diarrhea.  Objective: Vital Signs: Blood pressure 97/70, pulse 79, temperature 98.5 F (36.9 C), temperature source Oral, resp. rate 18, height 5\' 9"  (1.753 m), weight 41.1 kg, SpO2 98 %. No results found. No results for input(s): WBC, HGB, HCT, PLT in the last 72 hours. Recent Labs    04/28/19 1224  K 4.1    Physical Exam: BP 97/70 (BP Location: Left Arm)   Pulse 79   Temp 98.5 F (36.9 C) (Oral)   Resp 18   Ht 5\' 9"  (1.753 m)   Wt 41.1 kg   SpO2 98%   BMI 13.38 kg/m  Constitutional: No distress . Vital signs reviewed.  Cachectic. HENT: Cephalic.  Atraumatic. Eyes: EOMI.  No discharge. Cardiovascular: No JVD. Respiratory: Normal effort. GI: Non-distended. Musc: No edema or tenderness in extremities. Neurological: Alert and oriented. Motor: Bilateral upper extremities: 5/5 proximal distal Right lower extremity: 4/5 proximal and distal (stronger than left), improving Left lower extremity: 4/5 proximal to distal, improving Skin: Thoracic incision C/D/I Psychiatric: He has a normal mood and affect. His behavior is normal.   Assessment/Plan: 1. 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  Physiatrist is providing close team supervision and 24 hour management of active medical problems listed below.  Physiatrist and rehab team continue to assess barriers to discharge/monitor patient progress toward functional and medical goals  Care Tool:  Bathing    Body parts bathed by patient: Right arm, Left arm, Chest, Abdomen, Front perineal area, Right  upper leg, Left upper leg, Face, Buttocks, Right lower leg, Left lower leg   Body parts bathed by helper: Buttocks, Right lower leg, Left lower leg     Bathing assist Assist Level: Contact Guard/Touching assist     Upper Body Dressing/Undressing Upper body dressing   What is the patient wearing?: Pull over shirt, Orthosis Orthosis activity level: Performed by patient  Upper body assist Assist Level: Supervision/Verbal cueing    Lower Body Dressing/Undressing Lower body dressing      What is the patient wearing?: Underwear/pull up, Pants     Lower body assist Assist for lower body dressing: Dependent - Patient 0%     Toileting Toileting    Toileting assist Assist for toileting: Minimal Assistance - Patient > 75%     Transfers Chair/bed transfer  Transfers assist     Chair/bed transfer assist level: Moderate Assistance - Patient 50 - 74% Chair/bed transfer assistive device: Museum/gallery exhibitions officer assist      Assist level: Contact Guard/Touching assist Assistive device: Walker-rolling Max distance: 35'   Walk 10 feet activity   Assist     Assist level: Contact Guard/Touching assist Assistive device: Walker-rolling   Walk 50 feet activity   Assist Walk 50 feet with 2 turns activity did not occur: Safety/medical concerns  Assist level: Moderate Assistance - Patient - 50 - 74%      Walk 150 feet activity   Assist Walk 150 feet activity did not occur: Safety/medical concerns         Walk 10 feet on uneven surface  activity   Assist Walk  10 feet on uneven surfaces activity did not occur: Safety/medical concerns         Wheelchair     Assist Will patient use wheelchair at discharge?: Yes Type of Wheelchair: Manual    Wheelchair assist level: Supervision/Verbal cueing Max wheelchair distance: 300'    Wheelchair 50 feet with 2 turns activity    Assist        Assist Level: Supervision/Verbal cueing    Wheelchair 150 feet activity     Assist Wheelchair 150 feet activity did not occur: Safety/medical concerns   Assist Level: Supervision/Verbal cueing      Medical Problem List and Plan: 1.  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  Continue CIR  Plan to DC staples ~7/8 2.  Antithrombotics: -DVT/anticoagulation:  Pharmaceutical: Heparin             -antiplatelet therapy: ASA 3. Pain Management:  Hydrocodone prn 4. Mood: Team to provide ego support.    Added Xanax prn anxiety.              -antipsychotic agents: N/A 5. Neuropsych: This patient is capable of making decisions on his own behalf. 6. Skin/Wound Care: Routine pressure relief measures. Monitor wound daily.  7. Fluids/Electrolytes/Nutrition: Monitor I/Os.   8. Lung cancer with bony mets: Follow-up heme/Onc recs. 9. Abdominal pain: Resolved  KUB personally reviewed, showing constipation  See #13 10.  Leucocytosis: Monitor for signs of infection.    WBCs 11.4 on 7/2, labs ordered for tomorrow  Continue to monitor 11. Hypotension:  Monitor BP tid. Monitor for orthostatic symptoms  Soft, but asymptomatic on 7/5  Monitor with increased mobility 12. COPD with SOB:  Continue Pulmicort nebs.  13.  Bowels:   MiraLAX twice daily ordered on 7/2  1 time bowel regimen given on 7/2  Improving on 7/3 14.  Hypokalemia  Potassium 4.1 on 7/3, labs ordered for tomorrow  Continue to monitor 15.  Hypoalbuminemia  Supplement initiated on 7/2 16.  Acute blood loss anemia  Hemoglobin 10.9 on 7/2, labs ordered for tomorrow  Continue to monitor  LOS: 4 days A FACE TO FACE EVALUATION WAS PERFORMED  Dhairya Corales Lorie Phenix 04/30/2019, 11:23 AM

## 2019-05-01 ENCOUNTER — Inpatient Hospital Stay (HOSPITAL_COMMUNITY): Payer: Self-pay

## 2019-05-01 ENCOUNTER — Inpatient Hospital Stay (HOSPITAL_COMMUNITY): Payer: Self-pay | Admitting: Occupational Therapy

## 2019-05-01 ENCOUNTER — Inpatient Hospital Stay (HOSPITAL_COMMUNITY): Payer: Self-pay | Admitting: Physical Therapy

## 2019-05-01 DIAGNOSIS — E876 Hypokalemia: Secondary | ICD-10-CM

## 2019-05-01 DIAGNOSIS — I639 Cerebral infarction, unspecified: Secondary | ICD-10-CM

## 2019-05-01 DIAGNOSIS — G479 Sleep disorder, unspecified: Secondary | ICD-10-CM

## 2019-05-01 LAB — CBC WITH DIFFERENTIAL/PLATELET
Abs Immature Granulocytes: 0.15 10*3/uL — ABNORMAL HIGH (ref 0.00–0.07)
Basophils Absolute: 0 10*3/uL (ref 0.0–0.1)
Basophils Relative: 0 %
Eosinophils Absolute: 0.1 10*3/uL (ref 0.0–0.5)
Eosinophils Relative: 1 %
HCT: 36 % — ABNORMAL LOW (ref 39.0–52.0)
Hemoglobin: 11.7 g/dL — ABNORMAL LOW (ref 13.0–17.0)
Immature Granulocytes: 1 %
Lymphocytes Relative: 14 %
Lymphs Abs: 2.1 10*3/uL (ref 0.7–4.0)
MCH: 29.9 pg (ref 26.0–34.0)
MCHC: 32.5 g/dL (ref 30.0–36.0)
MCV: 92.1 fL (ref 80.0–100.0)
Monocytes Absolute: 1.3 10*3/uL — ABNORMAL HIGH (ref 0.1–1.0)
Monocytes Relative: 9 %
Neutro Abs: 11 10*3/uL — ABNORMAL HIGH (ref 1.7–7.7)
Neutrophils Relative %: 75 %
Platelets: 539 10*3/uL — ABNORMAL HIGH (ref 150–400)
RBC: 3.91 MIL/uL — ABNORMAL LOW (ref 4.22–5.81)
RDW: 15.8 % — ABNORMAL HIGH (ref 11.5–15.5)
WBC: 14.8 10*3/uL — ABNORMAL HIGH (ref 4.0–10.5)
nRBC: 0 % (ref 0.0–0.2)

## 2019-05-01 LAB — BASIC METABOLIC PANEL
Anion gap: 8 (ref 5–15)
BUN: 17 mg/dL (ref 6–20)
CO2: 29 mmol/L (ref 22–32)
Calcium: 8.8 mg/dL — ABNORMAL LOW (ref 8.9–10.3)
Chloride: 93 mmol/L — ABNORMAL LOW (ref 98–111)
Creatinine, Ser: 0.65 mg/dL (ref 0.61–1.24)
GFR calc Af Amer: >60 mL/min (ref >60)
GFR calc non Af Amer: >60 mL/min (ref >60)
Glucose, Bld: 101 mg/dL — ABNORMAL HIGH (ref 70–99)
Potassium: 4.1 mmol/L (ref 3.5–5.1)
Sodium: 130 mmol/L — ABNORMAL LOW (ref 135–145)

## 2019-05-01 MED ORDER — ZOLPIDEM TARTRATE 5 MG PO TABS
5.0000 mg | ORAL_TABLET | Freq: Every day | ORAL | Status: DC
  Administered 2019-05-01 – 2019-05-05 (×4): 5 mg via ORAL
  Filled 2019-05-01 (×4): qty 1

## 2019-05-01 NOTE — Progress Notes (Signed)
Physical Therapy Session Note  Patient Details  Name: Paul Thornton MRN: [id number] Date of Birth: 1961-06-15  Today's Date: 05/01/2019 PT Individual Time: 1333-1400 PT Individual Time Calculation (min): 27 min   Short Term Goals: Week 1:  PT Short Term Goal 1 (Week 1): Pt will perform bed mobility at S level with cues for maintaining precautions <25% of time. PT Short Term Goal 2 (Week 1): Pt will perform sit<>stand at min A level. PT Short Term Goal 3 (Week 1): Pt will perform stand pivot transfers at min A level w/ RW. PT Short Term Goal 4 (Week 1): Pt will ambulate x 100' with RW at min A consistently.  Skilled Therapeutic Interventions/Progress Updates: Pt presented in w/c agreeable to therapy. Pt stating some discomfort in back but does not require pain meds at this point. Pt transferred to day room for energy conservation and time management. Performed ambulatory transfer to NuStep and participated in NuStep L1 x 3 min for global conditioning. Per pt after activity 5/10 on modified BORG scale. Performed additional 2 bouts L2 BLE only for strengthening, pt able to maintain approx 25-30 SPM with mBORG  7 and 8/10 respectively. Performed STS From NuStep x 3 for BLE with cues for decreasing BLE lean against NuStep. Pt then performed ambulatory transfer back to w/c in same manner as prior. Pt transported partial distance to room and propelled remaining distance. Pt remained in w/c at end of session and left with belt alarm on, and needs met.      Therapy Documentation Precautions:  Precautions Precautions: Back, Fall Precaution Comments: cues for back precautions during self care tasks Required Braces or Orthoses: Spinal Brace Spinal Brace: Thoracolumbosacral orthotic, Applied in sitting position Restrictions Weight Bearing Restrictions: No General:   Vital Signs: Therapy Vitals Temp: (!) 97.3 F (36.3 C) Temp Source: Oral Pulse Rate: 92 Resp: 19 BP: 92/64 Oxygen Therapy SpO2:  99 % O2 Device: Room Air Pain: Pain Assessment Pain Scale: 0-10 Pain Score: 0-No pain Mobility:   Locomotion :    Trunk/Postural Assessment :    Balance:   Exercises:   Other Treatments:      Therapy/Group: Individual Therapy  [name] 05/01/2019, 4:22 PM

## 2019-05-01 NOTE — Progress Notes (Signed)
Occupational Therapy Session Note  Patient Details  Name: Paul Thornton MRN: [id number] Date of Birth: Oct 08, 1961  Today's Date: 05/01/2019 OT Individual Time: 1000-1111 OT Individual Time Calculation (min): 71 min    Short Term Goals: Week 1:  OT Short Term Goal 1 (Week 1): patient will complete sit to stand and SPT with RW  CG/CS OT Short Term Goal 2 (Week 1): patient will complete dressing and bathing tasks at S level with good carryover of back precautions OT Short Term Goal 3 (Week 1): patient with complete basic HM with RW in stance with CG/CS  Skilled Therapeutic Interventions/Progress Updates:    Patient seated in w/c and ready for therapy session.  Able to propel w/c to/from therapy treatment areas.  SPT w/ RW to/from w/c, therapy equipment with CGA.  Completed nustep 2 x6 minutes.  UB conditioning activities with rest breaks needed.  Standing 3 x 1-2 minutes with SOB and rest breaks needed.  Provided theraband and theraputty.  Patient returned to room, remained seated in w/c with seat alarm set and call bell in reach.    Therapy Documentation Precautions:  Precautions Precautions: Back, Fall Precaution Comments: cues for back precautions during self care tasks Required Braces or Orthoses: Spinal Brace Spinal Brace: Thoracolumbosacral orthotic, Applied in sitting position Restrictions Weight Bearing Restrictions: No General:   Vital Signs:   Pain: Pain Assessment Pain Scale: 0-10 Pain Score: 0-No pain Other Treatments:     Therapy/Group: Individual Therapy  Carlos Levering 05/01/2019, 1:02 PM

## 2019-05-01 NOTE — Progress Notes (Signed)
Occupational Therapy Session Note  Patient Details  Name: Paul Thornton MRN: [id number] Date of Birth: 12-26-1960  Today's Date: 05/01/2019 OT Individual Time: 3317-4099 OT Individual Time Calculation (min): 45 min    Short Term Goals: Week 1:  OT Short Term Goal 1 (Week 1): patient will complete sit to stand and SPT with RW  CG/CS OT Short Term Goal 2 (Week 1): patient will complete dressing and bathing tasks at S level with good carryover of back precautions OT Short Term Goal 3 (Week 1): patient with complete basic HM with RW in stance with CG/CS  Skilled Therapeutic Interventions/Progress Updates:    Pt received supine with c/o pain 5/10 with movement but no request for pain intervention. Pt completed bed mobility with moderate adherence to back precautions with (S) overall. Pt donned TLSO with min A to adjust straps. Edu pt on straps versatility going over or under arms. Pt completed ambulatory transfer to sink with CGA using RW. Pt washed UB and completed grooming tasks with set up assist. CGA for sit <> stand to complete LB  Bathing in standing. Cueing provided for fall risk reduction to complete LB clothing doffing seated instead of standing. Discussed activity pacing and impact on energy conservation. Pt able to wash feet and don socks seated with figure 4 method with set up assist. Pt propelled chair to therapy gym where he transferred to mat with CGA. Pt completed 3x 10 sets of mini squats to improve eccentric control when lowering to chair. Pt returned to room and left sitting up with all needs met, chair alarm set.   Therapy Documentation Precautions:  Precautions Precautions: Back, Fall Precaution Comments: cues for back precautions during self care tasks Required Braces or Orthoses: Spinal Brace Spinal Brace: Thoracolumbosacral orthotic, Applied in sitting position Restrictions Weight Bearing Restrictions: No   Therapy/Group: Individual Therapy  Curtis Sites 05/01/2019,  7:21 AM

## 2019-05-01 NOTE — Progress Notes (Signed)
Physical Therapy Session Note  Patient Details  Name: Paul Thornton MRN: [id number] Date of Birth: 1961-06-19  Today's Date: 05/01/2019 PT Individual Time: 1425-1520 PT Individual Time Calculation (min): 55 min   Short Term Goals: Week 1:  PT Short Term Goal 1 (Week 1): Pt will perform bed mobility at S level with cues for maintaining precautions <25% of time. PT Short Term Goal 2 (Week 1): Pt will perform sit<>stand at min A level. PT Short Term Goal 3 (Week 1): Pt will perform stand pivot transfers at min A level w/ RW. PT Short Term Goal 4 (Week 1): Pt will ambulate x 100' with RW at min A consistently.  Skilled Therapeutic Interventions/Progress Updates:   Pt in w/c and agreeable to therapy, pain as detailed below. Pt self-propelled w/c to/from therapy gym w/ supervision using BUEs for endurance training. Worked on LE strengthening and endurance this session. Sit<>stands to RW w/ min assist and ambulated 30' x2 w/ CGA-min assist. LE exercises in stance including knee marches 2x20 to target, partial knee bends 2x10. Pt noted to be hyperextending LLE in single leg stance. LLE step up to 2" step, 3x10. Manual and verbal cues for knee control to prevent hyperextension. Progressed to performing w/ unilateral UE support vs bilateral on RW, min assist for balance. L heel slides w/ orange theraband resistance 3x10 reps, tactile cues for hamstring activation. Kinetron LLE w/ therapist providing resistance on other pedal to work on both hamstring and glut activation, 3x10 reps. Returned to room via w/c, ended session in supine and all needs in reach. Ice applied to L knee for soreness prevention. Frequent rest breaks throughout session 2/2 increased work of breathing.   Therapy Documentation Precautions:  Precautions Precautions: Back, Fall Precaution Comments: cues for back precautions during self care tasks Required Braces or Orthoses: Spinal Brace Spinal Brace: Thoracolumbosacral orthotic,  Applied in sitting position Restrictions Weight Bearing Restrictions: No Vital Signs: Therapy Vitals Temp: (!) 97.3 F (36.3 C) Temp Source: Oral Pulse Rate: 92 Resp: 19 BP: 92/64 Oxygen Therapy SpO2: 99 % O2 Device: Room Air Pain: Pain Assessment Pain Scale: 0-10 Pain Score: 0-No pain  Therapy/Group: Individual Therapy  Dolora Ridgely Clent Demark 05/01/2019, 3:27 PM

## 2019-05-01 NOTE — Care Management (Signed)
[facility] Individual Statement of Services  Patient Name:  Paul Thornton  Date:  05/01/2019  Welcome to the [facility].  Our goal is to provide you with an individualized program based on your diagnosis and situation, designed to meet your specific needs.  With this comprehensive rehabilitation program, you will be expected to participate in at least 3 hours of rehabilitation therapies Monday-Friday, with modified therapy programming on the weekends.  Your rehabilitation program will include the following services:  Physical Therapy (PT), Occupational Therapy (OT), 24 hour per day rehabilitation nursing, Therapeutic Recreaction (TR), Neuropsychology, Case Management (Social Worker), Rehabilitation Medicine, Nutrition Services and Pharmacy Services  Weekly team conferences will be held on Wednesdays to discuss your progress.  Your Social Worker will talk with you frequently to get your input and to update you on team discussions.  Team conferences with you and your family in attendance may also be held.  Expected length of stay: 12-14 days   Overall anticipated outcome: supervision  Depending on your progress and recovery, your program may change. Your Social Worker will coordinate services and will keep you informed of any changes. Your Social Worker's name and contact numbers are listed  below.  The following services may also be recommended but are not provided by the [facility] will be made to provide these services after discharge if needed.  Arrangements include referral to agencies that provide these services.  Your insurance has been verified to be:  None  Your primary doctor is:  None  Pertinent information will be shared with your doctor and your insurance company.  Social Worker:  [place], [place] or [phone]   Information discussed with and copy given to patient by: Lennart Pall, 05/01/2019, 12:50 PM

## 2019-05-01 NOTE — Progress Notes (Signed)
[facility] PHYSICAL MEDICINE & REHABILITATION PROGRESS NOTE  Subjective/Complaints: Patient seen sitting up in bed this morning.  He states he did not sleep well overnight and request medications.  ROS: Denies CP, shortness of breath, nausea, vomiting, diarrhea.  Objective: Vital Signs: Blood pressure 94/67, pulse 76, temperature 98.6 F (37 C), temperature source Oral, resp. rate 18, height 5\' 9"  (1.753 m), weight 44.5 kg, SpO2 97 %. No results found. No results for input(s): WBC, HGB, HCT, PLT in the last 72 hours. Recent Labs    04/28/19 1224  K 4.1    Physical Exam: BP 94/67 (BP Location: Left Arm)   Pulse 76   Temp 98.6 F (37 C) (Oral)   Resp 18   Ht 5\' 9"  (1.753 m)   Wt 44.5 kg   SpO2 97%   BMI 14.50 kg/m  Constitutional: No distress . Vital signs reviewed.  Cachectic. HENT: Normocephalic.  Atraumatic. Eyes: EOMI.  No discharge. Cardiovascular: No JVD. Respiratory: Normal effort. GI: Non-distended. Musc: No edema or tenderness in extremities. Neurological: Alert and oriented. Motor: Bilateral upper extremities: 5/5 proximal distal Right lower extremity: 4/5 proximal and distal (stronger than left), stable Left lower extremity: 4/5 proximal to distal, improving, stable Skin: Thoracic incision with staples C/D/I Psychiatric: He has a normal mood and affect. His behavior is normal.   Assessment/Plan: 1. 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  Physiatrist is providing close team supervision and 24 hour management of active medical problems listed below.  Physiatrist and rehab team continue to assess barriers to discharge/monitor patient progress toward functional and medical goals  Care Tool:  Bathing    Body parts bathed by patient: Right arm, Left arm, Chest, Abdomen, Front perineal area, Right  upper leg, Left upper leg, Face, Buttocks, Right lower leg, Left lower leg   Body parts bathed by helper: Buttocks, Right lower leg, Left lower leg     Bathing assist Assist Level: Contact Guard/Touching assist     Upper Body Dressing/Undressing Upper body dressing   What is the patient wearing?: Pull over shirt, Orthosis Orthosis activity level: Performed by patient  Upper body assist Assist Level: Supervision/Verbal cueing    Lower Body Dressing/Undressing Lower body dressing      What is the patient wearing?: Underwear/pull up, Pants     Lower body assist Assist for lower body dressing: Dependent - Patient 0%     Toileting Toileting    Toileting assist Assist for toileting: Minimal Assistance - Patient > 75%     Transfers Chair/bed transfer  Transfers assist     Chair/bed transfer assist level: Moderate Assistance - Patient 50 - 74% Chair/bed transfer assistive device: Museum/gallery exhibitions officer assist      Assist level: Contact Guard/Touching assist Assistive device: Walker-rolling Max distance: 35'   Walk 10 feet activity   Assist     Assist level: Contact Guard/Touching assist Assistive device: Walker-rolling   Walk 50 feet activity   Assist Walk 50 feet with 2 turns activity did not occur: Safety/medical concerns  Assist level: Moderate Assistance - Patient - 50 - 74%      Walk 150 feet activity   Assist Walk 150 feet activity did not occur: Safety/medical concerns         Walk 10 feet on uneven surface  activity   Assist Walk  10 feet on uneven surfaces activity did not occur: Safety/medical concerns         Wheelchair     Assist Will patient use wheelchair at discharge?: Yes Type of Wheelchair: Manual    Wheelchair assist level: Supervision/Verbal cueing Max wheelchair distance: 300'    Wheelchair 50 feet with 2 turns activity    Assist        Assist Level: Supervision/Verbal cueing    Wheelchair 150 feet activity     Assist Wheelchair 150 feet activity did not occur: Safety/medical concerns   Assist Level: Supervision/Verbal cueing      Medical Problem List and Plan: 1.  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  Continue CIR  Plan to DC staples ~7/8 2.  Antithrombotics: -DVT/anticoagulation:  Pharmaceutical: Heparin             -antiplatelet therapy: ASA 3. Pain Management:  Hydrocodone prn 4. Mood: Team to provide ego support.    Added Xanax prn anxiety.              -antipsychotic agents: N/A 5. Neuropsych: This patient is capable of making decisions on his own behalf. 6. Skin/Wound Care: Routine pressure relief measures. Monitor wound daily.  7. Fluids/Electrolytes/Nutrition: Monitor I/Os.   8. Lung cancer with bony mets: Follow-up heme/Onc recs. 9. Abdominal pain: Resolved  KUB personally reviewed, showing constipation  See #13 10.  Leucocytosis: Monitor for signs of infection.    WBCs 11.4 on 7/2, labs pending  Continue to monitor 11. Hypotension:  Monitor BP tid. Monitor for orthostatic symptoms  Soft, but asymptomatic on 7/6  Monitor with increased mobility 12. COPD with SOB:  Continue Pulmicort nebs.  13.  Bowels:   MiraLAX twice daily ordered on 7/2  1 time bowel regimen given on 7/2  Improving on 7/3 14.  Hypokalemia  Potassium 4.1 on 7/3, labs pending  Continue to monitor 15.  Hypoalbuminemia  Supplement initiated on 7/2 16.  Acute blood loss anemia  Hemoglobin 10.9 on 7/2, labs pending  Continue to monitor 17.  Sleep disturbance  EKG reviewed  Ambien started on 7/6  LOS: 5 days A FACE TO FACE EVALUATION WAS PERFORMED  Taya Ashbaugh Lorie Phenix 05/01/2019, 9:26 AM

## 2019-05-02 ENCOUNTER — Inpatient Hospital Stay (HOSPITAL_COMMUNITY): Payer: Self-pay | Admitting: Physical Therapy

## 2019-05-02 ENCOUNTER — Encounter (HOSPITAL_COMMUNITY): Payer: Self-pay | Admitting: Radiation Oncology

## 2019-05-02 ENCOUNTER — Inpatient Hospital Stay (HOSPITAL_COMMUNITY): Payer: Self-pay | Admitting: Occupational Therapy

## 2019-05-02 ENCOUNTER — Encounter (HOSPITAL_COMMUNITY): Payer: Self-pay | Admitting: Psychology

## 2019-05-02 DIAGNOSIS — E871 Hypo-osmolality and hyponatremia: Secondary | ICD-10-CM

## 2019-05-02 MED ORDER — BOOST / RESOURCE BREEZE PO LIQD CUSTOM
1.0000 | Freq: Three times a day (TID) | ORAL | Status: DC
  Administered 2019-05-02 – 2019-05-06 (×11): 1 via ORAL

## 2019-05-02 NOTE — Progress Notes (Signed)
Occupational Therapy Session Note  Patient Details  Name: Paul Thornton MRN: [id number] Date of Birth: September 03, 1961  Today's Date: 05/02/2019 OT Individual Time: 1000-1100 OT Individual Time Calculation (min): 60 min    Short Term Goals: Week 1:  OT Short Term Goal 1 (Week 1): patient will complete sit to stand and SPT with RW  CG/CS OT Short Term Goal 2 (Week 1): patient will complete dressing and bathing tasks at S level with good carryover of back precautions OT Short Term Goal 3 (Week 1): patient with complete basic HM with RW in stance with CG/CS Week 2:     Skilled Therapeutic Interventions/Progress Updates:    1:! Engaged in bathing and dressing at sink level . Pt able to bathe and dress with setup an dsupervision for sit to stands. Pt without c/o dizziness with prolonged standing. PT able to tolerate standing at sink for brushing hair and brushing teeth with intermittent UE support. Pt did require some A to don and doff TLSO and to recall no bending over to put things on the floor.  Transitioned to the gym and performed Limits of stability and maze control with and without stable surface without UE support. Pt with difficulty with control/ smoothness of movements. Pt performed functional ambulation from gym to elevators in hallway ~60 feet with RW stepping up and down off 4 inch surface with contact guard. Pt with narrow BOS requiring cuing to widened BOS. Pt able to ambulate another 60 feet to return to w/c. Left resting in the w/c.   Therapy Documentation Precautions:  Precautions Precautions: Back, Fall Precaution Comments: cues for back precautions during self care tasks Required Braces or Orthoses: Spinal Brace Spinal Brace: Thoracolumbosacral orthotic, Applied in sitting position Restrictions Weight Bearing Restrictions: No General:   Vital Signs: Therapy Vitals Temp: 98.7 F (37.1 C) Temp Source: Oral Pulse Rate: 90 Resp: 18 BP: 95/67 Patient Position (if  appropriate): Sitting Oxygen Therapy SpO2: 100 % O2 Device: Room Air Pain:  soreness and fatigue after session    Therapy/Group: Individual Therapy  [facility] 05/02/2019, 3:09 PM

## 2019-05-02 NOTE — Progress Notes (Signed)
[facility] PHYSICAL MEDICINE & REHABILITATION PROGRESS NOTE  Subjective/Complaints: Patient seen sitting up in bed this morning.  He states he slept well overnight.  He notes improvement with medications.  ROS: Denies CP, shortness of breath, nausea, vomiting, diarrhea.  Objective: Vital Signs: Blood pressure 99/61, pulse 86, temperature 98 F (36.7 C), resp. rate 16, height 5\' 9"  (1.753 m), weight 45.5 kg, SpO2 98 %. No results found. Recent Labs    05/01/19 1200  WBC 14.8*  HGB 11.7*  HCT 36.0*  PLT 539*   Recent Labs    05/01/19 1200  NA 130*  K 4.1  CL 93*  CO2 29  GLUCOSE 101*  BUN 17  CREATININE 0.65  CALCIUM 8.8*    Physical Exam: BP 99/61 (BP Location: Left Arm)   Pulse 86   Temp 98 F (36.7 C)   Resp 16   Ht 5\' 9"  (1.753 m)   Wt 45.5 kg   SpO2 98%   BMI 14.80 kg/m  Constitutional: No distress . Vital signs reviewed.  Cachectic. HENT: Normocephalic.  Atraumatic. Eyes: EOMI.  No discharge. Cardiovascular: No JVD. Respiratory: Normal effort. GI: Non-distended. Musc: No edema or tenderness in extremities. Neurological: Alert and oriented. Motor: Bilateral upper extremities: 5/5 proximal distal Right lower extremity: 4/5 proximal and distal (stronger than left), improving Left lower extremity: 4/5 proximal to distal, improving, stable, improving Skin: Thoracic incision with staples C/D/I Psychiatric: He has a normal mood and affect. His behavior is normal.   Assessment/Plan: 1. 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  Physiatrist is providing close team supervision and 24 hour management of active medical problems listed below.  Physiatrist and rehab team continue to assess barriers to discharge/monitor patient progress toward functional and medical goals  Care Tool:  Bathing     Body parts bathed by patient: Right arm, Left arm, Chest, Abdomen, Front perineal area, Right upper leg, Left upper leg, Face, Buttocks, Right lower leg, Left lower leg   Body parts bathed by helper: Buttocks, Right lower leg, Left lower leg     Bathing assist Assist Level: Contact Guard/Touching assist     Upper Body Dressing/Undressing Upper body dressing   What is the patient wearing?: Pull over shirt, Orthosis Orthosis activity level: Performed by patient  Upper body assist Assist Level: Supervision/Verbal cueing    Lower Body Dressing/Undressing Lower body dressing      What is the patient wearing?: Underwear/pull up, Pants     Lower body assist Assist for lower body dressing: Contact Guard/Touching assist     Toileting Toileting    Toileting assist Assist for toileting: Contact Guard/Touching assist     Transfers Chair/bed transfer  Transfers assist     Chair/bed transfer assist level: Minimal Assistance - Patient > 75% Chair/bed transfer assistive device: Programmer, multimedia   Ambulation assist      Assist level: Minimal Assistance - Patient > 75% Assistive device: Walker-rolling Max distance: 30'   Walk 10 feet activity   Assist     Assist level: Minimal Assistance - Patient > 75% Assistive device: Walker-rolling   Walk 50 feet activity   Assist Walk 50 feet with 2 turns activity did not occur: Safety/medical concerns  Assist level: Moderate Assistance - Patient - 50 - 74%      Walk 150 feet activity   Assist Walk  150 feet activity did not occur: Safety/medical concerns         Walk 10 feet on uneven surface  activity   Assist Walk 10 feet on uneven surfaces activity did not occur: Safety/medical concerns         Wheelchair     Assist Will patient use wheelchair at discharge?: Yes Type of Wheelchair: Manual    Wheelchair assist level: Supervision/Verbal cueing Max wheelchair distance: 150'     Wheelchair 50 feet with 2 turns activity    Assist        Assist Level: Supervision/Verbal cueing   Wheelchair 150 feet activity     Assist Wheelchair 150 feet activity did not occur: Safety/medical concerns   Assist Level: Supervision/Verbal cueing      Medical Problem List and Plan: 1.  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  Continue CIR  Plan to DC staples ~7/8 2.  Antithrombotics: -DVT/anticoagulation:  Pharmaceutical: Heparin             -antiplatelet therapy: ASA 3. Pain Management:  Hydrocodone prn 4. Mood: Team to provide ego support.    Added Xanax prn anxiety.              -antipsychotic agents: N/A 5. Neuropsych: This patient is capable of making decisions on his own behalf. 6. Skin/Wound Care: Routine pressure relief measures. Monitor wound daily.  7. Fluids/Electrolytes/Nutrition: Monitor I/Os.   8. Lung cancer with bony mets: Follow-up heme/Onc recs. 9. Abdominal pain: Resolved  KUB personally reviewed, showing constipation  See #13 10.  Leucocytosis: Monitor for signs of infection.    WBCs 14.8 on 7/6, labs ordered for tomorrow  Afebrile  Continue to monitor 11. Hypotension:  Monitor BP tid. Monitor for orthostatic symptoms  Soft, but asymptomatic on 7/7  Monitor with increased mobility 12. COPD with SOB:  Continue Pulmicort nebs.  13.  Bowels:   MiraLAX twice daily ordered on 7/2  1 time bowel regimen given on 7/2  Improving on 7/3 14.  Hypokalemia  Potassium 4.1 on 7/6  Continue to monitor 15.  Hypoalbuminemia  Supplement initiated on 7/2 16.  Acute blood loss anemia  Hemoglobin 11.7 on 7/6, labs ordered for tomorrow  Continue to monitor 17.  Sleep disturbance  EKG reviewed  Ambien started on 7/6  Improving 18.  Hyponatremia  Sodium 130 on 7/6  Continue to monitor  LOS: 6 days A FACE TO FACE EVALUATION  WAS PERFORMED  Keonia Pasko Lorie Phenix 05/02/2019, 8:25 AM

## 2019-05-02 NOTE — Progress Notes (Signed)
Physical Therapy Session Note  Patient Details  Name: Paul Thornton MRN: [id number] Date of Birth: 05-11-1961  Today's Date: 05/02/2019 PT Individual Time: 0805-0900 AND 1300-1355 PT Individual Time Calculation (min): 55 min AND 55 min   Short Term Goals: Week 1:  PT Short Term Goal 1 (Week 1): Pt will perform bed mobility at S level with cues for maintaining precautions <25% of time. PT Short Term Goal 2 (Week 1): Pt will perform sit<>stand at min A level. PT Short Term Goal 3 (Week 1): Pt will perform stand pivot transfers at min A level w/ RW. PT Short Term Goal 4 (Week 1): Pt will ambulate x 100' with RW at min A consistently.  Skilled Therapeutic Interventions/Progress Updates:   Session 1:  Pt in supine and agreeable to therapy, 5/10 pain in mid-back (pre-medicated). Supine>sit w/ supervision and donned shoes w/ set-up assist. Donned TLSO in seated w/ min verbal/tactile cues for technique. Stand pivot w/ CGA. Pt self-propelled w/c around unit w/ supervision using BUEs to work on endurance and UE strengthening. Worked on LE strengthening in therapy gym. Ambulated 18' and 30' w/ CGA using RW, emphasis on minimizing L extension thrust during single limb stance. BLE strengthening exercises as listed below w/ mirror for visual cues of form and verbal cues as well. Tactile cues to prevent L hyperextension when returning to neutral stance. Returned to room and ended session in w/c, all needs in reach.   BLE strengthening:  -partial knee bends 1x10, added orange TB for abduction resistance 2x10 -knee marches 3kg weights 3x10 -seated heel raises 2x20 -orange TB resisted abduction in sitting 3x10 -heel slides w/ orange TB resistance, LLE only, 3x10 -seated adduction squeezes 3x10   Session 2:  Pt in w/c and agreeable to therapy, pain 5/10. Pt self-propelled w/c around unit w/ supervision using BUEs to work on functional endurance and strengthening. Worked on LE strengthening while negotiating  3" steps x8. Performed w/ B rails and progressing to unilateral, both w/ CGA and verbal cues for pattern. Worked on LLE eccentric control w/ R step downs 3x10 reps, tactile and verbal cues to minimize L hyperextension upon returning to step. NuStep 5 min x2 @ level 4 w/ all extremities for global strengthening and endurance training. Education of pt's endurance deficits including in presence of significant weight loss, COPD, and recent spine surgery. Discussed energy conservation techniques for household mobility, benefits of w/c use, and recovery of endurance deficits. Pt very concerned that he has lung cancer and reports no one has discussed the results of his lung biopsy with him, made PA and MD aware that pt is uninformed. Returned to room and ended session in supine, all needs in reach.   Therapy Documentation Precautions:  Precautions Precautions: Back, Fall Precaution Comments: cues for back precautions during self care tasks Required Braces or Orthoses: Spinal Brace Spinal Brace: Thoracolumbosacral orthotic, Applied in sitting position Restrictions Weight Bearing Restrictions: No  Therapy/Group: Individual Therapy  Paul Thornton Paul Thornton Paul Thornton 05/02/2019, 9:15 AM

## 2019-05-02 NOTE — Consult Note (Signed)
Neuropsychological Consultation   Patient:   Paul Thornton   DOB:   [birth year]  MR Number:  [id number]  Location:  [facility] [address] [facility] [place] [id number] [place] [address] Dept: [place]: [phone]           Date of Service:   05/02/2019  Start Time:   2 PM End Time:   3 PM  Provider/Observer:  Ilean Skill, Psy.D.       Clinical Neuropsychologist       Billing Code/Service: [phone]  Chief Complaint:    Paul Thornton is a 58 year old male with history of COPD, tobacco abuse that was admitted via ED on 04/19/19 with 5 day history of wheezing, cough, chest wall pain, bilateral lower extremity weakness.  Work up revealed elevated d-dimer, tachypnea with mild hypoxic and audible wheezing.  CT chest done revealing large RUL neoplasm invading adjacent paratracheal region on right as well as right posterior pleura, right 4th and 5th ribs as well as T4 and T5 vertebral bodies with marked collapse of T4 vertebral body.  MRI brain done showing right frontal infarct.  Small sub centimeter right frontal MCA hemisphere infarct affecting middle and posterior cerebral artery query shower of emboli and premature white matter disease.  Patient underwent laminectomy with decompression of thoracic spine from T3-T5 and vertebrectomies T3-T4 with ilac crest allograft and fixation on 6/26.  Patient recommended for CIR due to functional deficits.    Reason for Service:  Patient was referred for neuropsychological consultation due to coping and adjustment issues with significant new medical issues including metastatic lung mass.  Below is the HPI for the current admission.  HPI:  Paul Thornton is a 58 year old male with history of COPD, tobacco abuse who was admitted via ED on 04/19/2019 with 5-day history of wheezing, cough, chest wall pain, bilateral lower extremity weakness.  History taken from chart review and patient.   He revealed a 24-month history of weight loss.  Work up revealed elevated d-dimer, tachypneia with mild hypoxic and audible wheezing. CT chest done for work up and revealed large 7.2 X 6.2 X 6.9 cm RUL neoplasm invading adjacent paratracheal region on right as well as right posterior pleura, right 4 th and 5th ribs as T4 and T5 vertebral bodies with marked collapse of T4 vertebral body with invasion of T5 elements on the right. Incidental findings of extensive emphysematous changes and hepatic steatosis noted. He was started on doxycycline due to concerns of COPD exacerbation/bronchitis. IR consulted biopsy of right lung mass and thoracic tumor which was positive for adenocarcinoma.   MRI brain done, reviewed, showing right frontal infarct.  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  Neurology consulted for input and recommended full work-up to rule out embolic source.  BLE Dopplers done for work-up and negative for DVT.  TCD negative for HITS.   2D echo with ejection fraction of 55-60%.  Aspirin recommended for secondary stroke prevention and stroke felt to be due to atherosclerosis versus cancer related hypercoagulability.  Dr. Ellene Route consulted for input and patient underwent laminectomy with decompression of thoracic spine from T3-T5 and vertebrectomies T3-T4 with iliac crest allograft and fixation on 6/26.  Postprocedure tolerated extubation without difficulty and has had improvement in BLE strength. Has been treated with IV steroids from  6/26- 6/30.  Nursing swallow evaluation showed no signs of dysphagia dysphagia. Steroids discontinued today and  His case was presented at CNS tumor board on 6/29 and Dr. Mohamed/lung cancer team to follow with input.  Therapy ongoing and patient continues to be limited by bilateral lower extremity weakness with unsteady gait.  CIR recommended due to functional deficits.  Please see preadmission assessment from earlier today as well.  Current Status:  Patient reports that he is still trying to process all the medical issues that have now been identified.  Patient reported he was not aware of CVA but left sided weakness has improved recently.  Patient does not know much information about his Lung cancer and what is the expected plan etc.    Behavioral Observation: Paul Thornton  presents as a 58 y.o.-year-old Right Caucasian Male who appeared his stated age. his dress was Appropriate and he was Well Groomed and his manners were Appropriate to the situation.  his participation was indicative of Appropriate and Attentive behaviors.  There were any physical disabilities noted.  he displayed an appropriate level of cooperation and motivation.     Interactions:    Active Appropriate and Attentive  Attention:   within normal limits and attention span and concentration were age appropriate  Memory:   within normal limits; recent and remote memory intact  Visuo-spatial:  not examined  Speech (Volume):  low  Speech:   normal; normal  Thought Process:  Coherent and Relevant  Though Content:  WNL; not suicidal and not homicidal  Orientation:   person, place and time/date  Judgment:   Fair  Planning:   Poor  Affect:    Anxious  Mood:    Dysphoric  Insight:   Fair  Intelligence:   normal  Medical History:   Past Medical History:  Diagnosis Date  . Bronchitis   . Cancer ([place])   . COPD (chronic obstructive pulmonary disease) ([place])   . GERD (gastroesophageal reflux disease)   . Stroke [facility])      Psychiatric History:  Patient denies past psychiatric history.  Family Med/Psych History:  Family History  Problem Relation Age of Onset  . High blood pressure Mother   . Lung cancer Father     Risk of Suicide/Violence: low patient denied suicidal or homicidal ideation.  Impression/DX:  Paul Thornton is a  58 year old male with history of COPD, tobacco abuse that was admitted via ED on 04/19/19 with 5 day history of wheezing, cough, chest wall pain, bilateral lower extremity weakness.  Work up revealed elevated d-dimer, tachypnea with mild hypoxic and audible wheezing.  CT chest done revealing large RUL neoplasm invading adjacent paratracheal region on right as well as right posterior pleura, right 4th and 5th ribs as well as T4 and T5 vertebral bodies with marked collapse of T4 vertebral body.  MRI brain done showing right frontal infarct.  Small sub centimeter right frontal MCA hemisphere infarct affecting middle and posterior cerebral artery query shower of emboli and premature white matter disease.  Patient underwent laminectomy with decompression of thoracic spine from T3-T5 and vertebrectomies T3-T4 with ilac crest allograft and fixation on 6/26.  Patient recommended for CIR due to functional deficits.    Patient reports that he is still trying to process all the medical issues that have now been identified.  Patient reported he was not aware of CVA but left sided weakness has improved recently.  Patient does not know much information about his Lung cancer  and what is the expected plan etc.  Disposition/Plan:  Will follow up with the patient later this week or first of next week continue to work on coping and adjustment issues in dealing with his significant medical issues and functional loss.  Diagnosis:    Pain - Plan: DG Abd 1 View, DG Abd 1 View         Electronically Signed   _______________________ Ilean Skill, Psy.D.

## 2019-05-03 ENCOUNTER — Inpatient Hospital Stay (HOSPITAL_COMMUNITY): Payer: Self-pay | Admitting: Physical Therapy

## 2019-05-03 ENCOUNTER — Inpatient Hospital Stay (HOSPITAL_COMMUNITY): Payer: Self-pay

## 2019-05-03 ENCOUNTER — Inpatient Hospital Stay (HOSPITAL_COMMUNITY): Payer: Self-pay | Admitting: Occupational Therapy

## 2019-05-03 LAB — CBC
HCT: 33 % — ABNORMAL LOW (ref 39.0–52.0)
Hemoglobin: 10.8 g/dL — ABNORMAL LOW (ref 13.0–17.0)
MCH: 30 pg (ref 26.0–34.0)
MCHC: 32.7 g/dL (ref 30.0–36.0)
MCV: 91.7 fL (ref 80.0–100.0)
Platelets: 520 10*3/uL — ABNORMAL HIGH (ref 150–400)
RBC: 3.6 MIL/uL — ABNORMAL LOW (ref 4.22–5.81)
RDW: 15.5 % (ref 11.5–15.5)
WBC: 9 10*3/uL (ref 4.0–10.5)
nRBC: 0 % (ref 0.0–0.2)

## 2019-05-03 LAB — BASIC METABOLIC PANEL
Anion gap: 9 (ref 5–15)
BUN: 10 mg/dL (ref 6–20)
CO2: 27 mmol/L (ref 22–32)
Calcium: 8.9 mg/dL (ref 8.9–10.3)
Chloride: 96 mmol/L — ABNORMAL LOW (ref 98–111)
Creatinine, Ser: 0.59 mg/dL — ABNORMAL LOW (ref 0.61–1.24)
GFR calc Af Amer: >60 mL/min (ref >60)
GFR calc non Af Amer: >60 mL/min (ref >60)
Glucose, Bld: 106 mg/dL — ABNORMAL HIGH (ref 70–99)
Potassium: 4.2 mmol/L (ref 3.5–5.1)
Sodium: 132 mmol/L — ABNORMAL LOW (ref 135–145)

## 2019-05-03 NOTE — Patient Care Conference (Signed)
Inpatient RehabilitationTeam Conference and Plan of Care Update Date: 05/03/2019   Time: 2:35 PM    Patient Name: Paul Thornton      Medical Record Number: [id number]  Date of Birth: 03/18/1961 Sex: Male         Room/Bed: 4M04C/4M04C-01 Payor Info: Payor: MEDICAID PENDING / Plan: MEDICAID PENDING / Product Type: *No Product type* /    Admitting Diagnosis: 6. ABI Team LT. CVA; 13-15days  Admit Date/Time:  04/26/2019  7:02 PM Admission Comments: No comment available   Primary Diagnosis:  <principal problem not specified> Principal Problem: <principal problem not specified>  Patient Active Problem List   Diagnosis Date Noted  . Hyponatremia   . Sleep disturbance   . Acute cerebral infarction ([place])   . Hypokalemia   . Pain   . Acute blood loss anemia   . Hypoalbuminemia due to protein-calorie malnutrition ([place])   . Drug induced constipation   . Protein-calorie malnutrition, severe 04/26/2019  . 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 Acute exacerbation of chronic obstructive pulmonary disease (COPD) ([place])   . Diarrhea due to malabsorption   . Chronic obstructive pulmonary disease ([place])   . Hypotension   . Leukocytosis   . Primary malignant neoplasm of right lung metastatic to other site [facility])   . Cerebral embolism with cerebral infarction 04/21/2019  . 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 COPD exacerbation ([place]) 04/19/2019  . Tobacco abuse 04/19/2019  . Mass of right lung 04/19/2019  . GERD (gastroesophageal reflux disease) 04/19/2019  . Bronchitis 04/19/2019    Expected Discharge Date: Expected Discharge Date: 05/06/19  Team Members Present: Physician leading conference: Dr. Delice Lesch Social Worker Present: Lennart Pall, LCSW Nurse Present: Judee Clara, LPN PT Present: Burnard Bunting, PT OT Present: Mariane Masters, OT SLP Present: Weston Anna, SLP PPS Coordinator present : Gunnar Fusi,  SLP     Current Status/Progress Goal Weekly Team Focus  Medical   Deficits with mobility, endurance, self-care, balance secondary to right MCA infarct superimposed on lung CA with mets to spine s/p decompression on 04/21/2019.  Improve mobility, electrolytes, hypotension, CA education  See above   Bowel/Bladder   Contine t of Bladder and Bowel LBM 7/7 20 after recieving SSE, continue stool softener/laxatives as ordered  Maintain continence  QS assessment with toilenting and constipation, address Q1-2 days if no redults   Swallow/Nutrition/ Hydration             ADL's   S-min A ADLs/transfers  S-mod I  balance, standing, endurance, back precautions, transfers ADL training   Mobility   close supervision-CGA overall for transfers and gait up to 50' w/ RW, limited by endurance and mild SOB  supervision overall  endurance, global strengthening L>R side, gait and functional balance   Communication             Safety/Cognition/ Behavioral Observations            Pain   C/o pain to surgical back incision site MSIR 15 mg Q4 hrs rate apin 7-8/10 on pain scale  < 3  Assess/  address pain QS/PRN evaluate effectiveness of meds off alternative measures   Skin   Back Incisons intact CDI, no drainage  Prevent infection  Assess QS/PRN surgical wound and monitor for signs of infection/ complication, educate patient/family wound care    Rehab Goals Patient on target to meet rehab goals: Yes *See Care Plan and progress notes for  long and short-term goals.     Barriers to Discharge  Current Status/Progress Possible Resolutions Date Resolved   Physician    Medical stability;Other (comments)  CA  See above  Therapies, follow labs, follow vitals, Heme/Onc recs      Nursing                  PT  Medical stability  metastatic lung cancer              OT                  SLP                SW                Discharge Planning/Teaching Needs:  Pt to d/c home with elderly mother who can provide  supervision only.  TBD.   Team Discussion:  Monitor labs;  Heme-onc may have discussed diagnosis yesterday?  But no documentation.  Trying to manage pain and anxiety.  Close supervision to CGA overall with ambulation ~ 35' but this is max distance due to poor endurance.  Min - supervision with ADLs.    Revisions to Treatment Plan:  NA    Continued Need for Acute Rehabilitation Level of Care: The patient requires daily medical management by a physician with specialized training in physical medicine and rehabilitation for the following conditions: Daily direction of a multidisciplinary physical rehabilitation program to ensure safe treatment while eliciting the highest outcome that is of practical value to the patient.: Yes Daily medical management of patient stability for increased activity during participation in an intensive rehabilitation regime.: Yes Daily analysis of laboratory values and/or radiology reports with any subsequent need for medication adjustment of medical intervention for : Neurological problems;Blood pressure problems;Other   I attest that I was present, lead the team conference, and concur with the assessment and plan of the team.   Donata Clay, Krishav Mamone 05/03/2019, 4:10 PM    Team conference was held via web/ teleconference due to [place] - [phone]

## 2019-05-03 NOTE — Progress Notes (Signed)
Physical Therapy Session Note  Patient Details  Name: Paul Thornton MRN: [id number] Date of Birth: 09/28/1961  Today's Date: 05/03/2019 PT Individual Time: 1100-1200 PT Individual Time Calculation (min): 60 min   Short Term Goals: Week 1:  PT Short Term Goal 1 (Week 1): Pt will perform bed mobility at S level with cues for maintaining precautions <25% of time. PT Short Term Goal 2 (Week 1): Pt will perform sit<>stand at min A level. PT Short Term Goal 3 (Week 1): Pt will perform stand pivot transfers at min A level w/ RW. PT Short Term Goal 4 (Week 1): Pt will ambulate x 100' with RW at min A consistently.  Skilled Therapeutic Interventions/Progress Updates:   Pt in w/c and agreeable to therapy, reports pain 4/10. Pt self-propelled w/c to/from therapy gym w/ BUEs for UE strengthening. NuStep 5 min and 4 min @ level 5 for global endurance and strengthening. Increased work of breathing on NuStep, 4/10 on modified RPE scale, SpO2 on RA down to 87% at end of NuStep. Returned to >90% within 10-15 sec. Discussed using RPE scale to judge if something is too much for him. Discussed readiness for d/c, pt states he feels he is close to being ready to go home where he will received 24/7 supervision/assist from sister and mom. Discussed home health therapies and performing HEP to continue building LE strength and endurance. Also discussed using w/c for energy conservation. Pt verbalized understanding and in agreement. Worked on ambulating in household environment by weaving around cones x4 cones, x3 reps. Supervision and verbal cues for gait pattern and to minimize scissoring when turning w/ RW. Monitored SpO2 during 2nd and 3rd reps while on RA, SpO2 remained >96% and HR 97-110 bpm. Ambulated back towards room 75, 25', and 40' w/ supervision. Encouraged pt to recognize when he needs to sit prior to feeling as if both LEs will buckle. Pt able to safely let therapist know when he needed to sit and took seated  rest breaks. Returned to room and performed toilet transfer w/ close supervision. Pt w/ continent void. Ended session in w/c, all needs in reach.   Therapy Documentation Precautions:  Precautions Precautions: Back, Fall Precaution Comments: cues for back precautions during self care tasks Required Braces or Orthoses: Spinal Brace Spinal Brace: Thoracolumbosacral orthotic, Applied in sitting position Restrictions Weight Bearing Restrictions: No  Therapy/Group: Individual Therapy  Torsten Weniger K Tamel Abel 05/03/2019, 12:02 PM

## 2019-05-03 NOTE — Progress Notes (Signed)
SSE administered and tolerated 1000 cc with fair results( mostly brownish liquids ) content, states he feels better

## 2019-05-03 NOTE — Progress Notes (Signed)
Occupational Therapy Session Note  Patient Details  Name: Paul Thornton MRN: [id number] Date of Birth: [birth year]  Today's Date: 05/03/2019 OT Individual Time: 9532-0233 OT Individual Time Calculation (min): 58 min    Short Term Goals: Week 1:  OT Short Term Goal 1 (Week 1): patient will complete sit to stand and SPT with RW  CG/CS OT Short Term Goal 2 (Week 1): patient will complete dressing and bathing tasks at S level with good carryover of back precautions OT Short Term Goal 3 (Week 1): patient with complete basic HM with RW in stance with CG/CS  Skilled Therapeutic Interventions/Progress Updates:    1;1. Pt completes supine>sitting EOB with S. Pt with no pain as pt received medication. Pt dons brace EOB and ambulates with MIN A overall to stand over toilet to void urine. Pt completes bathing and dressing at sink sit to stand with supervision, crossing into figure 4 to wash LEs. Pt dresses with supervision with VC for completing LB bathing/dressing prior to UB to eliminate doffing donning TLSO multiple times. Pt stands to groom combing hair and brushing teeth. Pt dons shoes with set up. Pt completes w/c mobility with supervision to/from all tx destinations. Pt completes standing wii balance board activity with 1 LOB forward with MOD A to recover without use of RW. Exited session with pt seated in w/c exitalarm on and call light in reach  Therapy Documentation Precautions:  Precautions Precautions: Back, Fall Precaution Comments: cues for back precautions during self care tasks Required Braces or Orthoses: Spinal Brace Spinal Brace: Thoracolumbosacral orthotic, Applied in sitting position Restrictions Weight Bearing Restrictions: No General:   Vital Signs: Therapy Vitals Pulse Rate: 82 Resp: 18 Patient Position (if appropriate): Lying Oxygen Therapy O2 Device: Room Air FiO2 (%): 99 % Pain:   ADL: ADL Eating: Independent Where Assessed-Eating: Bed level Grooming:  Setup Where Assessed-Grooming: Sitting at sink Upper Body Bathing: Setup Where Assessed-Upper Body Bathing: Sitting at sink Lower Body Bathing: Moderate assistance Where Assessed-Lower Body Bathing: Sitting at sink Upper Body Dressing: Setup Where Assessed-Upper Body Dressing: Sitting at sink Lower Body Dressing: Moderate assistance Where Assessed-Lower Body Dressing: Wheelchair Toileting: Moderate assistance Where Assessed-Toileting: Glass blower/designer: Moderate assistance Toilet Transfer Method: Stand pivot Toilet Transfer Equipment: Grab bars ADL Comments: not cleared to shower at this time Vision   Perception    Praxis   Exercises:   Other Treatments:     Therapy/Group: Individual Therapy  Tonny Branch 05/03/2019, 9:14 AM

## 2019-05-03 NOTE — Progress Notes (Signed)
Nutrition Follow-up  DOCUMENTATION CODES:   Severe malnutrition in context of chronic illness, Underweight  INTERVENTION:   - Continue Boost Breeze po TID, each supplement provides 250 kcal and 9 grams of protein  - Continue snacks TID between meals, RD ordered via [employer] diet software  - Continue Pro-stat 30 ml BID, each supplement provides 100 kcal and 15 grams of protein  NUTRITION DIAGNOSIS:   Severe Malnutrition related to chronic illness (lung cancer with mets to spine s/p decompression) as evidenced by severe fat depletion, severe muscle depletion, percent weight loss (12.2% weight loss in less than 1 month).  Ongoing, being addressed via oral nutrition supplements and snacks  GOAL:   Patient will meet greater than or equal to 90% of their needs  Progressing  MONITOR:   PO intake, Supplement acceptance, Labs, Weight trends, I & O's, Skin  REASON FOR ASSESSMENT:   Malnutrition Screening Tool    ASSESSMENT:   58 year old male with PMH of COPD, tobacco abuse who was admitted on 04/19/19 with wheezing, cough, chest wall pain, and bilateral lower extremity weakness. CT chest done for work up and revealed large RUL neoplasm invading adjacent paratracheal region on right as well as right posterior pleura, right 4th and 5th ribs as T4 and T5 vertebral bodies with marked collapse of T4 vertebral body with invasion of T5 elements on the right. IR completed biopsy of right lung mass and thoracic tumor which was positive for adenocarcinoma. MRI brain done, reviewed, showing right frontal infarct. Pt underwent laminectomy with decompression of thoracic spine from T3-T5 and vertebrectomies T3-T4 with iliac crest allograft and fixation on 6/26. Pt admitted to CIR on 7/01.  Weight up a total of 7 lbs since admission.  Spoke with pt at bedside. Pt in good spirits. Pt states his appetite is good and he is "eating as much as I can." Pt reports liking Boost Breeze more than Ensure  Enlive. Order has been changed to reflect this.  Pt states he is receiving snacks and is eating them. Pt denies any nutrition-related concerns at this time.  Meal Completion: 65-100% x last 8 meals  Medications reviewed and include: Pepcid, Boost Breeze TID, Pro-stat 30 ml BID  Labs reviewed: sodium 132, chloride 96  UOP: 1425 ml x 24 hours I/O's: -9.0 L since admit  Diet Order:   Diet Order            Diet regular Room service appropriate? Yes; Fluid consistency: Thin  Diet effective now              EDUCATION NEEDS:   Education needs have been addressed  Skin:  Skin Assessment: Skin Integrity Issues: Skin Integrity Issues:: Incisions Incisions: closed incision to back  Last BM:  05/03/19 type 7  Height:   Ht Readings from Last 1 Encounters:  04/26/19 5\' 9"  (1.753 m)    Weight:   Wt Readings from Last 1 Encounters:  05/03/19 44 kg    Ideal Body Weight:  73 kg  BMI:  Body mass index is 14.32 kg/m.  Estimated Nutritional Needs:   Kcal:  1650-1850  Protein:  80-90 grams  Fluid:  >/= 1.6 L    Gaynell Face, MS, RD, LDN Inpatient Clinical Dietitian Pager: [phone] Weekend/After Hours: [phone]

## 2019-05-03 NOTE — Progress Notes (Signed)
Occupational Therapy Session Note  Patient Details  Name: Paul Thornton MRN: [id number] Date of Birth: 03/24/1961  Today's Date: 05/03/2019 OT Individual Time: 1400-1435 OT Individual Time Calculation (min): 35 min    Short Term Goals: Week 1:  OT Short Term Goal 1 (Week 1): patient will complete sit to stand and SPT with RW  CG/CS OT Short Term Goal 2 (Week 1): patient will complete dressing and bathing tasks at S level with good carryover of back precautions OT Short Term Goal 3 (Week 1): patient with complete basic HM with RW in stance with CG/CS  Skilled Therapeutic Interventions/Progress Updates:    1:1 Engaged in showering today with staples covered and remained in a seated position. Pt reported staples were going to be removed today. Pt ambulated to the bathroom with RW with extra time with contact guard and transitioned into shower stall with contact guard onto tub bench. Pt able to bathe mod I with using the grab bar for support when leaning to wash. Pt bathed periarea and buttocks with lateral leans. Pt able to don shirt and TLSO with setup. Ambulated to bed to dress LB with setup. Returned to supine to rest at end of session without bed rail with out requiring cues for precautions.   Therapy Documentation Precautions:  Precautions Precautions: Back, Fall Precaution Comments: cues for back precautions during self care tasks Required Braces or Orthoses: Spinal Brace Spinal Brace: Thoracolumbosacral orthotic, Applied in sitting position Restrictions Weight Bearing Restrictions: No General:   Vital Signs: Therapy Vitals Temp: (!) 97.5 F (36.4 C) Pulse Rate: 96 Resp: 18 BP: 94/74 Patient Position (if appropriate): Sitting Oxygen Therapy SpO2: 97 % O2 Device: Room Air Pain:  bottom hurts/ uncomfortable from sitting - allowed for position change and then supine at end of session   Therapy/Group: Individual Therapy  Willeen Cass [facility] 05/03/2019, 4:12 PM

## 2019-05-03 NOTE — Progress Notes (Signed)
PHYSICAL MEDICINE & REHABILITATION PROGRESS NOTE  Subjective/Complaints: Patient seen sitting up in bed this morning.  He states he slept well overnight.  Discussed plan for Heme/Onc follow up.   ROS: Denies CP, shortness of breath, nausea, vomiting, diarrhea.  Objective: Vital Signs: Blood pressure 98/62, pulse 82, temperature 98.9 F (37.2 C), temperature source Oral, resp. rate 18, height 5\' 9"  (1.753 m), weight 44 kg, SpO2 98 %. No results found. Recent Labs    05/01/19 1200 05/03/19 0702  WBC 14.8* 9.0  HGB 11.7* 10.8*  HCT 36.0* 33.0*  PLT 539* 520*   Recent Labs    05/01/19 1200 05/03/19 0702  NA 130* 132*  K 4.1 4.2  CL 93* 96*  CO2 29 27  GLUCOSE 101* 106*  BUN 17 10  CREATININE 0.65 0.59*  CALCIUM 8.8* 8.9    Physical Exam: BP 98/62 (BP Location: Left Arm)   Pulse 82   Temp 98.9 F (37.2 C) (Oral)   Resp 18   Ht 5\' 9"  (1.753 m)   Wt 44 kg   SpO2 98%   BMI 14.32 kg/m  Constitutional: No distress . Vital signs reviewed.  Cachectic. HENT: Normocephalic.  Atraumatic. Eyes: EOMI.  No discharge. Cardiovascular: No JVD. Respiratory: Normal effort. GI: Non-distended. Musc: No edema or tenderness in extremities. Neurological: Alert and oriented. Motor: Bilateral upper extremities: 5/5 proximal distal Right lower extremity: 4/5 proximal and distal (stronger than left), improving Left lower extremity: 4/5 proximal to distal, improving, stable, improving Skin: Thoracic incision with staples C/D/I Psychiatric: He has a normal mood and affect. His behavior is normal.   Assessment/Plan: 1. 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  Physiatrist is providing close team supervision and 24 hour management of active medical problems listed below.  Physiatrist and rehab team continue to  assess barriers to discharge/monitor patient progress toward functional and medical goals  Care Tool:  Bathing    Body parts bathed by patient: Right arm, Left arm, Chest, Abdomen, Front perineal area, Right upper leg, Left upper leg, Face, Buttocks, Right lower leg, Left lower leg   Body parts bathed by helper: Buttocks, Right lower leg, Left lower leg     Bathing assist Assist Level: Set up assist     Upper Body Dressing/Undressing Upper body dressing   What is the patient wearing?: Pull over shirt, Orthosis Orthosis activity level: Performed by helper  Upper body assist Assist Level: Set up assist    Lower Body Dressing/Undressing Lower body dressing      What is the patient wearing?: Pants     Lower body assist Assist for lower body dressing: Set up assist     Toileting Toileting    Toileting assist Assist for toileting: Contact Guard/Touching assist     Transfers Chair/bed transfer  Transfers assist     Chair/bed transfer assist level: Minimal Assistance - Patient > 75% Chair/bed transfer assistive device: Programmer, multimedia   Ambulation assist      Assist level: Contact Guard/Touching assist Assistive device: Walker-rolling Max distance: 40'   Walk 10 feet activity   Assist     Assist level: Contact Guard/Touching assist Assistive device: Walker-rolling   Walk 50 feet activity   Assist Walk 50 feet with 2 turns activity did not occur: Safety/medical concerns  Assist level: Moderate Assistance - Patient - 39 -  74%      Walk 150 feet activity   Assist Walk 150 feet activity did not occur: Safety/medical concerns         Walk 10 feet on uneven surface  activity   Assist Walk 10 feet on uneven surfaces activity did not occur: Safety/medical concerns         Wheelchair     Assist Will patient use wheelchair at discharge?: Yes Type of Wheelchair: Manual    Wheelchair assist level: Supervision/Verbal  cueing Max wheelchair distance: 150'    Wheelchair 50 feet with 2 turns activity    Assist        Assist Level: Supervision/Verbal cueing   Wheelchair 150 feet activity     Assist Wheelchair 150 feet activity did not occur: Safety/medical concerns   Assist Level: Supervision/Verbal cueing      Medical Problem List and Plan: 1.  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  Continue CIR  DC staples  Team conference today to discuss current and goals and coordination of care, home and environmental barriers, and discharge planning with nursing, case manager, and therapies.   Discussed with Heme/Onc - plan for follow up 2.  Antithrombotics: -DVT/anticoagulation:  Pharmaceutical: Heparin             -antiplatelet therapy: ASA 3. Pain Management:  Hydrocodone prn 4. Mood: Team to provide ego support.    Added Xanax prn anxiety.              -antipsychotic agents: N/A 5. Neuropsych: This patient is capable of making decisions on his own behalf.  Discussed with neuropsych- appreciate recs 6. Skin/Wound Care: Routine pressure relief measures. Monitor wound daily.  7. Fluids/Electrolytes/Nutrition: Monitor I/Os.   8. Lung cancer with bony mets: Follow-up heme/Onc recs. 9. Abdominal pain: Resolved  KUB personally reviewed, showing constipation  See #13 10.  Leucocytosis: Resolved   Monitor for signs of infection.    WBCs 9.0 on 7/8  Afebrile  Continue to monitor 11. Hypotension:  Monitor BP tid. Monitor for orthostatic symptoms  Soft, but asymptomatic on 7/8  Monitor with increased mobility 12. COPD with SOB:  Continue Pulmicort nebs.  13.  Bowels:   MiraLAX twice daily ordered on 7/2  1 time bowel regimen given on 7/2  Improving  14.  Hypokalemia: Resolved  Potassium 4.2 on 7/8  Continue to monitor 15.  Hypoalbuminemia  Supplement initiated on  7/2 16.  Acute blood loss anemia  Hemoglobin 10.8 on 7/8  Continue to monitor 17.  Sleep disturbance  EKG reviewed  Ambien started on 7/6  Improving 18.  Hyponatremia  Sodium 132 on 7/8  Continue to monitor  LOS: 7 days A FACE TO FACE EVALUATION WAS PERFORMED   Lorie Phenix 05/03/2019, 8:42 AM

## 2019-05-04 ENCOUNTER — Inpatient Hospital Stay (HOSPITAL_COMMUNITY): Payer: Self-pay

## 2019-05-04 ENCOUNTER — Inpatient Hospital Stay (HOSPITAL_COMMUNITY): Payer: Self-pay | Admitting: Physical Therapy

## 2019-05-04 ENCOUNTER — Inpatient Hospital Stay (HOSPITAL_COMMUNITY): Payer: Self-pay | Admitting: Occupational Therapy

## 2019-05-04 DIAGNOSIS — G8918 Other acute postprocedural pain: Secondary | ICD-10-CM

## 2019-05-04 MED ORDER — HYDROCODONE-ACETAMINOPHEN 7.5-325 MG/15ML PO SOLN
10.0000 mL | ORAL | Status: DC | PRN
  Administered 2019-05-05 (×2): 10 mL via ORAL
  Filled 2019-05-04 (×2): qty 15

## 2019-05-04 NOTE — Progress Notes (Signed)
  Patient ID: Paul Thornton, male   DOB: [birth year], 58 y.o.   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  A walker will not resolve issue with performing activities of daily living.  A wheelchair will allow patient to safely perform daily activities.  Patient is not able to propel themselves in the home using a standard weight wheelchair due to significant weakness.  Patient can self propel in the lightweight wheelchair.   Reesa Chew, PA-C

## 2019-05-04 NOTE — Progress Notes (Signed)
Social Work Patient ID: Paul Thornton, male   DOB: 1961-03-04, 58 y.o.   MRN: [id number]  Pt aware and agreeable with targeted d/c date of 7/11 and supervision goals overall.  Reviewed DME and [facility] referrals being made.  Continue to follow.  Arling Cerone, LCSW

## 2019-05-04 NOTE — Progress Notes (Signed)
[facility] PHYSICAL MEDICINE & REHABILITATION PROGRESS NOTE  Subjective/Complaints: Patient seen sitting up in bed this morning.  He states he slept well overnight.  He notes improvement in strength.  He states he has not heard from Heme/Onc.  ROS: Denies CP, shortness of breath, nausea, vomiting, diarrhea.  Objective: Vital Signs: Blood pressure (!) 89/59, pulse 78, temperature 98.4 F (36.9 C), temperature source Oral, resp. rate 16, height 5\' 9"  (1.753 m), weight 41.3 kg, SpO2 94 %. No results found. Recent Labs    05/03/19 0702  WBC 9.0  HGB 10.8*  HCT 33.0*  PLT 520*   Recent Labs    05/03/19 0702  NA 132*  K 4.2  CL 96*  CO2 27  GLUCOSE 106*  BUN 10  CREATININE 0.59*  CALCIUM 8.9    Physical Exam: BP (!) 89/59 (BP Location: Left Arm)   Pulse 78   Temp 98.4 F (36.9 C) (Oral)   Resp 16   Ht 5\' 9"  (1.753 m)   Wt 41.3 kg   SpO2 94%   BMI 13.45 kg/m  Constitutional: No distress . Vital signs reviewed.  Cachectic. HENT: Normocephalic.  Atraumatic. Eyes: EOMI.  No discharge. Cardiovascular: No JVD. Respiratory: Normal effort. GI: Non-distended. Musc: No edema or tenderness in extremities. Neurological: Alert and oriented. Motor: Bilateral upper extremities: 5/5 proximal distal Right lower extremity: 4/5 proximal and distal (stronger than left), improving Left lower extremity: 4/5 proximal to distal, improving, stable, improving Skin: Thoracic incision C/D/I Psychiatric: He has a normal mood and affect. His behavior is normal.   Assessment/Plan: 1. 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  Physiatrist is providing close team supervision and 24 hour management of active medical problems listed below.  Physiatrist and rehab team continue to assess barriers to discharge/monitor patient progress  toward functional and medical goals  Care Tool:  Bathing    Body parts bathed by patient: Right arm, Left arm, Chest, Abdomen, Front perineal area, Right upper leg, Left upper leg, Face, Buttocks, Right lower leg, Left lower leg   Body parts bathed by helper: Buttocks, Right lower leg, Left lower leg     Bathing assist Assist Level: Independent with assistive device Assistive Device Comment: grab bar   Upper Body Dressing/Undressing Upper body dressing   What is the patient wearing?: Orthosis, Pull over shirt Orthosis activity level: Performed by patient  Upper body assist Assist Level: Set up assist    Lower Body Dressing/Undressing Lower body dressing      What is the patient wearing?: Pants     Lower body assist Assist for lower body dressing: Set up assist     Toileting Toileting    Toileting assist Assist for toileting: Contact Guard/Touching assist     Transfers Chair/bed transfer  Transfers assist     Chair/bed transfer assist level: Supervision/Verbal cueing Chair/bed transfer assistive device: Programmer, multimedia   Ambulation assist      Assist level: Supervision/Verbal cueing Assistive device: Walker-rolling Max distance: 75'   Walk 10 feet activity   Assist     Assist level: Supervision/Verbal cueing Assistive device: Walker-rolling   Walk 50 feet activity   Assist Walk 50 feet with 2 turns activity did not occur: Safety/medical concerns  Assist level: Supervision/Verbal cueing Assistive device: Walker-rolling    Walk 150 feet activity   Assist Walk 150  feet activity did not occur: Safety/medical concerns         Walk 10 feet on uneven surface  activity   Assist Walk 10 feet on uneven surfaces activity did not occur: Safety/medical concerns         Wheelchair     Assist Will patient use wheelchair at discharge?: Yes Type of Wheelchair: Manual    Wheelchair assist level: Supervision/Verbal  cueing Max wheelchair distance: 150'    Wheelchair 50 feet with 2 turns activity    Assist        Assist Level: Supervision/Verbal cueing   Wheelchair 150 feet activity     Assist Wheelchair 150 feet activity did not occur: Safety/medical concerns   Assist Level: Supervision/Verbal cueing      Medical Problem List and Plan: 1.  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  Continue CIR 2.  Antithrombotics: -DVT/anticoagulation:  Pharmaceutical: Heparin             -antiplatelet therapy: ASA 3. Pain Management:  Hydrocodone prn  Controlled on 7/9 4. Mood: Team to provide ego support.    Added Xanax prn anxiety.              -antipsychotic agents: N/A 5. Neuropsych: This patient is capable of making decisions on his own behalf.  Discussed with neuropsych- appreciate recs 6. Skin/Wound Care: Routine pressure relief measures. Monitor wound daily.  7. Fluids/Electrolytes/Nutrition: Monitor I/Os.   8. Lung cancer with bony mets: Follow-up heme/Onc recs.  Patient awaits Heme/Onc follow up 9. Abdominal pain: Resolved  KUB personally reviewed, showing constipation  See #13 10.  Leucocytosis: Resolved   Monitor for signs of infection.    WBCs 9.0 on 7/8  Afebrile  Continue to monitor 11. Hypotension:  Monitor BP tid. Monitor for orthostatic symptoms  Soft, but asymptomatic on 7/9  Monitor with increased mobility 12. COPD with SOB:  Continue Pulmicort nebs.  13.  Bowels:   MiraLAX twice daily ordered on 7/2  1 time bowel regimen given on 7/2  Improving 14.  Hypokalemia: Resolved  Potassium 4.2 on 7/8  Continue to monitor 15.  Hypoalbuminemia  Supplement initiated on 7/2 16.  Acute blood loss anemia  Hemoglobin 10.8 on 7/8  Continue to monitor 17.  Sleep disturbance  EKG reviewed  Ambien started on 7/6  Improving 18.  Hyponatremia  Sodium 132  on 7/8  Continue to monitor  LOS: 8 days A FACE TO FACE EVALUATION WAS PERFORMED   Lorie Phenix 05/04/2019, 1:14 PM

## 2019-05-04 NOTE — Progress Notes (Signed)
Physical Therapy Session Note  Patient Details  Name: Paul Thornton MRN: [id number] Date of Birth: July 14, 1961  Today's Date: 05/04/2019 PT Individual Time: 1300-1345 PT Individual Time Calculation (min): 45 min   Short Term Goals: Week 1:  PT Short Term Goal 1 (Week 1): Pt will perform bed mobility at S level with cues for maintaining precautions <25% of time. PT Short Term Goal 2 (Week 1): Pt will perform sit<>stand at min A level. PT Short Term Goal 3 (Week 1): Pt will perform stand pivot transfers at min A level w/ RW. PT Short Term Goal 4 (Week 1): Pt will ambulate x 100' with RW at min A consistently.  Skilled Therapeutic Interventions/Progress Updates:   Pt in w/c and agreeable to therapy, pain as detailed below. Pt self-propelled w/c around unit w/ supervision using BUEs to work on endurance. Worked on standing balance on both firm and foam surfaces w/o UE support, emphasis on ankle and hip balance strategies during light UE reaching tasks. Discussed reaching tasks that would require too much bending in light of his back precautions. CGA-min assist for balance. Ambulated from therapy gym to day room, 190' w/ RW and supervision. Mild increase in work of breathing, 7/10 in modified RPE. Overall, decreased work of breathing w/ all activity this session compared to previous sessions. 3 min x3 @ level 5 on NuStep for LE strengthening and endurance training. Educated on energy conservation strategies for household mobility as well as using RPE scale to monitor fatigue level. Pt verbalized understanding and in agreement. Returned to room, ended session in supine and all needs in reach.   Therapy Documentation Precautions:  Precautions Precautions: Back, Fall Precaution Comments: cues for back precautions during self care tasks Required Braces or Orthoses: Spinal Brace Spinal Brace: Thoracolumbosacral orthotic, Applied in sitting position Restrictions Weight Bearing Restrictions: No Pain:    Therapy/Group: Individual Therapy  Franchelle Foskett K Michelina Mexicano 05/04/2019, 1:48 PM

## 2019-05-04 NOTE — Progress Notes (Signed)
Occupational Therapy Session Note  Patient Details  Name: Paul Thornton MRN: [id number] Date of Birth: 20-Oct-1961  Today's Date: 05/04/2019 OT Individual Time: 0930-1030 OT Individual Time Calculation (min): 60 min    Short Term Goals: Week 1:  OT Short Term Goal 1 (Week 1): patient will complete sit to stand and SPT with RW  CG/CS OT Short Term Goal 2 (Week 1): patient will complete dressing and bathing tasks at S level with good carryover of back precautions OT Short Term Goal 3 (Week 1): patient with complete basic HM with RW in stance with CG/CS  Skilled Therapeutic Interventions/Progress Updates:    1:1 focus on dressing at EOB. Pt able to doff clothes and don clean clothes with setup sit to stand and maintaining back precautions. Ambulated to sink with contact guard with RW to brush hair and teeth.   Transition to the gym and transferred on to mat and into supine position. Focus on core stabilization, bilateral LE coordination, overall strengthening. Performed reciprocal bicycling in supine  With LEs in air, straight LE raises and lowers with LEs not touching the mat.   Toe tapping on a 3 inch step without UE support alternating LEs, transitioning to climbing stairs and descending forwards and backwards with focus on control.  Ambulation in the room with RW with close supervision; voiding standing over the commode with supervision   2nd session 1:1 focus on sit to stands without UE support, lateral stepping in a squatted position down the hallway, scapular stabilization exercisers with boom whacker, functional ambulation without AD with min to mod A with focus on hip and knee control.   Therapy Documentation Precautions:  Precautions Precautions: Back, Fall Precaution Comments: cues for back precautions during self care tasks Required Braces or Orthoses: Spinal Brace Spinal Brace: Thoracolumbosacral orthotic, Applied in sitting position Restrictions Weight Bearing Restrictions:  No Pain: No reports of pain in first session  Pt reports soreness in upper back at end of second session  Therapy/Group: Individual Therapy  Willeen Cass [facility] 05/04/2019, 12:42 PM

## 2019-05-04 NOTE — Progress Notes (Signed)
Occupational Therapy Session Note  Patient Details  Name: KELDAN EPLIN MRN: [id number] Date of Birth: Aug 15, 1961  Today's Date: 05/04/2019 OT Individual Time: 1994-1290 OT Individual Time Calculation (min): 48 min    Short Term Goals: Week 1:  OT Short Term Goal 1 (Week 1): patient will complete sit to stand and SPT with RW  CG/CS OT Short Term Goal 2 (Week 1): patient will complete dressing and bathing tasks at S level with good carryover of back precautions OT Short Term Goal 3 (Week 1): patient with complete basic HM with RW in stance with CG/CS  Skilled Therapeutic Interventions/Progress Updates:    Session focused on BUE strengthening and standing endurance/balance. Pt was received supine with no c/o pain. Pt transferred to w/c with (S). Pt propelled w/c with mod I to therapy gym. Pt completed stand pivot transfer to mat requiring cueing for removing leg rests. Pt sat EOM and completed multiple sit <> stands with no UE support with (S)- CGA. Fatigue setting in and pt reporting more balance deficits than usual. Pt completed standing level BUE strengthening circuit holding a 2 # dowel. Pt then transitioned to supine, doffing back brace. Pt completed chest press and tricep extension in supine to increase BUE while giving his core a break. Pt returned to sitting EOM and donned TLSO with (S). Pt completed dynamic standing balance task with brief single leg stance. Pt returned to his w/c and propelled back to his room. Pt was left supine with all needs met.   Therapy Documentation Precautions:  Precautions Precautions: Back, Fall Precaution Comments: cues for back precautions during self care tasks Required Braces or Orthoses: Spinal Brace Spinal Brace: Thoracolumbosacral orthotic, Applied in sitting position Restrictions Weight Bearing Restrictions: No   Therapy/Group: Individual Therapy  Curtis Sites 05/04/2019, 3:04 PM

## 2019-05-05 ENCOUNTER — Inpatient Hospital Stay (HOSPITAL_COMMUNITY): Payer: Self-pay | Admitting: Physical Therapy

## 2019-05-05 ENCOUNTER — Inpatient Hospital Stay (HOSPITAL_COMMUNITY): Payer: Self-pay | Admitting: Occupational Therapy

## 2019-05-05 MED ORDER — HYDROCODONE-ACETAMINOPHEN 7.5-325 MG PO TABS
1.0000 | ORAL_TABLET | ORAL | Status: DC | PRN
  Administered 2019-05-05 – 2019-05-06 (×4): 1 via ORAL
  Filled 2019-05-05 (×4): qty 1

## 2019-05-05 MED ORDER — ALBUTEROL SULFATE HFA 108 (90 BASE) MCG/ACT IN AERS: 1.0000 | INHALATION_SPRAY | Freq: Four times a day (QID) | RESPIRATORY_TRACT | 1 refills | Status: AC | PRN

## 2019-05-05 MED ORDER — HYDROCODONE-ACETAMINOPHEN 7.5-325 MG PO TABS: 1.0000 | ORAL_TABLET | ORAL | 0 refills | Status: AC | PRN

## 2019-05-05 MED ORDER — ALPRAZOLAM 0.25 MG PO TABS: 0.2500 mg | ORAL_TABLET | Freq: Three times a day (TID) | ORAL | 0 refills | Status: AC | PRN

## 2019-05-05 MED ORDER — FAMOTIDINE 20 MG PO TABS: 20.0000 mg | ORAL_TABLET | Freq: Two times a day (BID) | ORAL | 1 refills | Status: AC

## 2019-05-05 MED ORDER — ZOLPIDEM TARTRATE 5 MG PO TABS: 5.0000 mg | ORAL_TABLET | Freq: Every evening | ORAL | 0 refills | Status: AC | PRN

## 2019-05-05 MED ORDER — POLYVINYL ALCOHOL 1.4 % OP SOLN: 1.0000 [drp] | OPHTHALMIC | 0 refills | Status: AC | PRN

## 2019-05-05 MED ORDER — BUDESONIDE 180 MCG/ACT IN AEPB: 2.0000 | INHALATION_SPRAY | Freq: Two times a day (BID) | RESPIRATORY_TRACT | 1 refills | Status: DC

## 2019-05-05 MED ORDER — BUDESONIDE 90 MCG/ACT IN AEPB: 2.0000 | INHALATION_SPRAY | Freq: Two times a day (BID) | RESPIRATORY_TRACT | 0 refills | Status: AC

## 2019-05-05 MED ORDER — NICOTINE 14 MG/24HR TD PT24: 14.0000 mg | MEDICATED_PATCH | Freq: Every day | TRANSDERMAL | 0 refills | Status: AC

## 2019-05-05 NOTE — Progress Notes (Signed)
[facility] PHYSICAL MEDICINE & REHABILITATION PROGRESS NOTE  Subjective/Complaints: Patient seen sitting up in his chair working with therapy this morning.  He states he slept well overnight.  He is looking forward to discharge tomorrow.  ROS: Denies CP, shortness of breath, nausea, vomiting, diarrhea.  Objective: Vital Signs: Blood pressure (!) 90/58, pulse 83, temperature 97.8 F (36.6 C), temperature source Oral, resp. rate 16, height 5\' 9"  (1.753 m), weight 41.3 kg, SpO2 99 %. No results found. Recent Labs    05/03/19 0702  WBC 9.0  HGB 10.8*  HCT 33.0*  PLT 520*   Recent Labs    05/03/19 0702  NA 132*  K 4.2  CL 96*  CO2 27  GLUCOSE 106*  BUN 10  CREATININE 0.59*  CALCIUM 8.9    Physical Exam: BP (!) 90/58 (BP Location: Left Arm)   Pulse 83   Temp 97.8 F (36.6 C) (Oral)   Resp 16   Ht 5\' 9"  (1.753 m)   Wt 41.3 kg   SpO2 99%   BMI 13.45 kg/m  Constitutional: No distress . Vital signs reviewed.  Cachectic. HENT: Normocephalic.  Atraumatic. Eyes: EOMI.  No discharge. Cardiovascular: No JVD. Respiratory: Normal effort. GI: Non-distended. Musc: No edema or tenderness in extremities. Neurological: Alert and oriented. Motor: Bilateral upper extremities: 5/5 proximal distal Right lower extremity: 4/5 proximal and distal (stronger than left), improving Left lower extremity: 4/5 proximal to distal, improving, stable, improving Skin: Thoracic incision C/D/I Psychiatric: He has a normal mood and affect. His behavior is normal.   Assessment/Plan: 1. 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  Physiatrist is providing close team supervision and 24 hour management of active medical problems listed below.  Physiatrist and rehab team continue to assess barriers to discharge/monitor patient progress toward  functional and medical goals  Care Tool:  Bathing    Body parts bathed by patient: Right arm, Left arm, Chest, Abdomen, Front perineal area, Right upper leg, Left upper leg, Face, Buttocks, Right lower leg, Left lower leg   Body parts bathed by helper: Buttocks, Right lower leg, Left lower leg     Bathing assist Assist Level: Independent with assistive device Assistive Device Comment: grab bar   Upper Body Dressing/Undressing Upper body dressing   What is the patient wearing?: Orthosis, Pull over shirt Orthosis activity level: Performed by patient  Upper body assist Assist Level: Set up assist    Lower Body Dressing/Undressing Lower body dressing      What is the patient wearing?: Pants     Lower body assist Assist for lower body dressing: Set up assist     Toileting Toileting    Toileting assist Assist for toileting: Contact Guard/Touching assist     Transfers Chair/bed transfer  Transfers assist     Chair/bed transfer assist level: Supervision/Verbal cueing Chair/bed transfer assistive device: Armrests   Locomotion Ambulation   Ambulation assist      Assist level: Supervision/Verbal cueing Assistive device: Walker-rolling Max distance: 150'   Walk 10 feet activity   Assist     Assist level: Supervision/Verbal cueing Assistive device: Walker-rolling   Walk 50 feet activity   Assist Walk 50 feet with 2 turns activity did not occur: Safety/medical concerns  Assist level: Supervision/Verbal cueing Assistive device: Walker-rolling    Walk 150 feet activity   Assist Walk 150 feet activity did  not occur: Safety/medical concerns  Assist level: Supervision/Verbal cueing Assistive device: Walker-rolling    Walk 10 feet on uneven surface  activity   Assist Walk 10 feet on uneven surfaces activity did not occur: Safety/medical concerns         Wheelchair     Assist Will patient use wheelchair at discharge?: Yes Type of  Wheelchair: Manual    Wheelchair assist level: Independent Max wheelchair distance: 150'    Wheelchair 50 feet with 2 turns activity    Assist        Assist Level: Independent   Wheelchair 150 feet activity     Assist Wheelchair 150 feet activity did not occur: Safety/medical concerns   Assist Level: Independent      Medical Problem List and Plan: 1.  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  Continue CIR  Plan for d/c tomorrow  Will see patient for transitional care management in 1-2 weeks post-discharge 2.  Antithrombotics: -DVT/anticoagulation:  Pharmaceutical: Heparin             -antiplatelet therapy: ASA 3. Pain Management:  Hydrocodone prn  Controlled on 7/10 4. Mood: Team to provide ego support.    Added Xanax prn anxiety.              -antipsychotic agents: N/A 5. Neuropsych: This patient is capable of making decisions on his own behalf.  Discussed with neuropsych- appreciate recs 6. Skin/Wound Care: Routine pressure relief measures. Monitor wound daily.  7. Fluids/Electrolytes/Nutrition: Monitor I/Os.   8. Lung cancer with bony mets: Follow-up heme/Onc recs.  Patient continues to await Heme/Onc follow up 9. Abdominal pain: Resolved  KUB personally reviewed, showing constipation  See #13 10.  Leucocytosis: Resolved   Monitor for signs of infection.    WBCs 9.0 on 7/8  Afebrile  Continue to monitor 11. Hypotension:  Monitor BP tid. Monitor for orthostatic symptoms  Soft, but asymptomatic on 7/10  Monitor with increased mobility 12. COPD with SOB:  Continue Pulmicort nebs.  13.  Bowels:   MiraLAX twice daily ordered on 7/2  1 time bowel regimen given on 7/2  Improved 14.  Hypokalemia: Resolved  Potassium 4.2 on 7/8  Continue to monitor 15.  Hypoalbuminemia  Supplement initiated on 7/2 16.  Acute blood loss  anemia  Hemoglobin 10.8 on 7/8  Continue to monitor 17.  Sleep disturbance  EKG reviewed  Ambien started on 7/6  Improved 18.  Hyponatremia  Sodium 132 on 7/8  Continue to monitor  LOS: 9 days A FACE TO FACE EVALUATION WAS PERFORMED  Reene Harlacher Lorie Phenix 05/05/2019, 9:19 AM

## 2019-05-05 NOTE — Discharge Instructions (Signed)
Inpatient Rehab Discharge Instructions  DEANNA BOEHLKE Discharge date and time:  05/06/19  Activities/Precautions/ Functional Status: Activity: no lifting, driving, or strenuous exercise for till cleared by MD Diet: regular diet Wound Care: keep wound clean and dry    Functional status:  ___ No restrictions     ___ Walk up steps independently _X__ 24/7 supervision/assistance   ___ Walk up steps with assistance ___ Intermittent supervision/assistance  ___ Bathe/dress independently ___ Walk with walker     ___ Bathe/dress with assistance ___ Walk Independently    ___ Shower independently ___ Walk with assistance    _X__ Shower with assistance _X__ No alcohol     ___ Return to work/school ________     COMMUNITY REFERRALS UPON DISCHARGE:    Home Health:   PT     OT                      Agency:  [facility] Phone: [phone]   Medical Equipment/Items Ordered: wheelchair, cushion, walker, tub bench                                                     Agency/Supplier:  [facility] @ [phone]     Special Instructions:    My questions have been answered and I understand these instructions. I will adhere to these goals and the provided educational materials after my discharge from the hospital.  Patient/Caregiver Signature _______________________________ Date __________  Clinician Signature _______________________________________ Date __________  Please bring this form and your medication list with you to all your follow-up doctor's appointments.

## 2019-05-05 NOTE — Progress Notes (Signed)
Physical Therapy Discharge Summary  Patient Details  Name: Paul Thornton MRN: [id number] Date of Birth: Jan 22, 1961  Today's Date: 05/05/2019 PT Individual Time: 6286-3817 AND 1002-1057 PT Individual Time Calculation (min): 55 min AND 55 min  Session 1: Pt in supine and agreeable to therapy, pain as detailed below. Performed functional mobility as detailed in discharge summary including bed mobility, transfers, ambulation, w/c mobility, and stair negotiation. Additionally practiced car transfer w/ supervision. Practiced ambulating in household environment while weaving around obstacles and cones w/ supervision and practiced furniture transfers to/from couch w/ supervision. Discussed importance of home set-up for safety including limiting area rugs and clutter. Returned to room and performed toilet transfer w/ supervision, supervision for pericare and LE garment management as well. Ended session in w/c, all needs in reach.  Session 2:  Pt in w/c and agreeable to therapy. Pt self-propelled w/c to/from day room to work on UE strengthening and endurance. NuStep 5 min @ level 4 x2 reps. Moderate increase in work of breathing, 7/10 on modified RPE scale. Ongoing discussion/education of energy conservation strategies. Provided w/ written handout as well. Instructed through HEP exercises for BLE strengthening including mini-squats, knee marches, hamstring curls, adduction squeezes, abduction resisted w/ orange theraband, heel raises, and heel slides. Pt demo-ed correctly and safely w/o assist from therapist. Provided w/ written handout and discussed frequency 3-4x/week, 3 sets of 10 of all exercises. Pt verbalized understanding of education and in agreement. Returned to room and ended session in supine, all needs in reach. Pt also donning/doffing TLSO independently this session.   Patient has met 10 of 10 long term goals due to improved activity tolerance, improved balance, increased strength, ability to  compensate for deficits and functional use of  right lower extremity and left lower extremity.  Patient to discharge at an ambulatory level Supervision and mod/i for w/c mobility. W/c primarily used for energy conservation.  Patient's care partner is independent to provide the necessary physical assistance at discharge. Pt able to direct his care appropriately and safely, no formal family education necessary.   Reasons goals not met: n/a  Recommendation:  Patient will benefit from ongoing skilled PT services in home health setting to continue to advance safe functional mobility, address ongoing impairments in functional LE strength, balance, endurance, and proprioception and minimize fall risk.  Equipment: w/c and RW  Reasons for discharge: treatment goals met and discharge from hospital  Patient/family agrees with progress made and goals achieved: Yes  PT Discharge Precautions/Restrictions Precautions Precautions: Back Spinal Brace: Thoracolumbosacral orthotic Restrictions Weight Bearing Restrictions: No Vital Signs Therapy Vitals Temp: 97.8 F (36.6 C) Temp Source: Oral Pulse Rate: 83 Resp: 16 BP: (!) 90/58 Patient Position (if appropriate): Lying Oxygen Therapy SpO2: 99 % O2 Device: Room Air Pain Pain Assessment Pain Scale: 0-10 Pain Score: 6  Pain Type: Acute pain Pain Location: Back Pain Orientation: Mid Pain Descriptors / Indicators: Aching Pain Onset: On-going Pain Intervention(s): RN made aware Vision/Perception  Perception Perception: Within Functional Limits Praxis Praxis: Intact  Cognition Overall Cognitive Status: Within Functional Limits for tasks assessed Arousal/Alertness: Awake/alert Orientation Level: Oriented X4 Sustained Attention: Appears intact Selective Attention: Appears intact Memory: Appears intact Awareness: Appears intact Problem Solving: Appears intact Safety/Judgment: Appears intact Sensation Sensation Light Touch: Impaired by  gross assessment(impaired in distal toes) Coordination Gross Motor Movements are Fluid and Coordinated: No Coordination and Movement Description: MIld ataxia w/ LE open chain movements L>R Heel Shin Test: WNL Motor  Motor Motor: Hemiplegia Motor - Discharge Observations:  mild L hemi remains, generalized weakness  Mobility Bed Mobility Bed Mobility: Rolling Right;Rolling Left;Sit to Supine;Supine to Sit Rolling Right: Independent Rolling Left: Independent Left Sidelying to Sit: Independent Supine to Sit: Independent Sit to Supine: Independent Transfers Transfers: Sit to Stand;Stand to [employer] Transfers Sit to Stand: Supervision/Verbal cueing Stand to Sit: Supervision/Verbal cueing Stand Pivot Transfers: Supervision/Verbal cueing Transfer (Assistive device): Other (Comment)(armrests) Locomotion  Gait Gait Assistance: Supervision/Verbal cueing Gait Distance (Feet): 150 Feet Assistive device: Rolling walker Gait Assistance Details: Verbal cues for safe use of DME/AE Gait Gait: Yes Gait Pattern: Impaired Gait Pattern: Ataxic;Narrow base of support Gait velocity: decreased Stairs / Additional Locomotion Stairs: Yes Stairs Assistance: Supervision/Verbal cueing Stair Management Technique: Two rails Number of Stairs: 12 Height of Stairs: 6 Curb: Supervision/Verbal Location manager Mobility: Yes Wheelchair Assistance: Independent with Camera operator: Both upper extremities Wheelchair Parts Management: Independent Distance: 150'  Trunk/Postural Assessment  Cervical Assessment Cervical Assessment: Within Functional Limits Thoracic Assessment Thoracic Assessment: Exceptions to WFL(TLSO) Lumbar Assessment Lumbar Assessment: Exceptions to WFL(back precautions, TLSO) Postural Control Postural Control: Within Functional Limits  Balance Static Sitting Balance Static Sitting - Level of Assistance: 7: Independent Dynamic  Sitting Balance Dynamic Sitting - Level of Assistance: 7: Independent Static Standing Balance Static Standing - Level of Assistance: 5: Stand by assistance Dynamic Standing Balance Dynamic Standing - Level of Assistance: 5: Stand by assistance Extremity Assessment  RLE Assessment RLE Assessment: Exceptions to [facility] Passive Range of Motion (PROM) Comments: WFL RLE Strength RLE Overall Strength: Deficits Right Hip Flexion: 3+/5 Right Hip Extension: 4-/5 Right Hip ABduction: 4/5 Right Hip ADduction: 4/5 Right Knee Flexion: 3+/5 Right Knee Extension: 4/5 Right Ankle Dorsiflexion: 3+/5 Right Ankle Plantar Flexion: 3+/5 LLE Assessment LLE Assessment: Exceptions to [facility] Passive Range of Motion (PROM) Comments: WFL LLE Strength LLE Overall Strength: Deficits Left Hip Flexion: 3+/5 Left Hip Extension: 3+/5 Left Hip ABduction: 4-/5 Left Hip ADduction: 4-/5 Left Knee Flexion: 3+/5 Left Knee Extension: 4-/5 Left Ankle Dorsiflexion: 3/5 Left Ankle Plantar Flexion: 3+/5    Michalla Ringer K Azalee Weimer 05/05/2019, 8:59 AM

## 2019-05-05 NOTE — Progress Notes (Signed)
Occupational Therapy Discharge Summary  Patient Details  Name: Paul Thornton MRN: [id number] Date of Birth: 03-17-1961  Today's Date: 05/05/2019 OT Individual Time: 1300-1400 OT Individual Time Calculation (min): 60 min   1:1 GRAD DAY self care retraining at shower level in the ADL apartment with focus on functional ambulation with RW around the bathroom, tub bench transfer, d/c planning with focus on sequencing to maintain back precautions, and standing tolerance. Pt reports more fatigue today with activity and requests to not walk long distances.   Transitioned to the gym and continued focus on LE strengthening, standing balance and LE coordination. Sidestepping through agility ladder with UE support, laterally [place] stepping with UE support and sit to stands without UE support. Performed standing LE exercises with UE support on bar for lateral side kicks and stand <>squats.  Returned to EOB to finish lunch at end of session.    Patient has met 12 of 12 long term goals due to improved activity tolerance, improved balance, postural control, ability to compensate for deficits, functional use of  RIGHT lower and LEFT lower extremity and improved coordination.  Patient to discharge at overall mod I to supervision  level.  Patient's care partner is independent to provide the necessary physical assistance at discharge prn.  Reasons goals not met: n/a  Recommendation:  Patient will benefit from ongoing skilled OT services in home health setting to continue to advance functional skills in the area of BADL and Reduce care partner burden.  Equipment: w/c RW and tub bench  Reasons for discharge: treatment goals met and discharge from hospital  Patient/family agrees with progress made and goals achieved: Yes  OT Discharge Precautions/Restrictions  Precautions Precautions: Back Required Braces or Orthoses: Spinal Brace Spinal Brace: Thoracolumbosacral orthotic;Applied in sitting  position Restrictions Weight Bearing Restrictions: No General   Vital Signs Therapy Vitals Temp: 97.7 F (36.5 C) Temp Source: Oral Pulse Rate: 82 Resp: 17 BP: (!) 98/57 Patient Position (if appropriate): Sitting Oxygen Therapy SpO2: 98 % O2 Device: Room Air Pain  soreness along incision ADL ADL Eating: Independent Where Assessed-Eating: Bed level Grooming: Independent Where Assessed-Grooming: Standing at sink Upper Body Bathing: Modified independent Where Assessed-Upper Body Bathing: Shower Lower Body Bathing: Modified independent Where Assessed-Lower Body Bathing: Shower Upper Body Dressing: Modified independent (Device) Where Assessed-Upper Body Dressing: Sitting at sink Lower Body Dressing: Modified independent Where Assessed-Lower Body Dressing: Sitting at sink Toileting: Modified independent Where Assessed-Toileting: Glass blower/designer: Modified Programmer, applications Method: Counselling psychologist: Energy manager: Distant supervision Tub/Shower Transfer Method: Optometrist: Radio broadcast assistant ADL Comments: not cleared to shower at this time Vision Baseline Vision/History: No visual deficits Wears Glasses: At all times Patient Visual Report: No change from baseline Vision Assessment?: No apparent visual deficits Perception  Perception: Within Functional Limits Praxis Praxis: Intact Cognition Overall Cognitive Status: Within Functional Limits for tasks assessed Arousal/Alertness: Awake/alert Orientation Level: Oriented X4 Attention: Sustained Sustained Attention: Appears intact Selective Attention: Appears intact Memory: Appears intact Awareness: Appears intact Problem Solving: Appears intact Safety/Judgment: Appears intact Sensation Sensation Light Touch: Appears Intact Hot/Cold: Appears Intact Proprioception: Impaired Detail Proprioception Impaired Details: Impaired LLE;Impaired  RLE Motor  Motor Motor: Within Functional Limits Mobility  Bed Mobility Rolling Right: Independent Rolling Left: Independent Left Sidelying to Sit: Independent Supine to Sit: Independent Sit to Supine: Independent Transfers Sit to Stand: Supervision/Verbal cueing Stand to Sit: Supervision/Verbal cueing  Trunk/Postural Assessment  Cervical Assessment Cervical Assessment: Within Functional Limits Thoracic Assessment Thoracic Assessment:  Exceptions to [facility] Thoracic AROM Overall Thoracic AROM Comments: Forward/rounded shoulders Lumbar Assessment Lumbar Assessment: Exceptions to [facility] Postural Control Postural Control: Within Functional Limits  Balance Static Standing Balance Static Standing - Level of Assistance: 6: Modified independent (Device/Increase time) Dynamic Standing Balance Dynamic Standing - Level of Assistance: 6: Modified independent (Device/Increase time) Extremity/Trunk Assessment RUE Assessment RUE Assessment: Within Functional Limits General Strength Comments: 4+/5 LUE Assessment LUE Assessment: Within Functional Limits General Strength Comments: 4+/5   Willeen Cass [facility] 05/05/2019, 3:53 PM

## 2019-05-06 NOTE — Progress Notes (Signed)
Paul Thornton to be D/C'd per MD order. Discussed with the patient and all questions fully answered. ? VSS, Skin clean, dry and intact without evidence of skin break down, no evidence of skin tears noted. ? An After Visit Summary was printed and given to the patient. Patient informed where to pickup prescriptions. ? D/C education completed with patient/family including follow up instructions, medication list, d/c activities limitations if indicated, with other d/c instructions as indicated by MD - patient able to verbalize understanding, all questions fully answered.  ? Patient instructed to return to ED, call 911, or call MD for any changes in condition.  ? Patient to be escorted via [place], and D/C home via private auto.

## 2019-05-08 ENCOUNTER — Telehealth: Payer: Self-pay | Admitting: *Deleted

## 2019-05-08 ENCOUNTER — Other Ambulatory Visit: Payer: Self-pay | Admitting: Radiation Oncology

## 2019-05-08 DIAGNOSIS — C3490 Malignant neoplasm of unspecified part of unspecified bronchus or lung: Secondary | ICD-10-CM

## 2019-05-08 NOTE — Progress Notes (Signed)
Social Work Discharge Note   The overall goal for the admission was met for:   Discharge location: Yes - home with mother and sister to assist as needed.  Length of Stay: Yes - 10 days  Discharge activity level: Yes - supervision to modified independent  Home/community participation: Yes  Services provided included: MD, RD, PT, OT, RN, TR, Pharmacy, Neuropsych and SW  Financial Services: Medicaid and SSD application begun while here via Financial Counseling dept  Follow-up services arranged: Home Health: PT, OT via [facility], DME: 16x16 lightweight w/c, cushion, rolling walker and tub bench via [facility], Other: referred pt to [facility] med assist program and provided information / insturction for enrolling himself in [facility] and Patient/Family has no preference for HH/DME agencies  Comments (or additional information):      Contact:  Pt @ [phone]        Sister, Sharon Euliss @ [phone]  Patient/Family verbalized understanding of follow-up arrangements: Yes  Individual responsible for coordination of the follow-up plan: pt  Confirmed correct DME delivered: HOYLE, LUCY 05/08/2019    HOYLE, LUCY 

## 2019-05-08 NOTE — Telephone Encounter (Signed)
Called patient to inform of Pet Scan on 05-11-19 - arrival time- 12:30 pm , pt. to be NPO- 6 hrs. prior to test, test to be @ [facility], spoke with patient and he is aware of this test and all the instructions

## 2019-05-08 NOTE — Discharge Summary (Signed)
Physician Discharge Summary  Patient ID: Paul Thornton MRN: [id number] DOB/AGE: 1961/02/09 58 y.o.  Admit date: 04/26/2019 Discharge date: 05/06/2019  Discharge Diagnoses:  Principal Problem:   Acute cerebral infarction [facility]) Active Problems:   Metastatic cancer to spine ([place])   Pain   Acute blood loss anemia   Hypoalbuminemia due to protein-calorie malnutrition (HCC)   Drug induced constipation   Hypokalemia   Sleep disturbance   Hyponatremia   Discharged Condition: stable   Significant Diagnostic Studies: N/A   Labs:  Basic Metabolic Panel: BMP Latest Ref Rng & Units 05/03/2019 05/01/2019 04/28/2019  Glucose 70 - 99 mg/dL 106(H) 101(H) -  BUN 6 - 20 mg/dL 10 17 -  Creatinine 0.61 - 1.24 mg/dL 0.59(L) 0.65 -  Sodium 135 - 145 mmol/L 132(L) 130(L) -  Potassium 3.5 - 5.1 mmol/L 4.2 4.1 4.1  Chloride 98 - 111 mmol/L 96(L) 93(L) -  CO2 22 - 32 mmol/L 27 29 -  Calcium 8.9 - 10.3 mg/dL 8.9 8.8(L) -    CBC: CBC Latest Ref Rng & Units 05/03/2019 05/01/2019 04/27/2019  WBC 4.0 - 10.5 K/uL 9.0 14.8(H) 11.4(H)  Hemoglobin 13.0 - 17.0 g/dL 10.8(L) 11.7(L) 10.9(L)  Hematocrit 39.0 - 52.0 % 33.0(L) 36.0(L) 33.7(L)  Platelets 150 - 400 K/uL 520(H) 539(H) 475(H)    CBG: No results for input(s): GLUCAP in the last 168 hours.  Brief HPI:   Paul Thornton is a 58 year old male with history of COPD, tobacco abuse who was admitted via ED on 04/19/2019 with 5-day history of wheezing, cough, chest wall pain and bilateral leg 20 weakness.  Work-up revealed large 7.2 x 6.2 x 6.9 similar RUL neoplasm invading adjacent paratracheal region and right as well as right posterior pleura, right fourth and fifth ribs as well as T4 and T5 vertebral bodies with marked collapse of T4 vertebral body with invasion of T5 elements on the right.  He was started on doxycycline due to concerns of COPD exacerbation versus bronchitis.  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    MRI brain done revealing right frontal infarct with premature white matter disease with age atrophy.  Neurology consult for input and recommended aspirin for secondary stroke prevention.  Stroke felt to be due to atherosclerosis versus cancer related hypercoagulopathy. Patient reported he was transferred to [facility] and underwent laminectomy with decompression of thoracic spine from T3-T5 and vertebrectomies T3-T4 by Dr. Ellene Route on 6/26.  He was treated with IV steroids..  His case was presented to CNS tumor board on 629 and heme-onc to follow for input.  Patient continued to be limited by bilateral lower extremity weakness with unsteady gait and CIR was recommended due to functional decline.     Hospital Course: Paul Thornton was admitted to rehab 04/26/2019 for inpatient therapies to consist of PT and OT at least three hours five days a week. Past admission physiatrist, therapy team and rehab RN have worked together to provide customized collaborative inpatient rehab. Blood pressures have been monitored on bid basis and have been soft but no orthostatic symptoms reported. Back incision is C/D/I and healing well. Pain has been controlled with prn use of hydrocodone. KUB was ordered due to reports of abdominal pain and he was found to have large amount of stool throughout his colon. He was treated with enemas and then started on bowel program to help  manage OIC.   Hypokalemia has resolved with supplementation.  Follow up BMET showed mild hyponatremia.  Follow up CBC showed that leucocytosis has resolved and ABLA is stable. Heparin was used for DVT prophylaxis and he continues on ASA for stroke prevention. His respiratory status has improved and he continues on Pulmicort MDI at discharge.  Anxiety has been managed with use of xanax prn and Ambien was added to help manage insomnia.  Heme Onc was contacted for input on  further treatment and plans to follow up with patient after discharge.  He has made good progress during her rehab stay and is modified independent to supervision level at discharge. He will continue to receive follow up HHPT and [facility] after discharge.    Rehab course: During patient's stay in rehab weekly team conferences were held to monitor patient's progress, set goals and discuss barriers to discharge. At admission, patient required mod assist with mobility and with basic self-care tasks. He  has had improvement in activity tolerance, balance, postural control as well as ability to compensate for deficits. He has had improvement in functional use RUE  and LLE as well as improvement in awareness. He is able to complete ADL tasks at modified independent level and requires supervision with shower transfers.  He requires supervision with verbal cues for transfers, to ambulate 150' with RW and to climb 12 stairs.     Disposition: Home  Diet: Heart Healthy.   Special Instructions: 1. Recommend repeat BMET in a couple of weeks for follow up on hyponatremia.    Discharge Instructions    Ambulatory referral to Physical Medicine Rehab   Complete by: As directed    1-2 weeks transitional care appt     Allergies as of 05/06/2019      Reactions   Amoxicillin Hives   All "cillins"   Penicillins Hives   Did it involve swelling of the face/tongue/throat, SOB, or low BP? No Did it involve sudden or severe rash/hives, skin peeling, or any reaction on the inside of your mouth or nose? No Did you need to seek medical attention at a hospital or doctor's office? No When did it last happen? If all above answers are "NO", may proceed with cephalosporin use.      Medication List    STOP taking these medications   feeding supplement (ENSURE ENLIVE) Liqd     TAKE these medications   albuterol 108 (90 Base) MCG/ACT inhaler Commonly known as: VENTOLIN HFA Inhale 1-2 puffs  into the lungs every 6 (six) hours as needed for wheezing or shortness of breath.   ALPRAZolam 0.25 MG tablet Commonly known as: XANAX Take 1 tablet (0.25 mg total) by mouth 3 (three) times daily as needed for anxiety.   aspirin 325 MG EC tablet Take 1 tablet (325 mg total) by mouth daily.   Budesonide 90 MCG/ACT inhaler Inhale 2 puffs into the lungs 2 (two) times daily.   famotidine 20 MG tablet Commonly known as: PEPCID Take 1 tablet (20 mg total) by mouth 2 (two) times daily.   HYDROcodone-acetaminophen 7.5-325 MG tablet--Rx # 120 pills. Commonly known as: NORCO Take 1 tablet by mouth every 4 (four) hours as needed for severe pain.   nicotine 14 mg/24hr patch Commonly known as: NICODERM CQ - dosed in mg/24 hours Place 1 patch (14 mg total) onto the skin daily.   polyvinyl alcohol 1.4 % ophthalmic solution Commonly known as: LIQUIFILM TEARS Place 1 drop into both eyes as  needed for dry eyes.   zolpidem 5 MG tablet--Rx # 30 Commonly known as: AMBIEN Take 1 tablet (5 mg total) by mouth at bedtime as needed for sleep.      Follow-up Information    Jamse Arn, MD Follow up.   Specialty: Physical Medicine and Rehabilitation Why: Office will call you with follow up appointment Contact information: [address] [place] [address] [phone]        Kristeen Miss, MD. Call.   Specialty: Neurosurgery Why: for post op appointment Contact information: [address] [address] [phone]        Derek Jack, MD. Call on 05/08/2019.   Specialty: Hematology Why: for follow appt Contact information: [address]  [place] [address] [phone]           Signed: Bary Leriche 05/08/2019, 9:38 AM

## 2019-05-08 NOTE — Progress Notes (Signed)
Histology and Location of Primary Cancer: Suspected Lung Cancer  Sites of Visceral and Bony Metastatic Disease: bony  Location(s) of Symptomatic Metastases: T3, T4, T5  Past/Anticipated chemotherapy by medical oncology, if any: Scheduled for consult with Dr. Redge Gainer on 05/16/2019  Pain on a scale of 0-10 is: Reports intermittent middle back pain. Reports taking Norco 1 tablet every six hours for a total of 4 pills per day.    If Spine Met(s), symptoms, if any, include:  Bowel/Bladder retention or incontinence (please describe): Denies LUTS. Reports some constipation related to pain medication. Reports taking MOM and prune juice for constipation.   Numbness or weakness in extremities (please describe): Reports he did not have feeling in left leg prior to back surgery. Denies numbness or tingling of extremities now though.   Current Decadron regimen, if applicable: Denies  Ambulatory status? Walker? Wheelchair?: Ambulatory with Assistance. Patient ambulates with the aid of a walker. Patient is working with PT.   SAFETY ISSUES:  Prior radiation? no  Pacemaker/ICD? no  Possible current pregnancy? no, male patient   Is the patient on methotrexate? no  Current Complaints / other details:  58 year old male. Single. No children. Sister, [name], participated in consult today and ensures the patient get to his appointments.     58 year old right-handed individual who tells me that he has had some element of back pain and chest wall pain on the right side for several months time.  This is become progressively worse and a few days ago he started note that he had increasing back pain in the upper thoracic spine and he started develop weakness in his legs.  He was admitted to the hospital where he underwent an MRI of his thoracic spine that demonstrates a large mass in the right chest cavity eroding through the vertebrae from T3-T5.  At T4 and T5 there is significant erosion of the pedicles  and compression of the spinal cord.

## 2019-05-09 ENCOUNTER — Ambulatory Visit
Admit: 2019-05-09 | Discharge: 2019-05-09 | Disposition: A | Discharge status: Home / Self Care | Payer: Self-pay | Attending: Radiation Oncology | Admitting: Radiation Oncology

## 2019-05-09 ENCOUNTER — Encounter: Payer: Self-pay | Admitting: Radiation Oncology

## 2019-05-09 ENCOUNTER — Telehealth: Payer: Self-pay | Admitting: Registered Nurse

## 2019-05-09 VITALS — Ht 69.0 in | Wt 99.0 lb

## 2019-05-09 DIAGNOSIS — C3411 Malignant neoplasm of upper lobe, right bronchus or lung: Secondary | ICD-10-CM

## 2019-05-09 DIAGNOSIS — C7951 Secondary malignant neoplasm of bone: Secondary | ICD-10-CM

## 2019-05-09 NOTE — Telephone Encounter (Signed)
Transitional Care call  Patient name: Paul Thornton, Paul Thornton DOB: June 27, 1961 1. Are you/is patient experiencing any problems since coming home? No a. Are there any questions regarding any aspect of care? No 2. Are there any questions regarding medications administration/dosing? No a. Are meds being taken as prescribed? Yes b. "Patient should review meds with caller to confirm" Medication List Reviewed 3. Have there been any falls? No 4. Has Home Health been to the house and/or have they contacted you? Yes, [facility]. a. If not, have you tried to contact them? NA b. Can we help you contact them? NA 5. Are bowels and bladder emptying properly? Yes a. Are there any unexpected incontinence issues? No b. If applicable, is patient following bowel/bladder programs? NA 6. Any fevers, problems with breathing, unexpected pain? No 7. Are there any skin problems or new areas of breakdown? No 8. Has the patient/family member arranged specialty MD follow up (ie cardiology/neurology/renal/surgical/etc.)?  Ms. [name] ( sister) has scheduled HFU appointments she reports.  a. Can we help arrange? NA 9. Does the patient need any other services or support that we can help arrange? No 10. Are caregivers following through as expected in assisting the patient? Yes 11. Has the patient quit smoking, drinking alcohol, or using drugs as recommended? [name] states he doesn't smoke, drink alcohol or use illicit drugs.   Appointment date/time 05/12/2019  arrival time 1:20 for 1:40 appointment with Dr. Posey Pronto. At [facility]

## 2019-05-09 NOTE — Progress Notes (Signed)
Radiation Oncology         [phone] ________________________________  Initial Outpatient Consultation - Conducted via telephone due to current COVID-19 concerns for limiting patient exposure  Name: Paul Thornton MRN: [id number]  Date of Service: 05/09/2019 DOB: [birth year]  PZ:WCHENID, No Pcp Per  No ref. provider found   REFERRING PHYSICIAN: No ref. provider found  DIAGNOSIS: 58 y.o. 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    ICD-10-CM   1. Secondary malignant neoplasm of bone and bone marrow (HCC)  C79.51    C79.52     HISTORY OF PRESENT ILLNESS: Paul Thornton is a 58 y.o. maleseen in consultation today for management of lung cancer with metastatic disease to the thoracic spine resulting in severe cord compression. He initially presented to the ED at [facility] with shortness of breath, cough, and pleuritic chest pain. A CT scan of the chest with PE protocol on 04/19/2019 showed a large 7.2 cm neoplasm arising from the posterior segment right upper lobe with invasion of the adjacent paratracheal region on the right as well as right posterior pleura and bony structures, most notably portions of the right 4th and 5th ribs posteriorly and T4 and T5 vertebral bodies with marked collapse of the T4 vertebral body and nvasion of the posterior elements at T5 on the right noted. There was also AP window adenopathy. He reported at least 20 pound weight loss in the last few months, 8/10 chest wall pain with no alleviating factors as well as 2 falls in the last 3 days prior to admission. He also reported progressive low back pain for 1-2 months and that recently his left leg felt weaker than the right leg and his legs give out when he walks causing him to fall. On admission, he could not urinate and a Foley catheter was placed in the ER.  He was  further evaluated with MRI of the thoracic spine on 04/20/2019 which showed a 7 x 8 cm paravertebral mass centered at T4 on the right with invasion of T3, T4, and T5, a pathologic T4 compression fracture and predominantly right-sided and ventral epidural tumor with cord compression maximal at T4 with abnormal cord signal. No compressive disc herniation. Suspected additional 12 mm left T12 metastasis.   Brain MRI performed 04/20/2019 did not show abnormal intracranial enhancement of the brain or meninges to suggest metastatic disease.  A CT-guided biopsy of the right lung mass was performed on 04/20/2019 confirming NSCLC, adenocarcinoma.  The patient underwent emergency decompression/debulking with laminectomy and partial vertebrectomies of T3 and T4 with reconstruction and fixation with pedicle screws from T2-T6 with Dr. Ellene Route on 04/21/2019.  Pathology confirmed adenocarcinoma.  He has noted significant improvement in his lower extremity strength and sensation and has regained bladder function since the time of surgery and was able to discharge home on 05/06/2019.  He is scheduled for PET scan on 05/12/2019 to complete his disease staging.  The patient has kindly been referred today for discussion of potential radiation treatment options. His sister, Paul Thornton, is present and participating in this telephone consult visit with him today.  PREVIOUS RADIATION THERAPY: No  PAST MEDICAL HISTORY:  Past Medical History:  Diagnosis Date   Bronchitis    Cancer ([place])    COPD (chronic obstructive pulmonary disease) ([place])    GERD (gastroesophageal reflux disease)    Stroke ([place])  PAST SURGICAL HISTORY: Past Surgical History:  Procedure Laterality Date   POSTERIOR LUMBAR FUSION 4 LEVEL N/A 04/20/2019   Procedure: POSTERIOR THORACIC FUSION 5 LEVEL;  Surgeon: Kristeen Miss, MD;  Location: [facility];  Service: Neurosurgery;  Laterality: N/A;  POSTERIOR THORACIC FUSION 5 LEVEL    FAMILY HISTORY:  Family  History  Problem Relation Age of Onset   High blood pressure Mother    Lung cancer Father    Rectal cancer Maternal Grandmother    Lung cancer Paternal Grandfather     SOCIAL HISTORY:  Social History   Socioeconomic History   Marital status: Single    Spouse name: Not on file   Number of children: 0   Years of education: Not on file   Highest education level: Not on file  Occupational History   Not on file  Social Needs   Financial resource strain: Not on file   Food insecurity    Worry: Not on file    Inability: Not on file   Transportation needs    Medical: Not on file    Non-medical: Not on file  Tobacco Use   Smoking status: Former Smoker    Packs/day: 0.25    Years: 40.00    Pack years: 10.00    Types: Cigarettes    Quit date: 04/19/2019    Years since quitting: 0.0   Smokeless tobacco: Never Used  Substance and Sexual Activity   Alcohol use: No   Drug use: No   Sexual activity: Not Currently  Lifestyle   Physical activity    Days per week: Not on file    Minutes per session: Not on file   Stress: Not on file  Relationships   Social connections    Talks on phone: Not on file    Gets together: Not on file    Attends religious service: Not on file    Active member of club or organization: Not on file    Attends meetings of clubs or organizations: Not on file    Relationship status: Not on file   Intimate partner violence    Fear of current or ex partner: Not on file    Emotionally abused: Not on file    Physically abused: Not on file    Forced sexual activity: Not on file  Other Topics Concern   Not on file  Social History Narrative   Single. No children. Sister, Paul Thornton, ensures the patient gets to his appointments.     ALLERGIES: Amoxicillin and Penicillins  MEDICATIONS:  Current Outpatient Medications  Medication Sig Dispense Refill   albuterol (VENTOLIN HFA) 108 (90 Base) MCG/ACT inhaler Inhale 1-2 puffs into the lungs  every 6 (six) hours as needed for wheezing or shortness of breath. 18 g 1   ALPRAZolam (XANAX) 0.25 MG tablet Take 1 tablet (0.25 mg total) by mouth 3 (three) times daily as needed for anxiety. 60 tablet 0   aspirin EC 325 MG EC tablet Take 1 tablet (325 mg total) by mouth daily. 30 tablet 0   Budesonide 90 MCG/ACT inhaler Inhale 2 puffs into the lungs 2 (two) times daily. 1 each 0   famotidine (PEPCID) 20 MG tablet Take 1 tablet (20 mg total) by mouth 2 (two) times daily. 60 tablet 1   HYDROcodone-acetaminophen (NORCO) 7.5-325 MG tablet Take 1 tablet by mouth every 4 (four) hours as needed for severe pain. 120 tablet 0   nicotine (NICODERM CQ - DOSED IN MG/24 HOURS) 14 mg/24hr patch  Place 1 patch (14 mg total) onto the skin daily. 28 patch 0   polyvinyl alcohol (LIQUIFILM TEARS) 1.4 % ophthalmic solution Place 1 drop into both eyes as needed for dry eyes. 15 mL 0   zolpidem (AMBIEN) 5 MG tablet Take 1 tablet (5 mg total) by mouth at bedtime as needed for sleep. 15 tablet 0   No current facility-administered medications for this encounter.     REVIEW OF SYSTEMS:  On review of systems, the patient reports that he is doing well overall. He currently denies any chest pain, shortness of breath, cough, fevers, chills, night sweats, or further unintended weight loss. He denies any bladder disturbances s/p catheter removal, and denies abdominal pain, nausea or vomiting. He reports some constipation related to pain medication; reports taking MOM and prune juice for constipation. He reports intermittent mid back pain which has significantly improved since surgery; reports taking Norco 1 tablet every 6 hours for a total of 4 pills per day. He is ambulatory with aid of walker after working with PT in inpatient rehab for a week. He has regained full sensation in the left lower extremity and significant improvement in LE strength following surgery.  He denies numbness or tingling of extremities currently and  is quite pleased with his progress overall. A complete review of systems is obtained and is otherwise negative.  PHYSICAL EXAM: Unable to assess in light of telephone consultation format.  Wt Readings from Last 3 Encounters:  05/09/19 99 lb (44.9 kg)  05/06/19 103 lb 3.2 oz (46.8 kg)  04/21/19 103 lb 2.8 oz (46.8 kg)   Temp Readings from Last 3 Encounters:  05/06/19 98.1 F (36.7 C) (Oral)  04/26/19 97.6 F (36.4 C) (Oral)  04/20/19 (!) 97.4 F (36.3 C) (Oral)   BP Readings from Last 3 Encounters:  05/06/19 90/62  04/26/19 (!) 89/65  04/20/19 104/76   Pulse Readings from Last 3 Encounters:  05/06/19 79  04/26/19 81  04/20/19 92   Pain Assessment Pain Score: 7  Pain Frequency: Intermittent Pain Loc: Back(middle of back)/10   LABORATORY DATA:  Lab Results  Component Value Date   WBC 9.0 05/03/2019   HGB 10.8 (L) 05/03/2019   HCT 33.0 (L) 05/03/2019   MCV 91.7 05/03/2019   PLT 520 (H) 05/03/2019   Lab Results  Component Value Date   NA 132 (L) 05/03/2019   K 4.2 05/03/2019   CL 96 (L) 05/03/2019   CO2 27 05/03/2019   Lab Results  Component Value Date   ALT 36 04/27/2019   AST 18 04/27/2019   ALKPHOS 88 04/27/2019   BILITOT 0.6 04/27/2019     RADIOGRAPHY: Dg Thoracic Spine 2 View  Result Date: 04/21/2019 CLINICAL DATA:  Five level fusion. EXAM: DG C-ARM 61-120 MIN; THORACIC SPINE 2 VIEWS COMPARISON:  04/20/2019 FINDINGS: Chest x-ray from yesterday upper thoracic posterior rod and pedicle screw fusion beginning at T2 and continuing inferiorly for treatment of extensive thoracic metastatic disease. Unremarkable hardware positioning. IMPRESSION: Fluoroscopy for intraoperative guidance and upper thoracic fusion. Electronically Signed   By: Monte Fantasia M.D.   On: 04/21/2019 04:06   Dg Abd 1 View  Result Date: 04/26/2019 CLINICAL DATA:  Epigastric pain x3 days EXAM: ABDOMEN - 1 VIEW COMPARISON:  April 21, 2019 FINDINGS: There is a large amount of stool  throughout the colon. The liver is somewhat enlarged. The bowel gas pattern is nonobstructive and nonspecific. There is no acute osseous abnormality. There are degenerative changes throughout the  lumbar spine and hips. The enteric tube has been removed. IMPRESSION: 1. Nonobstructive bowel gas pattern. 2. Above average amount of stool throughout the colon. Electronically Signed   By: Constance Holster M.D.   On: 04/26/2019 20:43   Ct Angio Chest Pe W And/or Wo Contrast  Result Date: 04/19/2019 CLINICAL DATA:  Chest pain EXAM: CT ANGIOGRAPHY CHEST WITH CONTRAST TECHNIQUE: Multidetector CT imaging of the chest was performed using the standard protocol during bolus administration of intravenous contrast. Multiplanar CT image reconstructions and MIPs were obtained to evaluate the vascular anatomy. CONTRAST:  179mL OMNIPAQUE IOHEXOL 350 MG/ML SOLN COMPARISON:  April 19, 2019 chest radiograph FINDINGS: Cardiovascular: There is no demonstrable pulmonary embolus. There is no thoracic aortic aneurysm or dissection. The visualized great vessels appear unremarkable. There is no pericardial effusion or pericardial thickening. Mediastinum/Nodes: Thyroid appears unremarkable. There is an aortopulmonary lymph node measuring 1.7 x 1.5 cm. There are subcentimeter right hilar lymph nodes. No esophageal lesion evident. Lungs/Pleura: There is underlying centrilobular and paraseptal emphysematous change. There are bullae in the upper lobes as well. There is a mass arising in the posterior segment of the right lower lobe which invades the right paratracheal region of the mediastinum as well as the posterior right pleura in the right upper hemithorax. There is extensive bony destruction involving portions of the right fourth and fifth ribs as well as bony destruction and the T4 and T5 vertebral bodies. There is marked collapse of the T4 vertebral body with extensive tumor throughout this area due to the adjacent large mass. This mass  measures 6.9 cm from right to left dimension, 7.2 cm from superior to inferior dimension, and 6.2 cm from anterior to posterior dimension. There is patchy atelectasis and probable mild postobstructive pneumonitis in the posterior segment right upper lobe. No similar mass lesions are noted elsewhere. No evident pleural effusion. Upper Abdomen: There is hepatic steatosis. Visualized upper abdominal structures otherwise appear unremarkable. Musculoskeletal: Bony destruction involving portions of the T5 and T4 vertebral bodies with marked collapse of the T4 vertebral body. There is destruction of portions of the posterior right fourth and fifth ribs due to the large tumor in this area. There is destruction of portions of the posterior elements at T5 on the right. Other bony structures appear unremarkable. No chest wall lesions are evident. Review of the MIP images confirms the above findings. IMPRESSION: 1. No demonstrable pulmonary embolus. No thoracic aortic aneurysm or dissection. 2. 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 Marked collapse of the T4 vertebral body noted. Invasion of the posterior elements at T5 on the right noted. This mass measures 7.2 x 6.2 x 6.9 cm. 3.  Extensive underlying emphysematous change. 4.  Aortopulmonary window region adenopathy. 5.  Hepatic steatosis. Emphysema (ICD10-J43.9). Electronically Signed   By: Lowella Grip III M.D.   On: 04/19/2019 14:56   Mr Angio Head Wo Contrast  Result Date: 04/20/2019 CLINICAL DATA:  Infarcts.  Weakness.  Headache. EXAM: MRA HEAD WITHOUT CONTRAST TECHNIQUE: Angiographic images of the Circle of Willis were obtained using MRA technique without intravenous contrast. COMPARISON:  MRI brain reported separately.  FINDINGS: The internal carotid arteries are widely patent. The basilar artery is widely patent with vertebrals codominant. The anterior cerebral arteries are widely patent, and  the anterior communicating artery connects the distal A1 segments. There is a 50-75% stenosis at the proximal A2 LEFT ACA. The proximal RIGHT and LEFT M1 MCA segments are widely patent. Both superior and inferior MCA divisions are patent. There is moderate irregularity of the M3 segments bilaterally, RIGHT greater than LEFT consistent with intracranial atherosclerotic disease. Unremarkable posterior cerebral arteries. No cerebellar branch occlusion. No saccular aneurysm. IMPRESSION: Mild intracranial atherosclerotic disease. No proximal flow-limiting stenosis, occlusion, or dissection. Electronically Signed   By: Staci Righter M.D.   On: 04/20/2019 13:45   Mr Jeri Cos TK Contrast  Result Date: 04/20/2019 CLINICAL DATA:  Lung cancer staging. EXAM: MRI HEAD WITHOUT AND WITH CONTRAST TECHNIQUE: Multiplanar, multiecho pulse sequences of the brain and surrounding structures were obtained without and with intravenous contrast. CONTRAST:  Gadavist 4 mL. COMPARISON:  MRA intracranial reported separately. Thoracic spine MRI reported separately. FINDINGS: Brain: Subcentimeter foci of restricted diffusion RIGHT frontal cortex and subcortical white matter, corresponding low ADC, no similar findings on the LEFT, nor in the posterior circulation consistent with acute RIGHT MCA territory infarcts. These are adjacent to areas of chronic infarction, with brain substance loss and encephalomalacia. Lobe is chronic appearing infarcts are seen elsewhere in the RIGHT hemisphere, both MCA, and PCA territory. Shower of emboli is suspected. No acute hemorrhage, mass lesion, hydrocephalus, or extra-axial fluid. Premature for age atrophy. T2 and FLAIR hyperintensities in the white matter, predominantly affecting the pons/brainstem, query chronic microvascular  ischemic change. Post infusion, no abnormal enhancement of the brain or meninges is detected. The small infarcts do not clearly enhance. There is no visible metastatic disease. Vascular: Reported separately. Skull and upper cervical spine: Normal marrow signal. Sinuses/Orbits: No significant sinus disease.  Negative orbits. Other: No significant mastoid pathology.  Unremarkable nasopharynx. IMPRESSION: Small subcentimeter acute RIGHT frontal MCA territory infarcts, nonhemorrhagic. 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 Premature for age atrophy. White matter disease disproportionally affects the brainstem, raising the question of chronic microvascular ischemic change. No abnormal intracranial enhancement of the brain or meninges to suggest metastatic disease. Electronically Signed   By: Staci Righter M.D.   On: 04/20/2019 13:12   Mr Thoracic Spine W Wo Contrast  Result Date: 04/20/2019 CLINICAL DATA:  Abnormal CT chest. Chest pain. Shortness of breath. History of tobacco use. Emphysema. EXAM: MRI THORACIC WITHOUT AND WITH CONTRAST TECHNIQUE: Multiplanar and multiecho pulse sequences of the thoracic spine were obtained without and with intravenous contrast. CONTRAST:  Gadavist 4 mL. COMPARISON:  CT chest 04/19/2019 FINDINGS: MRI THORACIC SPINE FINDINGS Alignment: Kyphotic angulation centered at T4 due to partial collapse due to pathologic fracture. Vertebrae: Metastatic disease involving T3, T4, and T5 related to a large paravertebral mass. Pathologic fracture T4 with retropulsion of 2 mm estimated. Bowing of the posterior wall T5. Suspected metastasis at T12, LEFT hemi vertebra, 12 mm. 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 Epidural tumor extends from mid T3 through the T5-6 disc space. There is abnormal cord signal, maximal at T4, but extending cephalad as  far as the T2-3 disc space. Paraspinal and other soft tissues: Large paravertebral mass on the RIGHT, correlating with the CT abnormality, measuring 69 x 79 x 75 mm. Destruction of the T3, T4, and T5 vertebral bodies and posterior elements. Associated RIGHT-sided rib destruction and invasion of paravertebral musculature. Extension into the posterior mediastinum. Small RIGHT effusion.  Disc levels: No compressive disc herniation or stenosis. IMPRESSION: 7 x 8 cm paravertebral mass centered at T4 on the RIGHT. Invasion of T3, T4, and T5. Pathologic T4 compression fracture. 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 Neurosurgical consultation is warranted. No compressive disc herniation. Suspected additional 12 mm LEFT T12 metastasis, incompletely evaluated. A call has been placed to the ordering provider. Electronically Signed   By: Staci Righter M.D.   On: 04/20/2019 13:26   US Carotid Bilateral  Result Date: 04/20/2019 CLINICAL DATA:  58 year old male with a history of bilateral upper extremity weakness EXAM: BILATERAL CAROTID DUPLEX ULTRASOUND TECHNIQUE: Pearline Cables scale imaging, color Doppler and duplex ultrasound were performed of bilateral carotid and vertebral arteries in the neck. COMPARISON:  None. FINDINGS: Criteria: Quantification of carotid stenosis is based on velocity parameters that correlate the residual internal carotid diameter with NASCET-based stenosis levels, using the diameter of the distal internal carotid lumen as the denominator for stenosis measurement. The following velocity measurements were obtained: RIGHT ICA:  Systolic 85 cm/sec, Diastolic 32 cm/sec CCA:  92 cm/sec SYSTOLIC ICA/CCA RATIO:  0.9 ECA:  79 cm/sec LEFT ICA:  Systolic 87 cm/sec, Diastolic 33 cm/sec CCA:  998 cm/sec SYSTOLIC ICA/CCA RATIO:  0.8 ECA:  85 cm/sec Right Brachial SBP: Not acquired Left Brachial SBP: Not acquired RIGHT CAROTID ARTERY: No significant  calcified disease of the right common carotid artery. Intermediate waveform maintained. Heterogeneous plaque without significant calcifications at the right carotid bifurcation. Low resistance waveform of the right ICA. No significant tortuosity. RIGHT VERTEBRAL ARTERY: Antegrade flow with low resistance waveform. LEFT CAROTID ARTERY: No significant calcified disease of the left common carotid artery. Intermediate waveform maintained. Heterogeneous plaque at the left carotid bifurcation without significant calcifications. Low resistance waveform of the left ICA. LEFT VERTEBRAL ARTERY:  Antegrade flow with low resistance waveform. IMPRESSION: Color duplex indicates minimal heterogeneous plaque, with no hemodynamically significant stenosis by duplex criteria in the extracranial cerebrovascular circulation. Signed, Dulcy Fanny. Dellia Nims, RPVI [facility] Electronically Signed   By: Corrie Mckusick D.O.   On: 04/20/2019 12:37   Ct Biopsy  Result Date: 04/20/2019 INDICATION: Large right lung mass with involvement of the posterior chest wall. Tissue diagnosis is needed. EXAM: CT-GUIDED BIOPSY OF RIGHT LUNG MASS MEDICATIONS: None. ANESTHESIA/SEDATION: Moderate (conscious) sedation was employed during this procedure. A total of Versed 1.0 mg and Fentanyl 50 mcg was administered intravenously. Moderate Sedation Time: 16 minutes. The patient's level of consciousness and vital signs were monitored continuously by radiology nursing throughout the procedure under my direct supervision. FLUOROSCOPY TIME:  None COMPLICATIONS: None immediate. PROCEDURE: Informed written consent was obtained from the patient after a thorough discussion of the procedural risks, benefits and alternatives. All questions were addressed. Maximal Sterile Barrier Technique was utilized including caps, mask, sterile gowns, sterile gloves, sterile drape, hand hygiene and skin antiseptic. A timeout  was performed prior to the initiation of the procedure. Patient was placed prone. CT images through the chest were obtained. The right side of the back was prepped with chlorhexidine and sterile field was created. Skin and soft tissues anesthetized with 1% lidocaine. Using CT guidance, 17 gauge coaxial needle was directed into the lateral aspect of the mass. Needle position was confirmed within the lesion. A total of 3 core biopsies were obtained with an 18 gauge core device. Specimens placed in formalin. 17 gauge coaxial needle was removed without complication. Bandage placed over  the puncture site. FINDINGS: Large mass in the posterior right chest with invasion of the posterior chest wall and ribs. Patient has severe emphysema. Needle position confirmed within the lesion. Negative for pneumothorax following the core biopsies. Three adequate core biopsies were obtained. IMPRESSION: CT-guided core biopsy of the right lung mass. Electronically Signed   By: Markus Daft M.D.   On: 04/20/2019 16:23   Portable Chest Xray  Result Date: 04/21/2019 CLINICAL DATA:  Acute hypoxemic respiratory failure, intubation, metastatic lung cancer EXAM: PORTABLE CHEST 1 VIEW COMPARISON:  Portable exam 0522 hours compared to 04/20/2019 FINDINGS: Tip of endotracheal tube projects 4.4 cm above carina. Nasogastric tube extends into stomach though the proximal side-port is likely above the gastroesophageal junction, recommend advancing tube 4 cm. Normal heart size, mediastinal contours, and pulmonary vascularity. Lungs emphysematous with again identified medial RIGHT upper lobe mass confluent with the mediastinum. No acute infiltrate, pleural effusion or pneumothorax. Bones demineralized. IMPRESSION: Stable exam. Electronically Signed   By: Lavonia Dana M.D.   On: 04/21/2019 08:53   Dg Chest Port 1 View  Result Date: 04/20/2019 CLINICAL DATA:  58 year old male with a history of chest wall mass. EXAM: PORTABLE CHEST 1 VIEW COMPARISON:   04/19/2019, CT 04/20/2019, 04/19/2011 FINDINGS: Cardiomediastinal silhouette unchanged. Opacity/density at the upper right mediastinum is unchanged from the comparison, compatible with malignancy identified on prior CT. No pneumothorax. No confluent airspace disease. No pleural effusion. Destructive changes of the posterior right ribs/vertebral bodies better characterized on prior CT. IMPRESSION: No complicating features status post CT-guided chest wall mass biopsy. Electronically Signed   By: Corrie Mckusick D.O.   On: 04/20/2019 16:59   Dg Chest Portable 1 View  Result Date: 04/19/2019 CLINICAL DATA:  Shortness of breath, generalized weakness for 2 days, COPD, RIGHT-side pain, cough, smoker EXAM: PORTABLE CHEST 1 VIEW COMPARISON:  01/28/2014 FINDINGS: Normal heart size and pulmonary vascularity. Medial RIGHT upper lobe mass 6.8 x 4.8 cm highly worrisome for pulmonary neoplasm. Underlying emphysematous changes consistent with history of COPD. RIGHT apical scarring. No infiltrate, pleural effusion, or pneumothorax. Bones demineralized. IMPRESSION: COPD changes with RIGHT upper lobe scarring. Large medial RIGHT upper lobe mass measuring 6.8 x 4.8 cm highly worrisome for a primary pulmonary neoplasm; further evaluation by CT chest recommended, with contrast if patient's renal function permits. Findings called to Dr. Reather Converse on 04/19/2019 at 1200 hours. Electronically Signed   By: Lavonia Dana M.D.   On: 04/19/2019 12:02   Dg Abd Portable 1v  Result Date: 04/21/2019 CLINICAL DATA:  Encounter for orogastric tube placement. EXAM: PORTABLE ABDOMEN - 1 VIEW COMPARISON:  04/21/2019 at 0527 hours FINDINGS: Single view of the upper abdomen was obtained. Orogastric tube is stable position in the left upper abdomen. The side hole of the tube is at the expected region of the GE junction. Again noted is bowel gas in the mid and left upper abdomen. IMPRESSION: Stable position of the enteric tube. The tip of the tube is in the  proximal stomach body region. Electronically Signed   By: Markus Daft M.D.   On: 04/21/2019 08:36   Dg C-arm 1-60 Min  Result Date: 04/21/2019 CLINICAL DATA:  Five level fusion. EXAM: DG C-ARM 61-120 MIN; THORACIC SPINE 2 VIEWS COMPARISON:  04/20/2019 FINDINGS: Chest x-ray from yesterday upper thoracic posterior rod and pedicle screw fusion beginning at T2 and continuing inferiorly for treatment of extensive thoracic metastatic disease. Unremarkable hardware positioning. IMPRESSION: Fluoroscopy for intraoperative guidance and upper thoracic fusion. Electronically Signed  By: Monte Fantasia M.D.   On: 04/21/2019 04:06   Vas [place] Transcranial Doppler W Bubbles  Result Date: 04/24/2019  Transcranial Doppler with Bubble Indications: Stroke. Performing Technologist: Abram Sander RVS  Examination Guidelines: A complete evaluation includes B-mode imaging, spectral Doppler, color Doppler, and power Doppler as needed of all accessible portions of each vessel. Bilateral testing is considered an integral part of a complete examination. Limited examinations for reoccurring indications may be performed as noted.  Summary:  A vascular evaluation was performed. The left middle cerebral artery was studied. An IV was inserted into the patient's left forearm. Verbal informed consent was obtained.  HITS heard at rest and during valsalva: Approximately 3 HITS heard. PFO Size: Clinically insignificant. Weakly Positive TCD Bubble study indicative of a trivial clinically insignificant right to left shunt *See table(s) above for measurements and observations.  Diagnosing physician: Antony Contras MD Electronically signed by Antony Contras MD on 04/24/2019 at 9:31:35 AM.    Final    Vas [place] Lower Extremity Venous (dvt)  Result Date: 04/21/2019  Lower Venous Study Indications: Stroke.  Comparison Study: no prior Performing Technologist: Abram Sander RVS  Examination Guidelines: A complete evaluation includes B-mode imaging, spectral  Doppler, color Doppler, and power Doppler as needed of all accessible portions of each vessel. Bilateral testing is considered an integral part of a complete examination. Limited examinations for reoccurring indications may be performed as noted.  +---------+---------------+---------+-----------+----------+-------+  RIGHT     Compressibility Phasicity Spontaneity Properties Summary  +---------+---------------+---------+-----------+----------+-------+  CFV       Full            Yes       Yes                             +---------+---------------+---------+-----------+----------+-------+  SFJ       Full                                                      +---------+---------------+---------+-----------+----------+-------+  FV Prox   Full                                                      +---------+---------------+---------+-----------+----------+-------+  FV Mid    Full                                                      +---------+---------------+---------+-----------+----------+-------+  FV Distal Full                                                      +---------+---------------+---------+-----------+----------+-------+  PFV       Full                                                      +---------+---------------+---------+-----------+----------+-------+  POP       Full            Yes       Yes                             +---------+---------------+---------+-----------+----------+-------+  PTV       Full                                                      +---------+---------------+---------+-----------+----------+-------+  PERO      Full                                                      +---------+---------------+---------+-----------+----------+-------+   +---------+---------------+---------+-----------+----------+-------+  LEFT      Compressibility Phasicity Spontaneity Properties Summary  +---------+---------------+---------+-----------+----------+-------+  CFV       Full            Yes       Yes                              +---------+---------------+---------+-----------+----------+-------+  SFJ       Full                                                      +---------+---------------+---------+-----------+----------+-------+  FV Prox   Full                                                      +---------+---------------+---------+-----------+----------+-------+  FV Mid    Full                                                      +---------+---------------+---------+-----------+----------+-------+  FV Distal Full                                                      +---------+---------------+---------+-----------+----------+-------+  PFV       Full                                                      +---------+---------------+---------+-----------+----------+-------+  POP       Full            Yes       Yes                             +---------+---------------+---------+-----------+----------+-------+  PTV       Full                                                      +---------+---------------+---------+-----------+----------+-------+  PERO      Full                                                      +---------+---------------+---------+-----------+----------+-------+     Summary: Right: There is no evidence of deep vein thrombosis in the lower extremity. No cystic structure found in the popliteal fossa. Left: There is no evidence of deep vein thrombosis in the lower extremity. No cystic structure found in the popliteal fossa.  *See table(s) above for measurements and observations. Electronically signed by Curt Jews MD on 04/21/2019 at 3:52:02 PM.    Final       IMPRESSION/PLAN: 91. 58 y.o. 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  Today, we talked to the patient and family about the findings and workup thus far. We discussed the natural history of metastatic  lung carcinoma and general treatment, highlighting the role of radiotherapy in the management. We discussed the available radiation techniques, and focused on the details of logistics and delivery. We discussed the need for PET scan to complete his disease staging and help inform our final treatment recommendations.  His PET scan is scheduled for this Friday, July 17th. Pending PET findings, if his disease appears to be confined to the chest, the recommendation would be to proceed with a 6 1/2 week course of daily radiotherapy to the chest concurrent with chemotherapy.  However, if there is evidence of distant metastasis, we would recommend a 2-week course of adjuvant postoperative radiotherapy directed to the chest/thoracic spine. We reviewed the anticipated acute and late sequelae associated with radiation in this setting. The patient was encouraged to ask questions that were answered to his satisfaction.  At the end of the conversation, the patient is in agreement to proceed with PET imaging as scheduled on 05/12/19 and will follow up with Dr. Delton Coombes for further treatment recommendations on 05/16/19.  He is interested in proceeding with concurrent chemoradiation pending PET results, however he would like to receive radiation treatments closer to his home in [place]. We will make a referral to Dr. Francesca Jewett at [facility] to further discuss treatment recommendations following his PET scan.   Given current concerns for patient exposure during the COVID-19 pandemic, this encounter was conducted via telephone. The patient was notified in advance and was offered a [place] meeting to allow for face to face communication but unfortunately reported that he did not have the appropriate resources/technology to support such a visit and instead preferred to proceed with telephone consult.  The patient has given verbal consent for this type of encounter. The time spent during this encounter was 60 minutes. The  attendants for this meeting include Tyler Pita MD, Ashlyn Bruning PA-C, [place], patient, Paul Thornton his sister, Paul Thornton. During the encounter, Tyler Pita MD, Ashlyn Bruning PA-C, and scribe, Rae Lips were located at [facility].  Patient, Paul Thornton and sister were located at home.   Nicholos Johns, PA-C    Tyler Pita, MD  [facility] Direct Dial: [phone]   Fax: [phone] [email or web address]   Skype   [email or web address]  This document serves as a record of services personally performed by Tyler Pita, MD and Freeman Caldron, PA-C. It was created on their behalf by Rae Lips, a trained medical scribe. The creation of this record is based on the scribe's personal observations and the providers' statements to them. This document has been checked and approved by the attending providers.

## 2019-05-09 NOTE — Progress Notes (Signed)
See progress note under physician encounter. 

## 2019-05-10 ENCOUNTER — Encounter (HOSPITAL_COMMUNITY): Payer: Self-pay | Admitting: Radiation Oncology

## 2019-05-11 ENCOUNTER — Telehealth: Payer: Self-pay | Admitting: *Deleted

## 2019-05-11 ENCOUNTER — Ambulatory Visit (HOSPITAL_COMMUNITY): Payer: Self-pay

## 2019-05-11 NOTE — Telephone Encounter (Signed)
THIS PATIENT WILL HAVE AN APPT. @ [facility] WITH DR. YANAHARIA ON 05/18/19

## 2019-05-12 ENCOUNTER — Encounter: Payer: Medicaid Other | Attending: Physical Medicine & Rehabilitation | Admitting: Physical Medicine & Rehabilitation

## 2019-05-12 ENCOUNTER — Ambulatory Visit (HOSPITAL_COMMUNITY)
Admission: RE | Admit: 2019-05-12 | Discharge: 2019-05-12 | Disposition: A | Discharge status: Home / Self Care | Payer: Medicaid Other | Source: Ambulatory Visit | Attending: Radiation Oncology | Admitting: Radiation Oncology

## 2019-05-12 ENCOUNTER — Encounter: Payer: Self-pay | Admitting: Physical Medicine & Rehabilitation

## 2019-05-12 ENCOUNTER — Other Ambulatory Visit: Payer: Self-pay

## 2019-05-12 VITALS — BP 105/68 | HR 89 | Temp 97.5°F | Ht 69.0 in | Wt 99.0 lb

## 2019-05-12 DIAGNOSIS — C3490 Malignant neoplasm of unspecified part of unspecified bronchus or lung: Secondary | ICD-10-CM | POA: Insufficient documentation

## 2019-05-12 DIAGNOSIS — E871 Hypo-osmolality and hyponatremia: Secondary | ICD-10-CM | POA: Diagnosis present

## 2019-05-12 DIAGNOSIS — K5903 Drug induced constipation: Secondary | ICD-10-CM | POA: Insufficient documentation

## 2019-05-12 DIAGNOSIS — C7951 Secondary malignant neoplasm of bone: Secondary | ICD-10-CM | POA: Diagnosis present

## 2019-05-12 DIAGNOSIS — I639 Cerebral infarction, unspecified: Secondary | ICD-10-CM | POA: Insufficient documentation

## 2019-05-12 DIAGNOSIS — C801 Malignant (primary) neoplasm, unspecified: Secondary | ICD-10-CM | POA: Diagnosis present

## 2019-05-12 DIAGNOSIS — G479 Sleep disorder, unspecified: Secondary | ICD-10-CM | POA: Diagnosis present

## 2019-05-12 DIAGNOSIS — R269 Unspecified abnormalities of gait and mobility: Secondary | ICD-10-CM | POA: Insufficient documentation

## 2019-05-12 LAB — GLUCOSE, CAPILLARY: Glucose-Capillary: 90 mg/dL (ref 70–99)

## 2019-05-12 IMAGING — PT NUCLEAR MEDICINE PET IMAGE INITIAL (PI) SKULL BASE TO THIGH
1 series · 7 of 7 positions shown · non-contrast
Comparison: CTA chest dated 04/19/2019

CLINICAL DATA: Initial treatment strategy for non-small cell lung
cancer.

EXAM:
NUCLEAR MEDICINE PET SKULL BASE TO THIGH
TECHNIQUE: 4.94 mCi F-18 FDG was injected intravenously. Full-ring PET imaging
was performed from the skull base to thigh after the radiotracer. CT
data was obtained and used for attenuation correction and anatomic
localization.
Fasting blood glucose: 90 mg/dl

[Series 1062: results mm oncology reading · 0.9mm · 0.96mm/px · 7 of 7 slices shown]
[im 1/7]
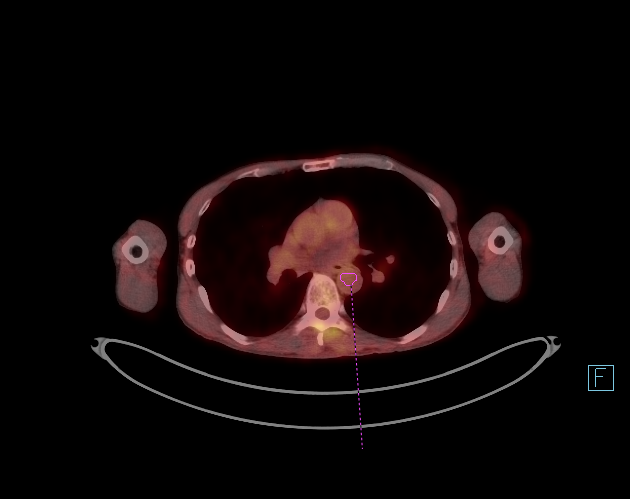
[im 2/7]
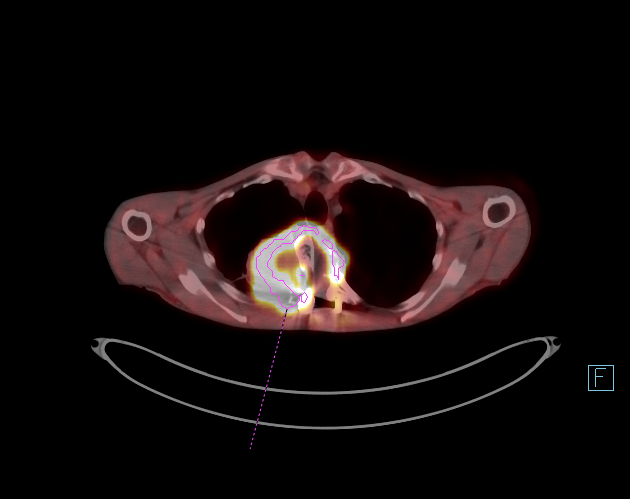
[im 3/7]
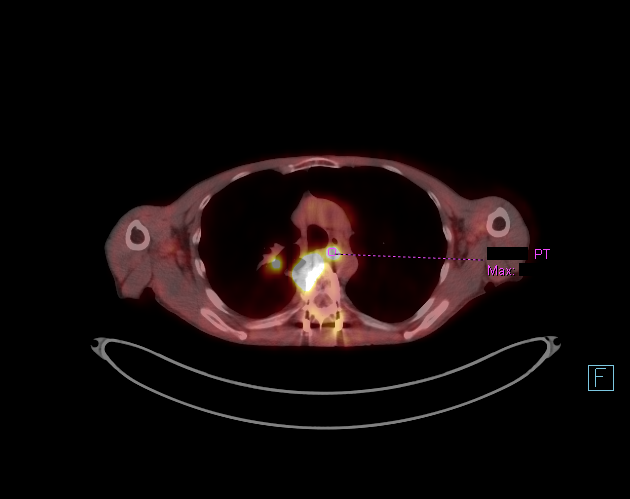
[im 4/7]
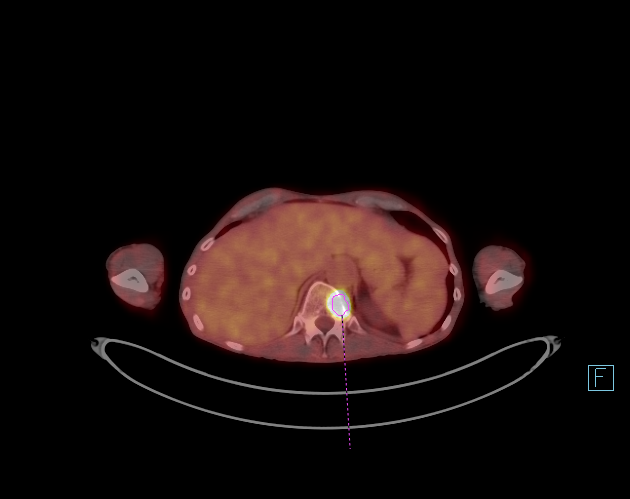
[im 5/7]
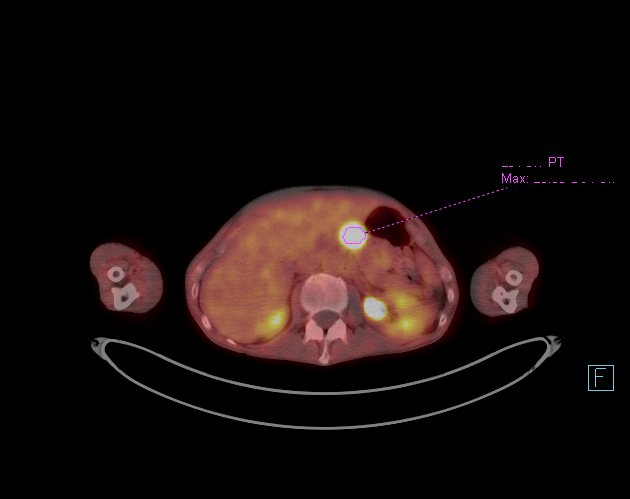
[im 6/7]
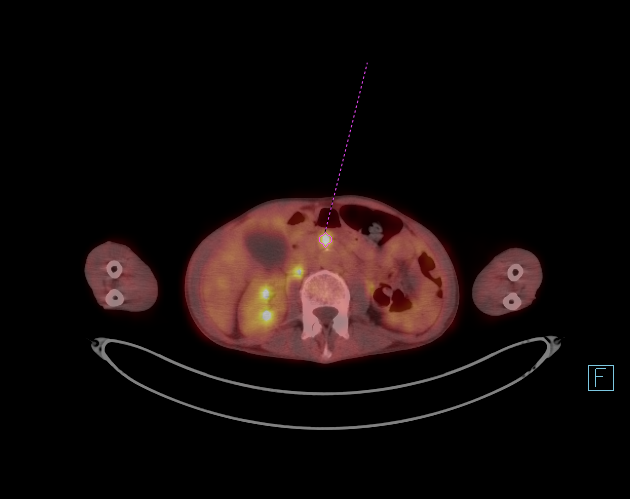
[im 7/7]
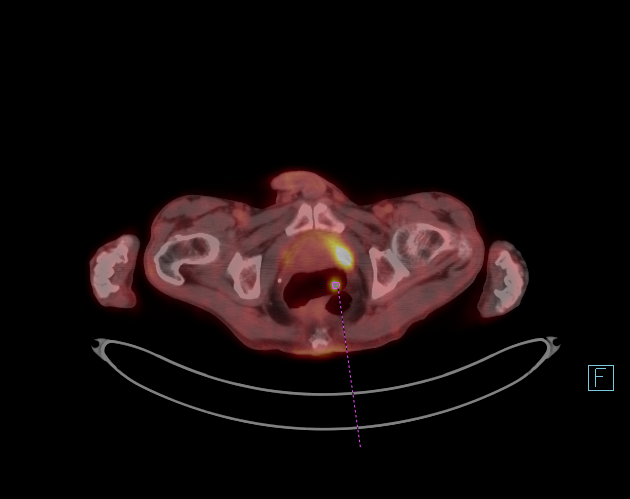

[7 of 7 positions shown; findings below may reference images not displayed]

FINDINGS: Mediastinal blood pool activity: SUV max

Liver activity: SUV max NA

NECK: No hypermetabolic cervical lymphadenopathy.

Incidental CT findings: none

CHEST: [DATE] x 6.4 cm mass in the medial right upper lobe with direct
invasion into the adjacent thoracic spine (T4-T6) and right
posterior chest wall (posteromedial 4th and 5th ribs). Possible mild
destruction at the right 6th costovertebral junction, new.

Mass demonstrates avid hypermetabolism, max SUV 30.6. Tumor likely
invades the spinal canal at the T3-4 level (series 4/image 54),
although this is difficult to confirm on CT given the postoperative
changes.

Additional 8 mm short axis lower paraesophageal node (series 4/image
66), max SUV 14.8.

Incidental CT findings: Extensive centrilobular and paraseptal
emphysematous changes. No pleural effusion or pneumothorax.

ABDOMEN/PELVIS: Focal hypermetabolism along the body/tail of the
pancreas, max SUV 23.5. In retrospect, there is a corresponding
x 1.8 cm hypoenhancing lesion on prior PET. This appearance
statistically favors pancreatic metastasis over a primary lesion.

Additional 9 mm short axis para-aortic node (series 4/image 134),
max SUV 7.1.

No abnormal hypermetabolism in the liver, spleen, or adrenal glands.

7 mm polypoid lesion in the rectum (series 4/image 187), max SUV
8.1. While this may reflect a villous adenoma, malignant polyp is
not excluded.

Incidental CT findings: Moderate colonic stool burden in the right
colon and rectosigmoid colon, favoring constipation.

SKELETON: Tumor involvement of the mid thoracic spine and right
posterior chest wall, as described above. Status post posterior
thoracic fixation with corpectomy and strut graft. The graft is
eccentric to the spine superiorly (series 8/image 19), although
there is no postoperative CT for comparison.

15 mm lytic lesion involving the left T12 vertebral body (series
4/image 108), new, max SUV 29.6.

Incidental CT findings: none
IMPRESSION: 7.9 cm medial right upper lobe mass with direct invasion into the
adjacent thoracic spine, as above.

Associated lower paraesophageal and para-aortic nodal metastases.

Associated hypermetabolic mass in the pancreatic body/tail, favoring
pancreatic metastasis over primary lesion.

Postsurgical changes involving the midthoracic spine, as described
above. Notably, the strut graft is eccentric to the spine
superiorly, although there is no postoperative CT for comparison.
Follow-up evaluation with neurosurgery is suggested.

New lytic metastasis involving the left T12 vertebral body.

7 mm polypoid lesion in the rectum, possibly reflecting a villous
adenoma, malignant polyp not excluded. This finding is of
questionable clinical significance given the additional findings.

## 2019-05-12 MED ORDER — FLUDEOXYGLUCOSE F - 18 (FDG) INJECTION
4.9400 | Freq: Once | INTRAVENOUS | Status: AC | PRN
  Administered 2019-05-12: 07:00:00 4.94 via INTRAVENOUS

## 2019-05-12 NOTE — Progress Notes (Addendum)
Subjective:    Patient ID: Paul Thornton, male    DOB: [birth year], 58 y.o.   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  Admit date: 04/26/2019 Discharge date: 05/06/2019  At discharge, he was instructed to follow up on lab work, which he has not had. He has an appointment with Neurosurg and Heme/Onc. Pain has been present but tolerable. BP relatively controlled.  Bowel movements are improving. Has difficulty falling asleep. He sees PCP next week.   Therapies: 2/week DME: Shower chair Mobility: Walker most of the time.  Pain Inventory Average Pain 7 Pain Right Now 7 My pain is sharp and burning  In the last 24 hours, has pain interfered with the following? General activity 4 Relation with others 0 Enjoyment of life 5 What TIME of day is your pain at its worst? daytime Sleep (in general) Poor  Pain is worse with: walking Pain improves with: rest and medication Relief from Meds: 2  Mobility use a walker use a wheelchair needs help with transfers  Function I need assistance with the following:  bathing, household duties and shopping  Neuro/Psych anxiety  Prior Studies Any changes since last visit?  no  Physicians involved in your care Any changes since last visit?  no   Family History  Problem Relation Age of Onset  . High blood pressure Mother   . Lung cancer Father   . Rectal cancer Maternal Grandmother   . Lung cancer Paternal Grandfather    Social History   Socioeconomic History  . Marital status: Single    Spouse name: Not on file  . Number of children: 0  . Years of education: Not on file  . Highest education level: Not on file  Occupational History  . Not on file  Social Needs  . Financial resource strain: Not on file  . Food insecurity    Worry: Not on  file    Inability: Not on file  . Transportation needs    Medical: Not on file    Non-medical: Not on file  Tobacco Use  . Smoking status: Former Smoker    Packs/day: 0.25    Years: 40.00    Pack years: 10.00    Types: Cigarettes    Quit date: 04/19/2019    Years since quitting: 0.0  . Smokeless tobacco: Never Used  Substance and Sexual Activity  . Alcohol use: No  . Drug use: No  . Sexual activity: Not Currently  Lifestyle  . Physical activity    Days per week: Not on file    Minutes per session: Not on file  . Stress: Not on file  Relationships  . Social Herbalist on phone: Not on file    Gets together: Not on file    Attends religious service: Not on file    Active member of club or organization: Not on file    Attends meetings of clubs or organizations: Not on file    Relationship status: Not on file  Other Topics Concern  . Not on file  Social History Narrative   Single. No children. Sister, [name], ensures the patient gets to his appointments.    Past Surgical History:  Procedure Laterality Date  . POSTERIOR LUMBAR FUSION 4 LEVEL N/A 04/20/2019   Procedure: POSTERIOR THORACIC FUSION 5 LEVEL;  Surgeon: Kristeen Miss, MD;  Location: [facility];  Service: Neurosurgery;  Laterality: N/A;  POSTERIOR THORACIC FUSION 5 LEVEL   Past Medical History:  Diagnosis Date  . Bronchitis   . Cancer ([place])   . COPD (chronic obstructive pulmonary disease) ([place])   . GERD (gastroesophageal reflux disease)   . Stroke ([facility])    BP 105/68   Pulse 89   Temp (!) 97.5 F (36.4 C)   Ht 5\' 9"  (1.753 m) Comment: reported  Wt 99 lb (44.9 kg) Comment: reported  SpO2 94%   BMI 14.62 kg/m   Opioid Risk Score:   Fall Risk Score:  `1  Depression screen PHQ 2/9  No flowsheet data found.  Review of Systems  Constitutional: Negative.   HENT: Negative.   Eyes: Negative.   Respiratory: Negative.   Cardiovascular: Negative.   Gastrointestinal: Negative.   Endocrine: Negative.    Genitourinary: Negative.   Musculoskeletal: Negative.   Skin: Negative.   Allergic/Immunologic: Negative.   Neurological: Negative.   Hematological: Negative.   Psychiatric/Behavioral: The patient is nervous/anxious.   All other systems reviewed and are negative.      Objective:   Physical Exam Constitutional: No distress . Vital signs reviewed.  Cachectic. HENT: Normocephalic. Atraumatic. Eyes: EOMI. No discharge. Cardiovascular: No JVD. Respiratory: Normal effort. GI: Non-distended. Musc: No edema or tenderness in extremities. Neurological: Alert and oriented. Motor: Bilateral upper extremities: 5/5 proximal distal Right lower extremity: 4+/5 proximal and distal  Left lower extremity: 4+/5 proximal to distal Skin: Warm and dry.  Psychiatric: He has a normal mood and affect. His behavior is normal.     Assessment & Plan:  58 year old male with history of COPD, tobacco abuse presents for transitional care management after receiving CIR for right MCA infarct superimposed on lung CA with mets to spine s/p decompression on 04/21/2019.  1.  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  Cont therapies  Follow up with Neurosurg/Heme/Onc  Cont back brace  2. Pain Management:    Cont hydrocodone  3. Lung cancer with bony mets:   Cont follow up heme/Onc recs.  4.  Bowels:   Improving with meds  Cont MiraLAX twice daily   5. Sleep disturbance  Trial Melatonin  6. Hyponatremia  PCP appointment next week, pt states lab work usually drawn, reminded follow  7. Gait abnormality  Cont walker for safety  Cont therapies

## 2019-05-15 ENCOUNTER — Ambulatory Visit (HOSPITAL_COMMUNITY): Payer: Self-pay | Admitting: Hematology

## 2019-05-16 ENCOUNTER — Encounter (HOSPITAL_COMMUNITY): Payer: Self-pay | Admitting: Hematology

## 2019-05-16 ENCOUNTER — Inpatient Hospital Stay (HOSPITAL_COMMUNITY): Payer: Medicaid Other | Attending: Hematology | Admitting: Hematology

## 2019-05-16 ENCOUNTER — Other Ambulatory Visit: Payer: Self-pay

## 2019-05-16 DIAGNOSIS — C7951 Secondary malignant neoplasm of bone: Secondary | ICD-10-CM | POA: Insufficient documentation

## 2019-05-16 DIAGNOSIS — Z79899 Other long term (current) drug therapy: Secondary | ICD-10-CM | POA: Insufficient documentation

## 2019-05-16 DIAGNOSIS — Z7189 Other specified counseling: Secondary | ICD-10-CM | POA: Insufficient documentation

## 2019-05-16 DIAGNOSIS — Z8673 Personal history of transient ischemic attack (TIA), and cerebral infarction without residual deficits: Secondary | ICD-10-CM | POA: Diagnosis not present

## 2019-05-16 DIAGNOSIS — K5900 Constipation, unspecified: Secondary | ICD-10-CM | POA: Diagnosis not present

## 2019-05-16 DIAGNOSIS — M549 Dorsalgia, unspecified: Secondary | ICD-10-CM | POA: Insufficient documentation

## 2019-05-16 DIAGNOSIS — R634 Abnormal weight loss: Secondary | ICD-10-CM | POA: Insufficient documentation

## 2019-05-16 DIAGNOSIS — Z87891 Personal history of nicotine dependence: Secondary | ICD-10-CM | POA: Insufficient documentation

## 2019-05-16 DIAGNOSIS — Z7982 Long term (current) use of aspirin: Secondary | ICD-10-CM

## 2019-05-16 DIAGNOSIS — C801 Malignant (primary) neoplasm, unspecified: Secondary | ICD-10-CM

## 2019-05-16 DIAGNOSIS — C3411 Malignant neoplasm of upper lobe, right bronchus or lung: Secondary | ICD-10-CM | POA: Diagnosis present

## 2019-05-16 DIAGNOSIS — J449 Chronic obstructive pulmonary disease, unspecified: Secondary | ICD-10-CM | POA: Insufficient documentation

## 2019-05-16 DIAGNOSIS — K219 Gastro-esophageal reflux disease without esophagitis: Secondary | ICD-10-CM | POA: Diagnosis not present

## 2019-05-16 DIAGNOSIS — C3490 Malignant neoplasm of unspecified part of unspecified bronchus or lung: Secondary | ICD-10-CM | POA: Insufficient documentation

## 2019-05-16 DIAGNOSIS — R079 Chest pain, unspecified: Secondary | ICD-10-CM | POA: Diagnosis not present

## 2019-05-16 DIAGNOSIS — C3491 Malignant neoplasm of unspecified part of right bronchus or lung: Secondary | ICD-10-CM

## 2019-05-16 MED ORDER — FOLIC ACID 1 MG PO TABS: 1.0000 mg | ORAL_TABLET | Freq: Every day | ORAL | 4 refills | Status: AC

## 2019-05-16 MED ORDER — CYANOCOBALAMIN 1000 MCG/ML IJ SOLN
INTRAMUSCULAR | Status: AC
  Filled 2019-05-16: qty 1

## 2019-05-16 MED ORDER — CYANOCOBALAMIN 1000 MCG/ML IJ SOLN
1000.0000 ug | Freq: Once | INTRAMUSCULAR | Status: AC
  Administered 2019-05-16: 1000 ug via INTRAMUSCULAR

## 2019-05-16 NOTE — Progress Notes (Signed)
[place], [place] [address]   CLINIC:  Medical Oncology/Hematology  PCP:  Patient, No Pcp Per No address on file None   REASON FOR VISIT:  Follow-up for metastatic lung cancer.  CURRENT THERAPY: Combination chemoimmunotherapy to be initiated.  BRIEF ONCOLOGIC HISTORY:  Oncology History  Metastatic lung cancer (metastasis from lung to other site) ([place])  05/16/2019 Initial Diagnosis   Metastatic lung cancer (metastasis from lung to other site) [facility])   05/16/2019 Cancer Staging   Staging form: Lung, AJCC 8th Edition - Clinical stage from 05/16/2019: Stage IVB (cT4, cN3, pM1c) - Signed by Derek Jack, MD on 05/16/2019      CANCER STAGING: Cancer Staging Metastatic lung cancer (metastasis from lung to other site) [facility]) Staging form: Lung, AJCC 8th Edition - Clinical stage from 05/16/2019: Stage IVB (cT4, cN3, pM1c) - Signed by Derek Jack, MD on 05/16/2019  Primary cancer of right upper lobe of lung [facility]) Staging form: Lung, AJCC 8th Edition - Clinical stage from 05/09/2019: Stage IIIC (cT4, cN3, cM0) - Unsigned    INTERVAL HISTORY:  Paul Thornton 57 y.o. male is a 58 year old very pleasant white male who is seen in follow-up visit for metastatic non-small cell lung cancer, adenocarcinoma type.  He was initially admitted to our hospital with worsening shortness of breath and chest pain.  He was found to have right lung mass.  However he was also found to have lower extremity weakness.  Thoracic MRI showed cord compression.  He was transferred to [facility] where he underwent laminectomy and decompression of the T3-T5.  He was then transferred to rehab facility and is currently discharged home.  He lives at home with his mother.  His sister is helping with appointments.  He reports that he is able to walk with help of walker.  He is able to do all his ADLs at home.  He had a long history of smoking cigarettes.  Family history is  positive for lung cancer.  Overall he lost about 20 pounds in the last 2 months.  He is drinking about 2 boost per day.  He reports that he is gaining back some weight.  He has some pain in the back which improved after surgery.  He is currently requiring Norco 7.5 mg 3 tablets/day.  He also reports pain in the right anterior chest wall in the midclavicular line for the past few months. Appetite is reported 75% and energy levels are 75%.   REVIEW OF SYSTEMS:  Review of Systems  Cardiovascular: Positive for chest pain.  Gastrointestinal: Positive for constipation.  Musculoskeletal: Positive for back pain.  All other systems reviewed and are negative.    PAST MEDICAL/SURGICAL HISTORY:  Past Medical History:  Diagnosis Date  . Bronchitis   . Cancer ([place])   . COPD (chronic obstructive pulmonary disease) ([place])   . GERD (gastroesophageal reflux disease)   . Stroke [facility])    Past Surgical History:  Procedure Laterality Date  . POSTERIOR LUMBAR FUSION 4 LEVEL N/A 04/20/2019   Procedure: POSTERIOR THORACIC FUSION 5 LEVEL;  Surgeon: Kristeen Miss, MD;  Location: [facility];  Service: Neurosurgery;  Laterality: N/A;  POSTERIOR THORACIC FUSION 5 LEVEL     SOCIAL HISTORY:  Social History   Socioeconomic History  . Marital status: Single    Spouse name: Not on file  . Number of children: 0  . Years of education: Not on file  . Highest education level: Not on file  Occupational  History  . Not on file  Social Needs  . Financial resource strain: Not hard at all  . Food insecurity    Worry: Never true    Inability: Never true  . Transportation needs    Medical: No    Non-medical: No  Tobacco Use  . Smoking status: Former Smoker    Packs/day: 0.25    Years: 40.00    Pack years: 10.00    Types: Cigarettes    Quit date: 04/19/2019    Years since quitting: 0.0  . Smokeless tobacco: Never Used  Substance and Sexual Activity  . Alcohol use: No  . Drug use: No  . Sexual activity: Not  Currently  Lifestyle  . Physical activity    Days per week: 0 days    Minutes per session: 0 min  . Stress: To some extent  Relationships  . Social Herbalist on phone: Three times a week    Gets together: Twice a week    Attends religious service: 1 to 4 times per year    Active member of club or organization: No    Attends meetings of clubs or organizations: Never    Relationship status: Never married  . Intimate partner violence    Fear of current or ex partner: No    Emotionally abused: No    Physically abused: No    Forced sexual activity: No  Other Topics Concern  . Not on file  Social History Narrative   Single. No children. Sister, [name], ensures the patient gets to his appointments.     FAMILY HISTORY:  Family History  Problem Relation Age of Onset  . High blood pressure Mother   . Lung cancer Father   . Rectal cancer Maternal Grandmother   . Lung cancer Paternal Grandfather     CURRENT MEDICATIONS:  Outpatient Encounter Medications as of 05/16/2019  Medication Sig Note  . albuterol (VENTOLIN HFA) 108 (90 Base) MCG/ACT inhaler Inhale 1-2 puffs into the lungs every 6 (six) hours as needed for wheezing or shortness of breath.   . ALPRAZolam (XANAX) 0.25 MG tablet Take 1 tablet (0.25 mg total) by mouth 3 (three) times daily as needed for anxiety. 05/12/2019: Fill date 05/06/19 #60  did not bring bottle  . aspirin EC 325 MG EC tablet Take 1 tablet (325 mg total) by mouth daily.   . Budesonide 90 MCG/ACT inhaler Inhale 2 puffs into the lungs 2 (two) times daily.   . famotidine (PEPCID) 20 MG tablet Take 1 tablet (20 mg total) by mouth 2 (two) times daily.   Marland Kitchen HYDROcodone-acetaminophen (NORCO) 7.5-325 MG tablet Take 1 tablet by mouth every 4 (four) hours as needed for severe pain. 05/12/2019: Fill date 05/06/19 #120  did not bring bottle  . nicotine (NICODERM CQ - DOSED IN MG/24 HOURS) 14 mg/24hr patch Place 1 patch (14 mg total) onto the skin daily.   . polyvinyl  alcohol (LIQUIFILM TEARS) 1.4 % ophthalmic solution Place 1 drop into both eyes as needed for dry eyes.   Marland Kitchen zolpidem (AMBIEN) 5 MG tablet Take 1 tablet (5 mg total) by mouth at bedtime as needed for sleep. 05/12/2019: Fill date 05/06/19 #15 did not bring bottle  . folic acid (FOLVITE) 1 MG tablet Take 1 tablet (1 mg total) by mouth daily.   . [EXPIRED] cyanocobalamin ((VITAMIN B-12)) injection 1,000 mcg     No facility-administered encounter medications on file as of 05/16/2019.     ALLERGIES:  Allergies  Allergen Reactions  . Amoxicillin Hives    All "cillins"  . Penicillins Hives    Did it involve swelling of the face/tongue/throat, SOB, or low BP? No Did it involve sudden or severe rash/hives, skin peeling, or any reaction on the inside of your mouth or nose? No Did you need to seek medical attention at a hospital or doctor's office? No When did it last happen? If all above answers are "NO", may proceed with cephalosporin use.     PHYSICAL EXAM:  ECOG Performance status: 2  Vitals:   05/16/19 1338  BP: 93/65  Pulse: 87  Resp: 18  Temp: 97.7 F (36.5 C)  SpO2: 97%   Filed Weights   05/16/19 1338  Weight: 101 lb 1.6 oz (45.9 kg)    Physical Exam Vitals signs reviewed.  Constitutional:      Appearance: Normal appearance.  Cardiovascular:     Rate and Rhythm: Normal rate and regular rhythm.     Heart sounds: Normal heart sounds.  Pulmonary:     Effort: Pulmonary effort is normal.     Breath sounds: Normal breath sounds.  Abdominal:     General: There is no distension.     Palpations: Abdomen is soft. There is no mass.  Musculoskeletal:        General: No swelling.  Skin:    General: Skin is warm.  Neurological:     General: No focal deficit present.     Mental Status: He is alert and oriented to person, place, and time.  Psychiatric:        Mood and Affect: Mood normal.        Behavior: Behavior normal.      LABORATORY DATA:  I have reviewed the  labs as listed.  CBC    Component Value Date/Time   WBC 9.0 05/03/2019 0702   RBC 3.60 (L) 05/03/2019 0702   HGB 10.8 (L) 05/03/2019 0702   HCT 33.0 (L) 05/03/2019 0702   PLT 520 (H) 05/03/2019 0702   MCV 91.7 05/03/2019 0702   MCH 30.0 05/03/2019 0702   MCHC 32.7 05/03/2019 0702   RDW 15.5 05/03/2019 0702   LYMPHSABS 2.1 05/01/2019 1200   MONOABS 1.3 (H) 05/01/2019 1200   EOSABS 0.1 05/01/2019 1200   BASOSABS 0.0 05/01/2019 1200   CMP Latest Ref Rng & Units 05/03/2019 05/01/2019 04/28/2019  Glucose 70 - 99 mg/dL 106(H) 101(H) -  BUN 6 - 20 mg/dL 10 17 -  Creatinine 0.61 - 1.24 mg/dL 0.59(L) 0.65 -  Sodium 135 - 145 mmol/L 132(L) 130(L) -  Potassium 3.5 - 5.1 mmol/L 4.2 4.1 4.1  Chloride 98 - 111 mmol/L 96(L) 93(L) -  CO2 22 - 32 mmol/L 27 29 -  Calcium 8.9 - 10.3 mg/dL 8.9 8.8(L) -  Total Protein 6.5 - 8.1 g/dL - - -  Total Bilirubin 0.3 - 1.2 mg/dL - - -  Alkaline Phos 38 - 126 U/L - - -  AST 15 - 41 U/L - - -  ALT 0 - 44 U/L - - -       DIAGNOSTIC IMAGING:  I have independently reviewed the scans and discussed with the patient.   ASSESSMENT & PLAN:   Metastatic lung cancer (metastasis from lung to other site) ([place]) 1.  Metastatic right lung adenocarcinoma: -PDL 1 TPS 70%, foundation 1 with no targetable mutations. -Laminectomy and decompression of the thoracic spinal canal from T3-T5 with partial greater than 70% vertebrectomies of T3 and  T4.  Reconstruction with iliac crest allograft strut and fixation with pedicle screws from T2-T6. 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 -Brain MRI negative for metastasis.  He is currently ambulating with the help of  a walker and is able to do all ADLs. - We talked about the normal prognosis of stage IV lung cancer.  Because of his high tumor burden, I have recommended initiation with chemoimmunotherapy with carboplatin, pemetrexed and pembrolizumab for 2-4 cycles followed by pembrolizumab. - We talked about side effects in detail.  He will receive B12 injection today.  We have sent a prescription for folic acid. -He has an appointment to see Dr.Yanagihara later this week. -I have recommended port placement.  I will make referral to Dr. Arnoldo Morale.  2.  Back pain: -His back pain is better since surgery.  However he has right chest wall pain in the midclavicular line which has become more prominent lately. -He is taking Norco 7.5 mg up to 3 tablets/day.  3.  Weight loss/nutrition: -He reports 20 pound weight loss in the last 2 months. -His appetite has picked up since his discharge home.  He is drinking about 2 cans of boost per day.  He gained few pounds back. -We will make a referral for nutrition consult.   Total time spent is 40 minutes with more than 50% of the time spent face-to-face discussing scan results, biopsy results, treatment plan, counseling and coordination of care.    Orders placed this encounter:  No orders of the defined types were placed in this encounter.     Derek Jack, MD [place] [phone]

## 2019-05-16 NOTE — Assessment & Plan Note (Addendum)
1.  Metastatic right lung adenocarcinoma: -PDL 1 TPS 70%, foundation 1 with no targetable mutations. -Laminectomy and decompression of the thoracic spinal canal from T3-T5 with partial greater than 70% vertebrectomies of T3 and T4.  Reconstruction with iliac crest allograft strut and fixation with pedicle screws from T2-T6. 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 -Brain MRI negative for metastasis.  He is currently ambulating with the help of a walker and is able to do all ADLs. - We talked about the normal prognosis of stage IV lung cancer.  Because of his high tumor burden, I have recommended initiation with chemoimmunotherapy with carboplatin, pemetrexed and pembrolizumab for 2-4 cycles followed by pembrolizumab. - We talked about side effects in detail.  He will receive B12 injection today.  We have sent a prescription for folic acid. -He has an appointment to see Dr.Yanagihara later this week. -I have recommended port placement.  I will make referral to Dr. Arnoldo Morale.  2.  Back pain: -His back pain is better since surgery.  However he has right chest wall pain in the midclavicular line which has become more prominent lately. -He is taking Norco 7.5 mg up to 3 tablets/day.  3.  Weight loss/nutrition: -He reports 20 pound weight loss in the last 2 months. -His appetite has picked up since his discharge home.  He is drinking about 2 cans of boost per day.  He gained few pounds back. -We will make a referral for nutrition consult.  4.  Bone metastasis: -We will also consider adding denosumab to decrease skeletal related events.

## 2019-05-16 NOTE — Assessment & Plan Note (Deleted)
1.  Metastatic right lung adenocarcinoma: -PDL 1 TPS 70%, foundation 1 with no targetable mutations. -Laminectomy and decompression of the thoracic spinal canal from T3-T5 with partial greater than 70% vertebrectomies of T3 and T4.  Reconstruction with iliac crest allograft strut and fixation with pedicle screws from T2-T6. 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 -Brain MRI negative for metastasis.

## 2019-05-16 NOTE — Patient Instructions (Signed)
[place] at [facility] Discharge Instructions  You were seen today by Dr. Delton Coombes. He went over your history, family history and how you've been feeling lately. You have metastatic lung cancer. Your cancer can be treated but not cured. He would like to start you on combination treatment with chemotherapy and immunotherapy. He will send you for radiation in [place] with Dr. Darreld Mclean. He will get you scheduled for port placement. He will see you back on Day 1 of treatment for labs and follow up.   Thank you for choosing [place] at [facility] to provide your oncology and hematology care.  To afford each patient quality time with our provider, please arrive at least 15 minutes before your scheduled appointment time.   If you have a lab appointment with the [facility] please come in thru the  Main Entrance and check in at the main information desk  You need to re-schedule your appointment should you arrive 10 or more minutes late.  We strive to give you quality time with our providers, and arriving late affects you and other patients whose appointments are after yours.  Also, if you no show three or more times for appointments you may be dismissed from the clinic at the providers discretion.     Again, thank you for choosing [facility].  Our hope is that these requests will decrease the amount of time that you wait before being seen by our physicians.       _____________________________________________________________  Should you have questions after your visit to [facility], please contact our office at [phone] between the hours of 8:00 a.m. and 4:30 p.m.  Voicemails left after 4:00 p.m. will not be returned until the following business day.  For prescription refill requests, have your pharmacy contact our office and allow 72 hours.    Cancer Center Support Programs:   > Cancer Support Group  2nd Tuesday of the month  1pm-2pm, Journey Room

## 2019-05-16 NOTE — Patient Instructions (Signed)
[facility] Chemotherapy Teaching    You have been diagnosed with metastatic (Stage IV) lung cancer.  You will be treated every three weeks with a combination of chemotherapy and immunotherapy medications.  The medications you will receive are:  Pemetrexed (Alimta); carboplatin; and pembrolizumab (Keytruda).  The intent of this treatment is palliative, meaning it is meant to control your cancer (keep it from growing/spreading further), and to help alleviate any symptoms you may be having related to your cancer.   You will see the doctor regularly throughout treatment.  We monitor your lab work prior to every treatment. The doctor monitors your response to treatment by the way you are feeling, your blood work, and scans periodically.  There will be wait times while you are here for treatment.  It will take about 30 minutes to 1 hour for your lab work to result.  Then there will be wait times while pharmacy mixes your medications.   You must take folic acid every day by mouth beginning 7 days before your first dose of alimta.  You must keep taking folic acid every day during the time you are being treated with alimta, and for every day for 21 days after you receive your last alimta dose.    You will get B12 injections while you are on alimta.  The first one about 1 week prior to starting treatment and then about every 9 weeks during treatment.    Medications you will receive in the clinic prior to your chemotherapy medications:  Aloxi:  ALOXI is a medicine called an "antiemetic."   ALOXI is used in adults to help prevent the  nausea and vomiting that happens with certain anti-cancer medicines (chemotherapy).  Aloxi is a long acting medication, and will remain in your system for 24-36 hours.   Emend:  This is an anti-nausea medication that is used with Aloxi to help prevent nausea and vomiting caused by chemotherapy.  Dexamethasone:  This is a steroid given prior to chemotherapy to help  prevent allergic reactions; it may also help prevent and control nausea and diarrhea.    Carboplatin (Generic Name) Other Names: Paraplatin, CBDCA  About This Drug Carboplatin is a drug used to treat cancer. This drug is given in the vein (IV).  Takes 30 minutes to infuse.   Possible Side Effects (More Common) . Nausea and throwing up (vomiting). These symptoms may happen within a few hours after your treatment and may last up to 24 hours. Medicines are available to stop or lessen these side effects. . Bone marrow depression. This is a decrease in the number of white blood cells, red blood cells, and platelets. This may raise your risk of infection, make you tired and weak (fatigue), and raise your risk of bleeding. . Soreness of the mouth and throat. You may have red areas, white patches, or sores that hurt. . This drug may affect how your kidneys work. Your kidney function will be checked as needed. . Electrolyte changes. Your blood will be checked for electrolyte changes as needed.  Possible Side Effects (Less Common) . Hair loss. Some patients lose their hair on the scalp and body. You may notice your hair thinning seven to 14 days after getting this drug. . Effects on the nerves are called peripheral neuropathy. You may feel numbness, tingling, or pain in your hands and feet. It may be hard for you to button your clothes, open jars, or walk as usual. The effect on the nerves may  get worse with more doses of the drug. These effects get better in some people after the drug is stopped but it does not get better in all people. . Loose bowel movements (diarrhea) that may last for several days . Decreased hearing or ringing in the ears . Changes in the way food and drinks taste . Changes in liver function. Your liver function will be checked as needed  Allergic Reactions Serious allergic reactions including anaphylaxis are rare. While you are getting this drug in your vein (IV), tell your  nurse right away if you have any of these symptoms of an allergic reaction: . Trouble catching your breath . Feeling like your tongue or throat are swelling . Feeling your heart beat quickly or in a not normal way (palpitations) . Feeling dizzy or lightheaded . Flushing, itching, rash, and/or hives  Treating Side Effects . Drink 6-8 cups of fluids each day unless your doctor has told you to limit your fluid intake due to some other health problem. A cup is 8 ounces of fluid. If you throw up or have loose bowel movements, you should drink more fluids so that you do not become dehydrated (lack water in the body from losing too much fluid). . Mouth care is very important. Your mouth care should consist of routine, gentle cleaning of your teeth or dentures and rinsing your mouth with a mixture of 1/2 teaspoon of salt in 8 ounces of water or  teaspoon of baking soda in 8 ounces of water. This should be done at least after each meal and at bedtime. . If you have mouth sores, avoid mouthwash that has alcohol. Avoid alcohol and smoking because they can bother your mouth and throat. . If you have numbness and tingling in your hands and feet, be careful when cooking, walking, and handling sharp objects and hot liquids. . Talk with your nurse about getting a wig before you lose your hair. Also, call the [place] at 800-ACS-2345 to find out information about the "Look Good, Feel Better" program close to where you live. It is a free program where women getting chemotherapy can learn about wigs, turbans and scarves as well as makeup techniques and skin and nail care.  Food and Drug Interactions There are no known interactions of carboplatin with food. This drug may interact with other medicines. Tell your doctor and pharmacist about all the medicines and dietary supplements (vitamins, minerals, herbs and others) that you are taking at this time. The safety and use of dietary supplements and  alternative diets are often not known. Using these might affect your cancer or interfere with your treatment. Until more is known, you should not use dietary supplements or alternative diets without your cancer doctor's help.  When to Call the Doctor Call your doctor or nurse right away if you have any of these symptoms: . Fever of 100.5 F (38 C) or above; chills . Bleeding or bruising that is not normal . Wheezing or trouble breathing . Nausea that stops you from eating or drinking . Throwing up more than once a day . Rash or itching . Loose bowel movements (diarrhea) more than four times a day or diarrhea with weakness or feeling lightheaded . Call your doctor or nurse as soon as possible if any of these symptoms happen: . Numbness, tingling, decreased feeling or weakness in fingers, toes, arms, or legs . Change in hearing, ringing in the ears . Blurred vision or other changes in eyesight .  Decreased urine . Yellowing of skin or eyes  Sexual Problems and Reproductive Concerns Sexual problems and reproduction concerns may happen. In both men and women, this drug may affect your ability to have children. This cannot be determined before your treatment. Talk with your doctor or nurse if you plan to have children. Ask for information on sperm or egg banking. In men, this drug may interfere with your ability to make sperm, but it should not change your ability to have sexual relations. In women, menstrual bleeding may become irregular or stop while you are getting this drug. Do not assume that you cannot become pregnant if you do not have a menstrual period. Women may go through signs of menopause (change of life) like vaginal dryness or itching. Vaginal lubricants can be used to lessen vaginal dryness, itching, and pain during sexual relations. Genetic counseling is available for you to talk about the effects of this drug therapy on future pregnancies. Also, a genetic counselor can look at the  possible risk of problems in the unborn baby due to this medicine if an exposure happens during pregnancy. . Pregnancy warning: This drug may have harmful effects on the unborn child, so effective methods of birth control should be used during your cancer treatment. . Breast feeding warning: It is not known if this drug passes into breast milk. For this reason, women should talk to their doctor about the risks and benefits of breast feeding during treatment with this drug because this drug may enter the breast milk and badly harm a breast feeding baby.   Pemetrexed (Generic Name) Other Names: ALIMTA  About This Drug Pemetrexed is used to treat cancer. It is given in the vein (IV).  Takes 10 minutes to infuse.   Possible Side Effects (More Common) . Bone marrow depression. This is a decrease in the number of white blood cells, red blood cells, and platelets. This may raise your risk of infection, make you tired and weak (fatigue), and raise your risk of bleeding. . Fatigue . Soreness of the mouth and throat. You may have red areas, white patches, or sores that hurt.  Possible Side Effects (less common) . Trouble breathing or feeling short of breath . Nausea and throwing up (vomiting) . Skin rash  Treating Side Effects . Ask your doctor or nurse about medicine that is available to help stop or lessen nausea and throwing up. . If you get a rash, do not put anything on it unless your doctor or nurse says you may. Keep the area around the rash clean and dry. . Mouth care is very important. Your mouth care should consist of routine, gentle cleaning of your teeth or dentures and rinsing your mouth with a mixture of 1/2 teaspoon of salt in 8 ounces of water or  teaspoon of baking soda in 8 ounces of water. This should be done at least after each meal and at bedtime. . If you have mouth sores, avoid mouthwash that has alcohol. Avoid alcohol and smoking because they can bother your mouth and  throat.  Food and Drug Interactions There are no known interactions of this medicine with food. Tell your doctor if you are taking ibuprofen. Pemetrexed may interact with other medicines. Tell your doctor and pharmacist about all the medicines and dietary supplements (vitamins, minerals, herbs and others) that you are taking at this time. The safety and use of dietary supplements and alternative diets are often not known. Using these might affect your cancer or  interfere with your treatment. Until more is known, you should not use dietary supplements or alternative diets without your cancer doctor's help.  When to Call the Doctor Call your doctor or nurse right away if you have any of these symptoms: . Temperature of 100.5 F (38 C) or above . Chills . Easy bruising or bleeding . Trouble breathing . Chest pain . Nausea that stops you from eating or drinking . Throwing up more than 3 times in a day Call your doctor or nurse as soon as possible if you have any of these symptoms: . Rash that does not go away with prescribed medicine . Nausea or vomiting that does not go away with prescribed medicine . Extreme fatigue that interferes with normal activities  Reproduction Concerns . Pregnancy warning: This drug may have harmful effects on the unborn child, so effective methods of birth control should be used during your cancer treatment. . Genetic counseling is available for you to talk about the effects of this drug therapy on future pregnancies. Also, a genetic counselor can look at the possible risk of problems in the unborn baby due to this medicine if an exposure happens during pregnancy. . Breast feeding warning: It is not known if this drug passes into breast milk. For this reason, women should talk to their doctor about the risks and benefits of breast feeding during treatment with this drug because this drug may enter the breast milk and badly harm a breast feeding baby.  Pembrolizumab  Beryle Flock)  About This Drug Pembrolizumab is used to treat cancer. It is given in the vein (IV).  This drug will take 30 minutes to infuse.  Possible Side Effects . Tiredness . Fever . Nausea . Decreased appetite (decreased hunger) . Loose bowel movements (diarrhea) . Constipation (not able to move bowels) . Trouble breathing . Rash . Itching . Muscle and bone pain . Cough Note: Each of the side effects above was reported in 20% or greater of patients treated with pembrolizumab. Not all possible side effects are included above.  Warnings and Precautions . This drug works with your immune system and can cause inflammation in any of your organs and tissues and can change how they work. This may put you at risk for developing serious medical problems which can very rarely be fatal. . Colitis (swelling (inflammation) in the colon) - symptoms are loose bowel movements (diarrhea) stomach cramping, and sometimes blood in the bowel movements . Changes in liver function. Your liver function will be checked as needed. . Changes in kidney function, which can very rarely be fatal. Your kidney function will be checked as needed. . Inflammation (swelling) of the lungs which can very rarely be fatal - you may have a dry cough or trouble breathing. . This drug may affect some of your hormone glands (especially the thyroid, adrenals, pituitary and pancreas). Your hormone levels will be checked as needed. . Blood sugar levels may change and you may develop diabetes. If you already have diabetes, changes may need to be made to your diabetes medication. . Severe allergic skin reaction, which can very rarely be fatal. You may develop blisters on your skin that are filled with fluid or a severe red rash all over your body that may be painful. . Increased risk of organ rejection in patients who have received donor organs . Increased risk of complications in patients who will undergo a stem cell transplant  after receiving pembrolizumab. . While you are getting this drug  in your vein (IV), you may have a reaction to the drug. Your nurse will check you closely for these signs: fever or shaking chills, flushing, facial swelling, feeling dizzy, headache, trouble breathing, rash, itching, chest tightness, or chest pain. These reactions may occur after your infusion. If this happens, call 911 for emergency care.  Important Information . This drug may be present in the saliva, tears, sweat, urine, stool, vomit, semen, and vaginal secretions. Talk to your doctor and/or your nurse about the necessary precautions to take during this time.  Treating Side Effects . Ask your doctor or nurse about medicines that are available to help stop or lessen constipation, diarrhea and/or nausea. . Drink plenty of fluids (a minimum of eight glasses per day is recommended). . If you are not able to move your bowels, check with your doctor or nurse before you use any enemas, laxatives, or suppositories . To help with nausea and vomiting, eat small, frequent meals instead of three large meals a day. Choose foods and drinks that are at room temperature. Ask your nurse or doctor about other helpful tips and medicine that is available to help or stop lessen these symptoms. . If you get diarrhea, eat low-fiber foods that are high in protein and calories and avoid foods that can irritate your digestive tracts or lead to cramping. Ask your nurse or doctor about medicine that can lessen or stop your diarrhea. . Manage tiredness by pacing your activities for the day. Be sure to include periods of rest between energy-draining activities . Keeping your pain under control is important to your wellbeing. Please tell your doctor or nurse if you are experiencing pain. . If you have diabetes, keep good control of your blood sugar level. Tell your nurse or your doctor if your glucose levels are higher or lower than normal . If you get a rash do  not put anything on it unless your doctor or nurse says you may. Keep the area around the rash clean and dry. Ask your doctor for medicine if your rash bothers you. . Infusion reactions may happen for 24 hours after your infusion. If this happens, call 911 for emergency care.  Food and Drug Interactions . There are no known interactions of pembrolizumab with food. . There are no known interactions of pembrolizumab with other medications. . Tell your doctor and pharmacist about all the medicines and dietary supplements (vitamins, minerals, herbs and others) that you are taking at this time. The safety and use of dietary supplements and alternative agents are often not known. Using these might affect your cancer or interfere with your treatment. Until more is known, you should not use dietary supplements or alternative agents without your cancer doctor's help.   When to Call the Doctor Call your doctor or nurse if you have any of the following symptoms and/or any new or unusual symptoms: . Fever of 100.5 F (38 C) or higher . Chills . Wheezing or trouble breathing . Rash or itching . Feeling dizzy or lightheaded . Loose bowel movements (diarrhea) more than 4 times a day or diarrhea with weakness or lightheadedness . Nausea that stops you from eating or drinking, and/or that is not relieved by prescribed medicines . Lasting loss of appetite or rapid weight loss of five pounds in a week . Fatigue that interferes with your daily activities . No bowel movement for 3 days or you feel uncomfortable . Extreme weakness that interferes with normal activities . Bad abdominal pain, especially  in upper right area . Decreased urine . Unusual thirst or passing urine often . Rash that is not relieved by prescribed medicines . Flu-like symptoms: fever, headache, muscle and joint aches, and fatigue (low energy, feeling weak) . Signs of liver problems: dark urine, pale bowel movements, bad stomach pain,  feeling very tired and weak, unusual itching, or yellowing of the eyes or skin . Signs of infusion reactions such as fever or shaking chills, flushing, facial swelling, feeling dizzy, headache, trouble breathing, rash, itching, chest tightness, or chest pain. . If you think you are pregnant  Reproduction Warnings . Pregnancy warning: This drug may have harmful effects on the unborn baby. Women of childbearing potential should use effective methods of birth control during your cancer treatment and for at least 4 months after treatment. Let your doctor know right away if you think you may be pregnant . Breast feeding warning: It is not known if this drug passes into breast milk. It is recommended that women do not breastfeed during treatment and for 4 months after treatment. . Fertility warning: Human fertility studies have not been done with this drug. Talk with your doctor or nurse if you plan to have children.  SELF CARE ACTIVITIES WHILE ON CHEMOTHERAPY:  Hydration Increase your fluid intake 48 hours prior to treatment and drink at least 8 to 12 cups (64 ounces) of water/decaffeinated beverages per day after treatment. You can still have your cup of coffee or soda but these beverages do not count as part of your 8 to 12 cups that you need to drink daily. No alcohol intake.  Medications Continue taking your normal prescription medication as prescribed.  If you start any new herbal or new supplements please let us know first to make sure it is safe.  Mouth Care Have teeth cleaned professionally before starting treatment. Keep dentures and partial plates clean. Use soft toothbrush and do not use mouthwashes that contain alcohol. Biotene is a good mouthwash that is available at most pharmacies or may be ordered by calling [phone]. Use warm salt water gargles (1 teaspoon salt per 1 quart warm water) before and after meals and at bedtime. Or you may rinse with 2 tablespoons of three-percent  hydrogen peroxide mixed in eight ounces of water. If you are still having problems with your mouth or sores in your mouth please call the clinic. If you need dental work, please let the doctor know before you go for your appointment so that we can coordinate the best possible time for you in regards to your chemo regimen. You need to also let your dentist know that you are actively taking chemo. We may need to do labs prior to your dental appointment.  Skin Care Always use sunscreen that has not expired and with SPF (Sun Protection Factor) of 50 or higher. Wear hats to protect your head from the sun. Remember to use sunscreen on your hands, ears, face, & feet.  Use good moisturizing lotions such as udder cream, eucerin, or even Vaseline. Some chemotherapies can cause dry skin, color changes in your skin and nails.    . Avoid long, hot showers or baths. . Use gentle, fragrance-free soaps and laundry detergent. . Use moisturizers, preferably creams or ointments rather than lotions because the thicker consistency is better at preventing skin dehydration. Apply the cream or ointment within 15 minutes of showering. Reapply moisturizer at night, and moisturize your hands every time after you wash them.  Hair Loss (if your doctor  says your hair will fall out)  . If your doctor says that your hair is likely to fall out, decide before you begin chemo whether you want to wear a wig. You may want to shop before treatment to match your hair color. . Hats, turbans, and scarves can also camouflage hair loss, although some people prefer to leave their heads uncovered. If you go bare-headed outdoors, be sure to use sunscreen on your scalp. . Cut your hair short. It eases the inconvenience of shedding lots of hair, but it also can reduce the emotional impact of watching your hair fall out. . Don't perm or color your hair during chemotherapy. Those chemical treatments are already damaging to hair and can enhance hair  loss. Once your chemo treatments are done and your hair has grown back, it's OK to resume dyeing or perming hair.  With chemotherapy, hair loss is almost always temporary. But when it grows back, it may be a different color or texture. In older adults who still had hair color before chemotherapy, the new growth may be completely gray.  Often, new hair is very fine and soft.  Infection Prevention Please wash your hands for at least 30 seconds using warm soapy water. Handwashing is the #1 way to prevent the spread of germs. Stay away from sick people or people who are getting over a cold. If you develop respiratory systems such as green/yellow mucus production or productive cough or persistent cough let us know and we will see if you need an antibiotic. It is a good idea to keep a pair of gloves on when going into grocery stores/[employer] to decrease your risk of coming into contact with germs on the carts, etc. Carry alcohol hand gel with you at all times and use it frequently if out in public. If your temperature reaches 100.5 or higher please call the clinic and let us know.  If it is after hours or on the weekend please go to the ER if your temperature is over 100.5.  Please have your own personal thermometer at home to use.    Sex and bodily fluids If you are going to have sex, a condom must be used to protect the person that isn't taking chemotherapy. Chemo can decrease your libido (sex drive). For a few days after chemotherapy, chemotherapy can be excreted through your bodily fluids.  When using the toilet please close the lid and flush the toilet twice.  Do this for a few day after you have had chemotherapy.   Effects of chemotherapy on your sex life Some changes are simple and won't last long. They won't affect your sex life permanently.  Sometimes you may feel: . too tired . not strong enough to be very active . sick or sore  . not in the mood . anxious or low Your anxiety might not seem  related to sex. For example, you may be worried about the cancer and how your treatment is going. Or you may be worried about money, or about how you family are coping with your illness. These things can cause stress, which can affect your interest in sex. It's important to talk to your partner about how you feel. Remember - the changes to your sex life don't usually last long. There's usually no medical reason to stop having sex during chemo. The drugs won't have any long term physical effects on your performance or enjoyment of sex. Cancer can't be passed on to your partner during sex  Contraception It's important to use reliable contraception during treatment. Avoid getting pregnant while you or your partner are having chemotherapy. This is because the drugs may harm the baby. Sometimes chemotherapy drugs can leave a man or woman infertile.  This means you would not be able to have children in the future. You might want to talk to someone about permanent infertility. It can be very difficult to learn that you may no longer be able to have children. Some people find counselling helpful. There might be ways to preserve your fertility, although this is easier for men than for women. You may want to speak to a fertility expert. You can talk about sperm banking or harvesting your eggs. You can also ask about other fertility options, such as donor eggs. If you have or have had breast cancer, your doctor might advise you not to take the contraceptive pill. This is because the hormones in it might affect the cancer.  It is not known for sure whether or not chemotherapy drugs can be passed on through semen or secretions from the vagina. Because of this some doctors advise people to use a barrier method if you have sex during treatment. This applies to vaginal, anal or oral sex. Generally, doctors advise a barrier method only for the time you are actually having the treatment and for about a week after your  treatment. Advice like this can be worrying, but this does not mean that you have to avoid being intimate with your partner. You can still have close contact with your partner and continue to enjoy sex.  Animals If you have cats or birds we just ask that you not change the litter or change the cage.  Please have someone else do this for you while you are on chemotherapy.   Food Safety During and After Cancer Treatment Food safety is important for people both during and after cancer treatment. Cancer and cancer treatments, such as chemotherapy, radiation therapy, and stem cell/bone marrow transplantation, often weaken the immune system. This makes it harder for your body to protect itself from foodborne illness, also called food poisoning. Foodborne illness is caused by eating food that contains harmful bacteria, parasites, or viruses.  Foods to avoid Some foods have a higher risk of becoming tainted with bacteria. These include: Marland Kitchen Unwashed fresh fruit and vegetables, especially leafy vegetables that can hide dirt and other contaminants . Raw sprouts, such as alfalfa sprouts . Raw or undercooked beef, especially ground beef, or other raw or undercooked meat and poultry . Fatty, fried, or spicy foods immediately before or after treatment.  These can sit heavy on your stomach and make you feel nauseous. . Raw or undercooked shellfish, such as oysters. . Sushi and sashimi, which often contain raw fish.  . Unpasteurized beverages, such as unpasteurized fruit juices, raw milk, raw yogurt, or cider . Undercooked eggs, such as soft boiled, over easy, and poached; raw, unpasteurized eggs; or foods made with raw egg, such as homemade raw cookie dough and homemade mayonnaise  Simple steps for food safety  Shop smart. . Do not buy food stored or displayed in an unclean area. . Do not buy bruised or damaged fruits or vegetables. . Do not buy cans that have cracks, dents, or bulges. . Pick up foods that  can spoil at the end of your shopping trip and store them in a cooler on the way home.  Prepare and clean up foods carefully. . Rinse all fresh fruits and vegetables  under running water, and dry them with a clean towel or paper towel. . Clean the top of cans before opening them. . After preparing food, wash your hands for 20 seconds with hot water and soap. Pay special attention to areas between fingers and under nails. . Clean your utensils and dishes with hot water and soap. Marland Kitchen Disinfect your kitchen and cutting boards using 1 teaspoon of liquid, unscented bleach mixed into 1 quart of water.    Dispose of old food. . Eat canned and packaged food before its expiration date (the "use by" or "best before" date). . Consume refrigerated leftovers within 3 to 4 days. After that time, throw out the food. Even if the food does not smell or look spoiled, it still may be unsafe. Some bacteria, such as Listeria, can grow even on foods stored in the refrigerator if they are kept for too long.  Take precautions when eating out. . At restaurants, avoid buffets and salad bars where food sits out for a long time and comes in contact with many people. Food can become contaminated when someone with a virus, often a norovirus, or another "bug" handles it. . Put any leftover food in a "to-go" container yourself, rather than having the server do it. And, refrigerate leftovers as soon as you get home. . Choose restaurants that are clean and that are willing to prepare your food as you order it cooked.   MEDICATIONS:                                                                                                                                                                Compazine/Prochlorperazine 10mg  tablet. Take 1 tablet every 6 hours as needed for nausea/vomiting. (This can make you sleepy)   EMLA cream. Apply a quarter size amount to port site 1 hour prior to chemo. Do not rub in. Cover with plastic  wrap.   Over-the-Counter Meds:  Colace - 100 mg capsules - take 2 capsules daily.  If this doesn't help then you can increase to 2 capsules twice daily.  Call us if this does not help your bowels move.   Imodium 2mg  capsule. Take 2 capsules after the 1st loose stool and then 1 capsule every 2 hours until you go a total of 12 hours without having a loose stool. Call the [facility] if loose stools continue. If diarrhea occurs at bedtime, take 2 capsules at bedtime. Then take 2 capsules every 4 hours until morning. Call [facility].    Diarrhea Sheet   If you are having loose stools/diarrhea, please purchase Imodium and begin taking as outlined:  At the first sign of poorly formed or loose stools you should begin taking Imodium (loperamide) 2 mg capsules.  Take two tablets (4mg )  followed by one tablet (2mg ) every 2 hours - DO NOT EXCEED 8 tablets in 24 hours.  If it is bedtime and you are having loose stools, take 2 tablets at bedtime, then 2 tablets every 4 hours until morning.   Always call the [facility] if you are having loose stools/diarrhea that you can't get under control.  Loose stools/diarrhea leads to dehydration (loss of water) in your body.  We have other options of trying to get the loose stools/diarrhea to stop but you must let us know!   Constipation Sheet  Colace - 100 mg capsules - take 2 capsules daily.  If this doesn't help then you can increase to 2 capsules twice daily.  Please call if the above does not work for you.   Do not go more than 2 days without a bowel movement.  It is very important that you do not become constipated.  It will make you feel sick to your stomach (nausea) and can cause abdominal pain and vomiting.   Nausea Sheet   Compazine/Prochlorperazine 10mg  tablet. Take 1 tablet every 6 hours as needed for nausea/vomiting. (This can make you sleepy)  If you are having persistent nausea (nausea that does not stop) please call the [facility]  and let us know the amount of nausea that you are experiencing.  If you begin to vomit, you need to call the [facility] and if it is the weekend and you have vomited more than one time and can't get it to stop-go to the Emergency Room.  Persistent nausea/vomiting can lead to dehydration (loss of fluid in your body) and will make you feel terrible.   Ice chips, sips of clear liquids, foods that are @ room temperature, crackers, and toast tend to be better tolerated.   SYMPTOMS TO REPORT AS SOON AS POSSIBLE AFTER TREATMENT:   FEVER GREATER THAN 100.5 F  CHILLS WITH OR WITHOUT FEVER  NAUSEA AND VOMITING THAT IS NOT CONTROLLED WITH YOUR NAUSEA MEDICATION  UNUSUAL SHORTNESS OF BREATH  UNUSUAL BRUISING OR BLEEDING  TENDERNESS IN MOUTH AND THROAT WITH OR WITHOUT PRESENCE OF ULCERS  URINARY PROBLEMS  BOWEL PROBLEMS  UNUSUAL RASH      Wear comfortable clothing and clothing appropriate for easy access to any Portacath or PICC line. Let us know if there is anything that we can do to make your therapy better!    What to do if you need assistance after hours or on the weekends: CALL [phone].  HOLD on the line, do not hang up.  You will hear multiple messages but at the end you will be connected with a nurse triage line.  They will contact the doctor if necessary.  Most of the time they will be able to assist you.  Do not call the hospital operator.      I have been informed and understand all of the instructions given to me and have received a copy. I have been instructed to call the clinic [phone] or my family physician as soon as possible for continued medical care, if indicated. I do not have any more questions at this time but understand that I may call the [facility] or the Patient Navigator at [phone] during office hours should I have questions or need assistance in obtaining follow-up care.

## 2019-05-17 ENCOUNTER — Encounter (HOSPITAL_COMMUNITY): Payer: Self-pay | Admitting: *Deleted

## 2019-05-17 ENCOUNTER — Encounter (HOSPITAL_COMMUNITY): Payer: Self-pay

## 2019-05-17 DIAGNOSIS — Z95828 Presence of other vascular implants and grafts: Secondary | ICD-10-CM | POA: Insufficient documentation

## 2019-05-17 HISTORY — DX: Presence of other vascular implants and grafts: Z95.828

## 2019-05-17 MED ORDER — PROCHLORPERAZINE MALEATE 10 MG PO TABS: 10.0000 mg | ORAL_TABLET | Freq: Four times a day (QID) | ORAL | 1 refills | Status: AC | PRN

## 2019-05-17 NOTE — Progress Notes (Signed)
Patient's sister, [name], is helping him with his care. She called the clinic this morning and I spoke with her. I explained the treatment plan to her and what he should expect for side effects.  I talked with her about his schedule before starting treatment as well as a few things he has scheduled or to be scheduled between now and treatment next week.  I talked to her about the need for a port a cath placement. We talked about how she can reach a nurse at any time even after hours. I explained that she can always call and get one of [place] during the day and then a triage line on weekends and holidays.  She verbalizes understanding of all the above. She states that she has Mr. Kuang set up for [employer] transportation and wants [place] to send a schedule to them.  I will fax that schedule to them today.  I provided my contact information so that she can call again should she need any additional information or guidance in Mr. Gleaves care.

## 2019-05-17 NOTE — Addendum Note (Signed)
Addended by: Derek Jack on: 05/17/2019 03:17 PM   Modules accepted: Orders

## 2019-05-17 NOTE — Progress Notes (Signed)
START ON PATHWAY REGIMEN - Non-Small Cell Lung     A cycle is every 21 days:     Pembrolizumab      Pemetrexed      Carboplatin   **Always confirm dose/schedule in your pharmacy ordering system**  Patient Characteristics: Stage IV Metastatic, Nonsquamous, Initial Chemotherapy/Immunotherapy, PS = 0, 1, ALK Rearrangement Negative and EGFR Mutation Negative/Non-Sensitizing, PD-L1 Expression Positive  ? 50% (TPS) and Immunotherapy Candidate AJCC T Category: TX Current Disease Status: Distant Metastases AJCC N Category: NX AJCC M Category: M1c AJCC 8 Stage Grouping: IVB Histology: Nonsquamous Cell ROS1 Rearrangement Status: Negative T790M Mutation Status: Not Applicable - EGFR Mutation Negative/Unknown Other Mutations/Biomarkers: No Other Actionable Mutations NTRK Gene Fusion Status: Negative PD-L1 Expression Status: PD-L1 Positive ? 50% (TPS) Chemotherapy/Immunotherapy LOT: Initial Chemotherapy/Immunotherapy Molecular Targeted Therapy: Not Appropriate ALK Rearrangement Status: Negative EGFR Mutation Status: Negative/Wild Type BRAF [id number] Mutation Status: Negative ECOG Performance Status: 1 Immunotherapy Candidate Status: Candidate for Immunotherapy Intent of Therapy: Non-Curative / Palliative Intent, Discussed with Patient

## 2019-05-18 ENCOUNTER — Other Ambulatory Visit: Payer: Self-pay

## 2019-05-18 ENCOUNTER — Inpatient Hospital Stay (HOSPITAL_COMMUNITY): Payer: Medicaid Other

## 2019-05-18 DIAGNOSIS — C3491 Malignant neoplasm of unspecified part of right bronchus or lung: Secondary | ICD-10-CM

## 2019-05-18 DIAGNOSIS — Z95828 Presence of other vascular implants and grafts: Secondary | ICD-10-CM

## 2019-05-18 MED ORDER — LIDOCAINE-PRILOCAINE 2.5-2.5 % EX CREA: TOPICAL_CREAM | CUTANEOUS | 3 refills | Status: AC

## 2019-05-18 NOTE — Progress Notes (Signed)

## 2019-05-22 ENCOUNTER — Ambulatory Visit (HOSPITAL_COMMUNITY): Payer: Self-pay | Admitting: Hematology

## 2019-05-22 ENCOUNTER — Other Ambulatory Visit (HOSPITAL_COMMUNITY): Payer: Self-pay

## 2019-05-22 ENCOUNTER — Ambulatory Visit (HOSPITAL_COMMUNITY): Payer: Self-pay

## 2019-05-22 ENCOUNTER — Encounter (HOSPITAL_COMMUNITY): Payer: Self-pay | Admitting: *Deleted

## 2019-05-22 NOTE — Progress Notes (Signed)
I spoke with patient today in great detail about the recommendations that Dr. Delton Coombes presented to him last week.  I told him that Dr. Raliegh Ip feels that he has a great chance of responding. His specialized testing came back at 70% which shows that his cancer will respond to the treatments.  Patient states that he has thought about it and "I just don't think I will make it through all of this.  I just don't want to do it".  I talked with him about still going to palliative radiation for pain management and symptom relief and he states " I just don't want to do anything, I don't want chemo and I don't want radiation".    I verbalized my understanding of what I heard the patient to say, no chemotherapy and no radiation.  He says yes I just want Hospice.    I explained the referral process of Hospice and that they would be contacting him in the next day or so to schedule an in home visit.  He verbalizes understanding.

## 2019-05-23 ENCOUNTER — Ambulatory Visit: Payer: Self-pay | Admitting: General Surgery

## 2019-05-26 ENCOUNTER — Encounter (HOSPITAL_COMMUNITY): Payer: Self-pay

## 2019-05-29 ENCOUNTER — Ambulatory Visit: Payer: Self-pay | Admitting: Physician Assistant

## 2019-06-12 ENCOUNTER — Ambulatory Visit (HOSPITAL_COMMUNITY): Payer: Self-pay

## 2019-06-12 ENCOUNTER — Other Ambulatory Visit (HOSPITAL_COMMUNITY): Payer: Self-pay

## 2019-06-12 ENCOUNTER — Ambulatory Visit (HOSPITAL_COMMUNITY): Payer: Self-pay | Admitting: Hematology

## 2019-06-27 DEATH — deceased

## 2019-07-12 ENCOUNTER — Ambulatory Visit: Payer: Self-pay | Admitting: Physical Medicine & Rehabilitation
